# Patient Record
Sex: Female | Born: 1975 | Race: White | Hispanic: No | Marital: Married | Smoking: Former smoker
Health system: Southern US, Community
[De-identification: ages and names within clinical notes are randomized; demographics above are authoritative.]

## PROBLEM LIST (undated history)

## (undated) DIAGNOSIS — R000 Tachycardia, unspecified: Secondary | ICD-10-CM

## (undated) DIAGNOSIS — E785 Hyperlipidemia, unspecified: Secondary | ICD-10-CM

## (undated) DIAGNOSIS — F419 Anxiety disorder, unspecified: Secondary | ICD-10-CM

## (undated) DIAGNOSIS — N201 Calculus of ureter: Secondary | ICD-10-CM

## (undated) DIAGNOSIS — K439 Ventral hernia without obstruction or gangrene: Secondary | ICD-10-CM

## (undated) DIAGNOSIS — Z87442 Personal history of urinary calculi: Secondary | ICD-10-CM

## (undated) HISTORY — DX: Ventral hernia without obstruction or gangrene: K43.9

## (undated) HISTORY — PX: DILATION AND CURETTAGE OF UTERUS: SHX78

## (undated) HISTORY — PX: MANDIBLE SURGERY: SHX707

## (undated) HISTORY — DX: Anxiety disorder, unspecified: F41.9

## (undated) HISTORY — PX: CERVICAL BIOPSY  W/ LOOP ELECTRODE EXCISION: SUR135

## (undated) HISTORY — DX: Tachycardia, unspecified: R00.0

---
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
## 2003-05-28 ENCOUNTER — Ambulatory Visit (HOSPITAL_COMMUNITY): Admission: RE | Admit: 2003-05-28 | Discharge: 2003-05-28 | Payer: Self-pay | Admitting: Obstetrics and Gynecology

## 2003-05-28 ENCOUNTER — Encounter (INDEPENDENT_AMBULATORY_CARE_PROVIDER_SITE_OTHER): Payer: Self-pay | Admitting: *Deleted

## 2003-10-29 ENCOUNTER — Other Ambulatory Visit: Admission: RE | Admit: 2003-10-29 | Discharge: 2003-10-29 | Payer: Self-pay | Admitting: Obstetrics and Gynecology

## 2004-03-03 ENCOUNTER — Other Ambulatory Visit: Admission: RE | Admit: 2004-03-03 | Discharge: 2004-03-03 | Payer: Self-pay | Admitting: Obstetrics and Gynecology

## 2004-06-06 ENCOUNTER — Encounter (INDEPENDENT_AMBULATORY_CARE_PROVIDER_SITE_OTHER): Payer: Self-pay | Admitting: Specialist

## 2004-06-06 ENCOUNTER — Ambulatory Visit (HOSPITAL_COMMUNITY): Admission: RE | Admit: 2004-06-06 | Discharge: 2004-06-06 | Payer: Self-pay | Admitting: Obstetrics and Gynecology

## 2004-07-07 ENCOUNTER — Other Ambulatory Visit: Admission: RE | Admit: 2004-07-07 | Discharge: 2004-07-07 | Payer: Self-pay | Admitting: Obstetrics and Gynecology

## 2004-12-29 ENCOUNTER — Other Ambulatory Visit: Admission: RE | Admit: 2004-12-29 | Discharge: 2004-12-29 | Payer: Self-pay | Admitting: Obstetrics and Gynecology

## 2005-07-20 ENCOUNTER — Ambulatory Visit (HOSPITAL_COMMUNITY): Admission: RE | Admit: 2005-07-20 | Discharge: 2005-07-20 | Payer: Self-pay | Admitting: Obstetrics and Gynecology

## 2005-07-23 ENCOUNTER — Inpatient Hospital Stay (HOSPITAL_COMMUNITY): Admission: AD | Admit: 2005-07-23 | Discharge: 2005-07-24 | Payer: Self-pay | Admitting: Obstetrics and Gynecology

## 2005-10-12 ENCOUNTER — Inpatient Hospital Stay (HOSPITAL_COMMUNITY): Admission: AD | Admit: 2005-10-12 | Discharge: 2005-10-14 | Payer: Self-pay | Admitting: Obstetrics and Gynecology

## 2009-09-18 LAB — CONVERTED CEMR LAB

## 2010-04-21 ENCOUNTER — Ambulatory Visit: Payer: Self-pay | Admitting: Family Medicine

## 2010-04-21 DIAGNOSIS — R000 Tachycardia, unspecified: Secondary | ICD-10-CM

## 2010-04-21 DIAGNOSIS — F411 Generalized anxiety disorder: Secondary | ICD-10-CM | POA: Insufficient documentation

## 2010-04-21 DIAGNOSIS — E785 Hyperlipidemia, unspecified: Secondary | ICD-10-CM | POA: Insufficient documentation

## 2010-04-21 DIAGNOSIS — G4700 Insomnia, unspecified: Secondary | ICD-10-CM | POA: Insufficient documentation

## 2010-04-21 HISTORY — DX: Tachycardia, unspecified: R00.0

## 2010-04-28 ENCOUNTER — Ambulatory Visit: Payer: Self-pay | Admitting: Family Medicine

## 2010-06-07 ENCOUNTER — Encounter: Payer: Self-pay | Admitting: Family Medicine

## 2010-06-20 ENCOUNTER — Ambulatory Visit
Admission: RE | Admit: 2010-06-20 | Discharge: 2010-06-20 | Payer: Self-pay | Source: Home / Self Care | Attending: Family Medicine | Admitting: Family Medicine

## 2010-06-20 NOTE — Assessment & Plan Note (Signed)
Summary: New pt [facility]/dt   Vital Signs:  Patient profile:   35 year old female Menstrual status:  regular LMP:     04/04/2010 Height:      62.75 inches (159.38 cm) Weight:      117.25 pounds (53.30 kg) BMI:     21.01 O2 Sat:      100 % on Room air Temp:     97.7 degrees F (36.50 degrees C) oral Pulse rate:   153 / minute BP sitting:   128 / 84  (right arm) Cuff size:   regular  Vitals Entered By: Josph Macho RMA (April 21, 2010 3:00 PM)  O2 Flow:  Room air  Serial Vital Signs/Assessments:  Time      Position  BP       Pulse  Resp  Temp     By                     134/90                         Danise Edge MD  CC: Establish new patient/ questions about anxiety medication/ CF Is Patient Diabetic? No LMP (date): 04/04/2010     Menstrual Status regular Enter LMP: 04/04/2010 Last PAP Result historical   History of Present Illness: Patient is 35 yo caucasian female in today for new patient appt for evaluation of worsening anxiety disorder. She is accompanied by her husband who confirms her symptoms are worsening. She acknowledges having some level of anxiety she was always been able to manage as a young adult she had hi anxiety but no actual "attacks". She reports the first attack was really in 2003 when her Emelia Loron was hospital ized. She became sweaty, presyncopal, had  tunnel vision, headache and palpitations. No other attack until occur September of 2006. In January 2006 she suffered a miscarriage then in September 2006 she had a pregnancy and had an attack at that time. She delivered a child in May of 2007 and since then she's had intermittent attacks and in the last 2 months they been much more frequent. she reports she'll be she'll tinnitus and anxious. She worries constantly. She has trouble sleeping both falling and staying asleep. With these episodes she deniesrtness of breath, chest pain, nausea, paresthesias. Does have a feeling of her health heart pounding  racing, has never had a syncopal episode but feels as if her vision is graying and she could pass out. Often feels diaphoretic. She reports otherwise her health is good she's not had any recent febrile illness, congestion, cough, GI or GU concerns.  Preventive Screening-Counseling & Management  Alcohol-Tobacco     Smoking Status: quit  Caffeine-Diet-Exercise     Does Patient Exercise: no  Problems Prior to Update: 1)  Tachycardia  (ICD-785.0) 2)  Hyperlipidemia  (ICD-272.4) 3)  Anxiety Disorder  (ICD-300.00) 4)  Insomnia  (ICD-780.52)  Current Problems (verified): 1)  Tachycardia  (ICD-785.0) 2)  Hyperlipidemia  (ICD-272.4) 3)  Anxiety Disorder  (ICD-300.00) 4)  Insomnia  (ICD-780.52)  Medications Prior to Update: 1)  None  Current Medications (verified): 1)  Ortho Tri-Cyclen (28) 0.18/0.215/0.25 Mg-35 Mcg Tabs (Norgestim-Eth Estrad Triphasic) .... Once Daily 2)  Multi Vitamin .... Once Daily 3)  Flax Seed Oil .... Once Daily 4)  Fish Oil .... Once Daily  Allergies (verified): 1)  ! 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 Quit heavy smoking in 2006 Alcohol use-yes, rare, special occasions Regular exercise-no Dietary, no restrictions Use seat belt regularlyOccupation:  employed Smoking Status:  quit Does Patient Exercise:  no  Review of Systems  The patient denies anorexia, fever, weight loss, weight gain, vision loss, decreased hearing, hoarseness, chest pain, syncope, dyspnea on exertion, peripheral edema, prolonged cough, headaches, hemoptysis, abdominal pain, melena, hematochezia, severe indigestion/heartburn, hematuria, incontinence, muscle weakness, suspicious skin lesions, transient blindness, difficulty walking, depression, unusual weight change, abnormal bleeding, and enlarged lymph nodes.    Physical Exam  General:  Well-developed,well-nourished,in no acute distress; alert,appropriate and cooperative throughout examination Head:  Normocephalic and atraumatic without obvious abnormalities. No apparent alopecia or balding. Eyes:  No corneal or conjunctival inflammation noted. EOMI. Perrla. Funduscopic exam benign, without hemorrhages, exudates or papilledema. Vision grossly normal. Ears:  External ear exam shows no significant lesions or deformities.  Otoscopic examination reveals clear canals, tympanic membranes are intact bilaterally without bulging, retraction, inflammation or discharge. Hearing is grossly normal bilaterally. Nose:  External nasal examination shows no deformity or inflammation. Nasal mucosa are pink and moist without lesions or exudates. Mouth:  Oral mucosa and  oropharynx without lesions or exudates.  Teeth in good repair. Neck:  No deformities, masses, or tenderness noted. Lungs:  Normal respiratory effort, chest expands symmetrically. Lungs are clear to auscultation, no crackles or wheezes. Heart:  Normal rate and regular rhythm. S1 and S2 normal without gallop, murmur, click, rub or other extra sounds. Abdomen:  Bowel sounds positive,abdomen soft and non-tender without masses, organomegaly or hernias noted. Msk:  No deformity or scoliosis noted of thoracic or lumbar spine.   Pulses:  R and L carotid,radial,femoral,dorsalis pedis and posterior tibial pulses are full and equal bilaterally Extremities:  No clubbing, cyanosis, edema, or deformity noted  Neurologic:  No cranial nerve deficits noted. Station and gait are normal. Plantar reflexes are down-going bilaterally. DTRs are symmetrical throughout. Sensory, motor and coordinative functions appear intact. Skin:  Intact without suspicious lesions or rashes Cervical Nodes:  No lymphadenopathy noted Psych:  Cognition and judgment appear intact. Alert and cooperative with normal attention span and concentration. No apparent delusions, illusions, hallucinations, moderately anxious.     Impression & Recommendations:  Problem # 1:  TACHYCARDIA (ICD-785.0)  Orders: T-Basic Metabolic Panel ([id number]) T-CBC No Diff ([id number]) T-TSH ([id number]) Start Metoprolol Succinate 25mg  by mouth daily  Problem # 2:  ANXIETY DISORDER (ICD-300.00)  Her updated medication list for this problem includes:  Alprazolam 0.25 Mg Tabs (Alprazolam) .[name] Kitchen... 1/2 to 1 tab by mouth two times a day as needed anxiety Will start Alprazolam 0.25 mg by mouth as needed for now and reeval next week. Patient encouraged to consider SSRI and behavioral modification.  Problem # 3:  INSOMNIA (ICD-780.52) Encouraged to try Benadryl and may use Alprazolam as needed for intense episodes  Problem # 4:  HYPERLIPIDEMIA  (ICD-272.4)  Orders: T-Lipid Profile ([id number]) T-Hepatic Function ([id number]) Will reeval after labs available.  Complete Medication List: 1)  Ortho Tri-cyclen (28) 0.18/0.215/0.25 Mg-35 Mcg Tabs (Norgestim-eth estrad triphasic) .... Once daily 2)  Multi Vitamin  .... Once daily 3)  Flax Seed Oil  .... Once daily 4)  Fish Oil  .... Once daily 5)  Alprazolam 0.25 Mg Tabs (Alprazolam) .... 1/2 to 1 tab by mouth two times a day as needed anxiety 6)  Metoprolol Succinate 25 Mg Xr24h-tab (Metoprolol succinate) .[name] Kitchen.. 1 tab by mouth daily  Patient Instructions: 1)  Please schedule a follow-up appointment in 1 weeks 2)  Avoid caffeine 3)  Try Benadryl as needed for sleep. Prescriptions: METOPROLOL SUCCINATE 25 MG XR24H-TAB (METOPROLOL SUCCINATE) 1 tab by mouth daily  #30 x 1   Entered and Authorized by:   Danise Edge MD   Signed by:   Danise Edge MD on 04/21/2010   Method used:   Electronically to        [facility]  [address] [phone][facility] (retail)       [address]       [place], [place]  [address]       Ph: [phone] or [phone]       Fax: [phone]   RxID:   [id number] ALPRAZOLAM 0.25 MG TABS (ALPRAZOLAM) 1/2 to 1 tab by mouth two times a day as needed anxiety  #30 x 1   Entered and Authorized by:   Danise Edge MD   Signed by:   Danise Edge MD on 04/21/2010   Method used:   Print then Give to Patient   RxID:   [id number]    Orders Added: 1)  T-Basic Metabolic Panel [[id number]] 2)  T-Lipid Profile [54627-03500] 3)  T-Hepatic Function [[id number]] 4)  T-CBC No Diff [[id number]] 5)  T-TSH [[id number]] 6)  New Patient Level IV [27782]    Preventive Care Screening  Pap Smear:    Date:  09/18/2009    Results:  historical

## 2010-06-20 NOTE — Assessment & Plan Note (Signed)
Summary: 1 week fu/dt   Vital Signs:  Patient profile:   35 year old female Menstrual status:  regular Height:      62.75 inches (159.38 cm) Weight:      118.50 pounds (53.86 kg) O2 Sat:      98 % on Room air Temp:     97.7 degrees F (36.50 degrees C) oral Pulse rate:   136 / minute BP sitting:   136 / 81  (right arm) Cuff size:   regular  Vitals Entered By: Josph Macho RMA (April 28, 2010 3:26 PM)  O2 Flow:  Room air  CC: 1 week follow up/ CF Is Patient Diabetic? No   History of Present Illness: Patient is in today for reevaluation of tachycardia and anxiety. She reports she is doing much better on the Metoprolol. She has not had any panic or anxiety attacks this week. She took 1/2 of a Xanax during th week for a stressful situation but she was not having the palpitations at that time. She has not had any palp/sob/cp/tremulousness and she does feel a little calmer on the Metoprolol. Denies any SE such as constipation or fatigue. On the way here she acknowledges she could feel her anxiety level and heart rate pick up secondary to her worry about what her Vital signs would be when she got here. No recent illness/f/c/GI or GU c/o.  Current Medications (verified): 1)  Ortho Tri-Cyclen (28) 0.18/0.215/0.25 Mg-35 Mcg Tabs (Norgestim-Eth Estrad Triphasic) .... Once Daily 2)  Multi Vitamin .... Once Daily 3)  Flax Seed Oil .... Once Daily 4)  Fish Oil .... Once Daily 5)  Alprazolam 0.25 Mg Tabs (Alprazolam) .... 1/2 To 1 Tab By Mouth Two Times A Day As Needed Anxiety 6)  Metoprolol Succinate 25 Mg Xr24h-Tab (Metoprolol Succinate) .[name] Kitchen.. 1 Tab By Mouth Daily  Allergies (verified): 1)  ! Sulfa  Past History:  Past medical history reviewed for relevance to current acute and chronic problems. Social history (including risk factors) reviewed for relevance to current acute and chronic problems.  Social History: Reviewed history from 04/21/2010 and no changes required. 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 Quit heavy smoking in 2006 Alcohol use-yes, rare, special occasions Regular exercise-no Dietary, no restrictions Use seat belt regularly  Review of Systems      See HPI  Physical Exam  General:  Well-developed,well-nourished,in no acute distress; alert,appropriate and cooperative throughout examination Head:  Normocephalic and atraumatic without obvious abnormalities. No apparent alopecia or balding. Ears:  External ear exam shows no significant lesions or deformities.   Nose:  External nasal examination shows no deformity or inflammation. Nasal mucosa are pink and moist without lesions or exudates. Mouth:  Oral mucosa and oropharynx without lesions or exudates.  Teeth in good repair. Neck:  No deformities, masses, or tenderness noted. Lungs:  Normal respiratory effort, chest expands symmetrically. Lungs are clear to auscultation, no crackles or wheezes. Heart:  Normal rate and regular rhythm. S1 and S2 normal without gallop, murmur, click, rub or other extra sounds. Abdomen:  Bowel sounds positive,abdomen soft and non-tender without masses, organomegaly or hernias noted. Msk:  No deformity or scoliosis noted of thoracic or lumbar spine.   Pulses:  R and L carotid,radial,femoral,dorsalis pedis and posterior tibial pulses are full and equal bilaterally Extremities:  No clubbing, cyanosis, edema, or deformity noted  Cervical Nodes:  No lymphadenopathy noted Psych:  Cognition and judgment appear intact.  Alert and cooperative with normal attention span and concentration. No apparent delusions, illusions, hallucinations   Impression & Recommendations:  Problem # 1:  ANXIETY DISORDER (ICD-300.00)  Her updated medication list for this problem includes:    Alprazolam 0.25 Mg Tabs (Alprazolam) .[name] Kitchen... 1/2 to 1 tab by mouth two times a day as needed anxiety    Sertraline Hcl 50 Mg Tabs (Sertraline  hcl) .[name] Kitchen... 1/2 tab by mouth daily x 7 days then increase to 1 tab daily Patient will call with any concerns or return in 6 weeks for further evaluation  Problem # 2:  TACHYCARDIA (ICD-785.0) Improved on Metoprolol will not increase dosing today and will continue to monitor  Complete Medication List: 1)  Ortho Tri-cyclen (28) 0.18/0.215/0.25 Mg-35 Mcg Tabs (Norgestim-eth estrad triphasic) .... Once daily 2)  Multi Vitamin  .... Once daily 3)  Flax Seed Oil  .... Once daily 4)  Fish Oil  .... Once daily 5)  Alprazolam 0.25 Mg Tabs (Alprazolam) .... 1/2 to 1 tab by mouth two times a day as needed anxiety 6)  Metoprolol Succinate 25 Mg Xr24h-tab (Metoprolol succinate) .[name] Kitchen.. 1 tab by mouth daily 7)  Sertraline Hcl 50 Mg Tabs (Sertraline hcl) .... 1/2 tab by mouth daily x 7 days then increase to 1 tab daily  Patient Instructions: 1)  Please schedule a follow-up appointment in 6-8 weeks.  2)  Call with any concerns. Prescriptions: METOPROLOL SUCCINATE 25 MG XR24H-TAB (METOPROLOL SUCCINATE) 1 tab by mouth daily  #30 x 2   Entered and Authorized by:   Danise Edge MD   Signed by:   Danise Edge MD on 04/28/2010   Method used:   [facility] to        [facility]  [address] [phone][facility]       [address] [place], [place]  [address]       Ph: [phone] or [phone]       Fax: [phone]   RxID:   [id number] SERTRALINE HCL 50 MG TABS (SERTRALINE HCL) 1/2 tab by mouth daily x 7 days then increase to 1 tab daily  #30 x 2   Entered and Authorized by:   Danise Edge MD   Signed by:   Danise Edge MD on 04/28/2010   Method used:   [facility]  [address] [phone][facility]       [address] [place], [place]  [address]       Ph: [phone] or [phone]       Fax: [phone]   RxID:   [id number]

## 2010-06-28 NOTE — Assessment & Plan Note (Signed)
Summary: 6 week fu/dt   Vital Signs:  Patient profile:   35 year old female Menstrual status:  regular Height:      62.75 inches (159.38 cm) Weight:      121.50 pounds (55.23 kg) O2 Sat:      100 % on Room air Temp:     98.1 degrees F (36.72 degrees C) oral Pulse rate:   100 / minute BP sitting:   123 / 81  (right arm) Cuff size:   regular  Vitals Entered By: Josph Macho RMA (June 20, 2010 3:30 PM)  O2 Flow:  Room air  Serial Vital Signs/Assessments:  Time      Position  BP       Pulse  Resp  Temp     By                     101/62   90                    Danise Edge MD  CC: 6 week follow up/ CF Is Patient Diabetic? No   History of Present Illness: Patient is a 35 yo Caucasian female in today for follow up on tachycardia and anxiety. She reports feeling much better on this combination of medications. She feels much less anxious and is able to tolerate situations that would have made her very anxious and tachycardic in the past much better now. She had no anxiety coming in here today. She is happier and able to concentrate better now. She does still struggle with difficulty sleeping at times. Both falling asleep and staying asleep. No recent illness/congestion/fevers/chills/fatigue/CP/palp/SOB/GI or GU concerns.  Current Medications (verified): 1)  Ortho Tri-Cyclen (28) 0.18/0.215/0.25 Mg-35 Mcg Tabs (Norgestim-Eth Estrad Triphasic) .... Once Daily 2)  Multi Vitamin .... Once Daily 3)  Flax Seed Oil .... Once Daily 4)  Fish Oil .... Once Daily 5)  Alprazolam 0.25 Mg Tabs (Alprazolam) .... 1/2 To 1 Tab By Mouth Two Times A Day As Needed Anxiety 6)  Metoprolol Succinate 25 Mg Xr24h-Tab (Metoprolol Succinate) .[name] Kitchen.. 1 Tab By Mouth Daily 7)  Sertraline Hcl 50 Mg Tabs (Sertraline Hcl) .... 1/2 Tab By Mouth Daily X 7 Days Then Increase To 1 Tab Daily  Allergies (verified): 1)  ! Sulfa  Past History:  Past medical history reviewed for relevance to current acute and chronic  problems. Social history (including risk factors) reviewed for relevance to current acute and chronic problems.  Social History: Reviewed history from 04/21/2010 and no changes required. 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 Quit heavy smoking in 2006 Alcohol use-yes, rare, special occasions Regular exercise-no Dietary, no restrictions Use seat belt regularly  Review of Systems      See HPI  Physical Exam  General:  Well-developed,well-nourished,in no acute distress; alert,appropriate and cooperative throughout examination Head:  Normocephalic and atraumatic without obvious abnormalities. No apparent alopecia or balding. Nose:  External nasal examination shows no deformity or inflammation. Nasal mucosa are pink and moist without lesions or exudates. Mouth:  Oral mucosa and oropharynx without lesions or exudates.  Teeth in good repair. Neck:  No deformities, masses, or tenderness noted. Lungs:  Normal respiratory effort, chest expands symmetrically. Lungs are clear to auscultation, no crackles or wheezes.no crackles.   Heart:  Normal rate and regular rhythm. S1 and S2 normal without gallop, murmur, click, rub or other extra sounds. Abdomen:  Bowel sounds  positive,abdomen soft and non-tender without masses, organomegaly or hernias noted. Extremities:  No clubbing, cyanosis, edema, or deformity noted with normal full range of motion of all joints.   Cervical Nodes:  No lymphadenopathy noted Psych:  Cognition and judgment appear intact. Alert and cooperative with normal attention span and concentration. No apparent delusions, illusions, hallucinations   Impression & Recommendations:  Problem # 1:  TACHYCARDIA (ICD-785.0) Greatly improved on the combination of Sertraline and Metoprolol,  no change in therapy today.   Problem # 2:  ANXIETY DISORDER (ICD-300.00)  Her updated medication list for this problem  includes:    Alprazolam 0.25 Mg Tabs (Alprazolam) .[name] Kitchen... 1/2 to 1 tab by mouth two times a day as needed anxiety    Sertraline Hcl 50 Mg Tabs (Sertraline hcl) .[name] Kitchen... 1/2 tab by mouth daily x 7 days then increase to 1 tab daily Doing much better, reports no panic attacks and she notes being much less anxious while coming in here today. She may use he rAlprazolam sparingly for nights she has excessive trouble sleeping.   Complete Medication List: 1)  Ortho Tri-cyclen (28) 0.18/0.215/0.25 Mg-35 Mcg Tabs (Norgestim-eth estrad triphasic) .... Once daily 2)  Multi Vitamin  .... Once daily 3)  Flax Seed Oil  .... Once daily 4)  Fish Oil  .... Once daily 5)  Alprazolam 0.25 Mg Tabs (Alprazolam) .... 1/2 to 1 tab by mouth two times a day as needed anxiety 6)  Metoprolol Succinate 25 Mg Xr24h-tab (Metoprolol succinate) .[name] Kitchen.. 1 tab by mouth daily 7)  Sertraline Hcl 50 Mg Tabs (Sertraline hcl) .... 1/2 tab by mouth daily x 7 days then increase to 1 tab daily  Patient Instructions: 1)  Please schedule a follow-up appointment in 2 to 3 months.  2)  Call if any concerns. 3)  If sweating persists try to check BP upon awakening, if lower than 110/60, call Prescriptions: METOPROLOL SUCCINATE 25 MG XR24H-TAB (METOPROLOL SUCCINATE) 1 tab by mouth daily  #30 x 2   Entered and Authorized by:   Danise Edge MD   Signed by:   Danise Edge MD on 06/20/2010   Method used:   [facility] to        [facility]  [address] [phone]* (retail)       [address] [place], [place]  [address]       Ph: [phone] or [phone]       Fax: [phone]   RxID:   [id number]    Orders Added: 1)  Est. Patient Level III [[address]]

## 2010-06-29 ENCOUNTER — Encounter: Payer: Self-pay | Admitting: Family Medicine

## 2010-07-05 ENCOUNTER — Encounter: Payer: Self-pay | Admitting: *Deleted

## 2010-07-31 ENCOUNTER — Encounter: Payer: Self-pay | Admitting: Family Medicine

## 2010-08-29 ENCOUNTER — Ambulatory Visit: Payer: Self-pay | Admitting: Family Medicine

## 2010-09-05 ENCOUNTER — Encounter: Payer: Self-pay | Admitting: Family Medicine

## 2010-09-05 ENCOUNTER — Ambulatory Visit (INDEPENDENT_AMBULATORY_CARE_PROVIDER_SITE_OTHER): Payer: BLUE CROSS/BLUE SHIELD | Admitting: Family Medicine

## 2010-09-05 DIAGNOSIS — F411 Generalized anxiety disorder: Secondary | ICD-10-CM

## 2010-09-05 DIAGNOSIS — G4700 Insomnia, unspecified: Secondary | ICD-10-CM

## 2010-09-05 DIAGNOSIS — E785 Hyperlipidemia, unspecified: Secondary | ICD-10-CM

## 2010-09-05 DIAGNOSIS — F419 Anxiety disorder, unspecified: Secondary | ICD-10-CM

## 2010-09-05 DIAGNOSIS — R000 Tachycardia, unspecified: Secondary | ICD-10-CM

## 2010-09-05 MED ORDER — METOPROLOL SUCCINATE ER 25 MG PO TB24: 25.0000 mg | ORAL_TABLET | Freq: Every day | ORAL | Status: DC

## 2010-09-05 MED ORDER — SERTRALINE HCL 50 MG PO TABS: 50.0000 mg | ORAL_TABLET | Freq: Every day | ORAL | Status: DC

## 2010-09-05 MED ORDER — ALPRAZOLAM 0.25 MG PO TABS: 0.2500 mg | ORAL_TABLET | ORAL | Status: DC | PRN

## 2010-09-05 NOTE — Assessment & Plan Note (Signed)
This continues to plague her. She does use Alprazolam ocassionally for this purpose and it does help. They do have a 70 week old puppy and that has been disrupting her sleep lately too. She will try some melatonin OTC, if no response may consider Benadryl or Hyland's Calms Forte and she already trying to maintain good sleep hygiene. May use Alprazolam infrequently

## 2010-09-05 NOTE — Assessment & Plan Note (Signed)
Improved with blood draw in past year, encouraged her to continue to avoid trans fats and maintain a healthy diet and exercise regimen, recheck every 1-2 years unless something changes

## 2010-09-05 NOTE — Patient Instructions (Signed)
Anxiety and Panic Attacks Your caregiver has informed you that you are having an anxiety or panic attack. There may be many forms of this. Most of the time these attacks come suddenly and without warning. They come at any time of day, including periods of sleep, and at any time of life. They may be strong and unexplained. Although panic attacks are very scary, they are physically harmless. Sometimes the cause of your anxiety is not known. Anxiety is a protective mechanism of the body in its fight or flight mechanism. Most of these perceived danger situations are actually nonphysical situations (such as anxiety over losing a job). CAUSES The causes of an anxiety or panic attack are many. Panic attacks may occur in otherwise healthy people given a certain set of circumstances. There may be a genetic cause for panic attacks. Some medications may also have anxiety as a side effect. SYMPTOMS Some of the most common feelings are:  Intense terror.  Dizziness, feeling faint.   Hot and cold flashes.   Fear of going crazy.   Feelings that nothing is real.   Sweating.   Shaking.   Chest pain or a fast heartbeat (palpitations).  Smothering, choking sensations.   Feelings of impending doom and that death is near.   Tingling of extremities, this may be from over breathing.   Altered reality (derealization).   Being detached from yourself (depersonalization).   Several symptoms can be present to make up anxiety or panic attacks. DIAGNOSIS The evaluation by your caregiver will depend on the type of symptoms you are experiencing. The diagnosis of anxiety or pain attack is made when no physical illness can be determined to be a cause of the symptoms. TREATMENT Treatment to prevent anxiety and panic attacks may include:  Avoidance of circumstances that cause anxiety.   Reassurance and relaxation.   Regular exercise.   Relaxation therapies, such as yoga.   Psychotherapy with a psychiatrist  or therapist.   Avoidance of caffeine, alcohol and illegal drugs.   Prescribed medication.  SEEK IMMEDIATE MEDICAL CARE IF:  You experience panic attack symptoms that are different than your usual symptoms.   You have any worsening or concerning symptoms.  Document Released: 05/07/2005 Document Re-Released: 10/25/2009 [employer] Patient Information 2011 [employer]. 

## 2010-09-05 NOTE — Progress Notes (Signed)
Vickie Bennett [id number] 1975/10/23 09/05/2010      Progress Note-Follow Up  Subjective  Chief Complaint  Chief Complaint  Patient presents with  . Anxiety    2 month follow up    HPI  Patient is a 35 year old Caucasian female in today for evaluation of tachycardia and anxiety. She reports feeling very well and having no difficulties with anxiety or panic attacks since her last visit. She has been taking Propulsid medication at her local pharmacy and find the numbers in the 60-90 range on average. She denies any palpitations, chest pain, shortness of breath or panic attacks. She denies any recent illness, fevers, URI or GI concerns at this time. She feels the combination of her medications is working well and she is not acuity changes. Her only concern is of persistent poor sleep. Of note she has recently gotten a young puppy and that is affecting her sleep as well she uses her alprazolam at times to help her sleep and that is somewhat useful. She finds she has trouble falling asleep and staying asleep on most nights. She has not tried any over-the-counter medications to date. She does try and maintain a quiet dark room and avoids stimulants in the evenings.  Past Medical History  Diagnosis Date  . Anxiety   . Hypertension   . TACHYCARDIA 04/21/2010  . INSOMNIA 04/21/2010  . HYPERLIPIDEMIA 04/21/2010  . ANXIETY DISORDER 04/21/2010    Past Surgical History  Procedure Date  . Cesarean section 2007  . Leep 2005  . Dilation and curettage of uterus     incomplete miscarriage  . 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 Hypertension Father   . Arthritis Father   . Migraines Mother   . Interstitial cystitis Mother   . Ulcers Maternal Grandmother     Peptic  . Anemia Maternal Grandmother   . Heart disease Maternal Grandfather   . Heart disease Paternal Grandfather   . Alzheimer's disease Paternal Grandmother   .  Other Paternal Grandmother     Possible PE    History   Social History  . Marital Status: Single    Spouse Name: N/A    Number of Children: N/A  . Years of Education: N/A   Occupational History  . Not on file.   Social History Main Topics  . Smoking status: Former Smoker    Quit date: 02/05/2009  . Smokeless tobacco: Never Used  . Alcohol Use: 0.0 oz/week    0 drink(s) per week     occasionaly/ very rare  . Drug Use: No  . Sexually Active: Yes -- Female partner(s)   Other Topics Concern  . Not on file   Social History Narrative  . No narrative on file    Current Outpatient Prescriptions on File Prior to Visit  Medication Sig Dispense Refill  . Multiple Vitamin (MULTIVITAMIN) tablet Take 1 tablet by mouth daily.        . norethindrone-ethinyl estradiol (TRIPHASIL) 0.5/0.75/1-35 MG-MCG per tablet Take 1 tablet by mouth daily.        [name] Kitchen DISCONTD: ALPRAZolam (XANAX) 0.25 MG tablet Take 0.25 mg by mouth as needed. 1/2 to 1 tab  For anxiety       . DISCONTD: metoprolol (TOPROL-XL) 25 MG 24 hr tablet Take 25 mg by mouth daily.        [name] Kitchen DISCONTD: sertraline (ZOLOFT) 50  MG tablet Take 50 mg by mouth daily.        . fish oil-omega-3 fatty acids 1000 MG capsule Take 1,000 mg by mouth daily.        . Flaxseed, Linseed, (FLAX SEED OIL PO) Take by mouth daily.          Allergies  Allergen Reactions  . Sulfonamide Derivatives     Review of Systems  Review of Systems  Constitutional: Negative for fever and malaise/fatigue.  HENT: Negative for congestion.   Eyes: Negative for discharge.  Respiratory: Negative for shortness of breath.   Cardiovascular: Negative for chest pain, palpitations and leg swelling.  Gastrointestinal: Negative for nausea, abdominal pain and diarrhea.  Genitourinary: Negative for dysuria.  Musculoskeletal: Negative for falls.  Skin: Negative for rash.  Neurological: Negative for loss of consciousness and headaches.  Endo/Heme/Allergies: Negative for  polydipsia.  Psychiatric/Behavioral: Negative for depression and suicidal ideas. The patient is not nervous/anxious and does not have insomnia.     Objective  BP 117/77  Pulse 96  Temp(Src) 98 F (36.7 C) (Oral)  Ht 5' 2.75" (1.594 m)  Wt 120 lb 12.8 oz (54.795 kg)  BMI 21.57 kg/m2  SpO2 99%  LMP 08/26/2010  Physical Exam  Physical Exam  Constitutional: She is oriented to person, place, and time and well-developed, well-nourished, and in no distress. No distress.  HENT:  Head: Normocephalic and atraumatic.  Eyes: Conjunctivae are normal.  Neck: Neck supple. No thyromegaly present.  Cardiovascular: Normal rate, regular rhythm and normal heart sounds.   No murmur heard. Pulmonary/Chest: Effort normal and breath sounds normal. She has no wheezes.  Abdominal: She exhibits no distension and no mass.  Musculoskeletal: She exhibits no edema.  Lymphadenopathy:    She has no cervical adenopathy.  Neurological: She is alert and oriented to person, place, and time.  Skin: Skin is warm and dry. No rash noted. She is not diaphoretic.  Psychiatric: Memory, affect and judgment normal.     Assessment & Plan  TACHYCARDIA Greatly improved on the combination of Sertraline and Metoprolol. She has even been checking her pulse occasionally at the pharmacy and seeing pulses ranging form 60s to 90s.   INSOMNIA This continues to plague her. She does use Alprazolam ocassionally for this purpose and it does help. They do have a 19 week old puppy and that has been disrupting her sleep lately too. She will try some melatonin OTC, if no response may consider Benadryl or Hyland's Calms Forte and she already trying to maintain good sleep hygiene. May use Alprazolam infrequently  HYPERLIPIDEMIA Improved with blood draw in past year, encouraged her to continue to avoid trans fats and maintain a healthy diet and exercise regimen, recheck every 1-2 years unless something changes  ANXIETY  DISORDER Patient feels much better on current meds. She reports she is no longer having any panic or anxiety attacks. She feels much happier and in control of her reactions to her surroundings. No change to medications today, refills given

## 2010-09-05 NOTE — Assessment & Plan Note (Signed)
Greatly improved on the combination of Sertraline and Metoprolol. She has even been checking her pulse occasionally at the pharmacy and seeing pulses ranging form 60s to 90s.

## 2010-09-05 NOTE — Assessment & Plan Note (Signed)
Patient feels much better on current meds. She reports she is no longer having any panic or anxiety attacks. She feels much happier and in control of her reactions to her surroundings. No change to medications today, refills given

## 2010-10-06 NOTE — Op Note (Signed)
NAME:  Vickie Bennett, Vickie Bennett                        ACCOUNT NO.:  [id number]   MEDICAL RECORD NO.:  [id number]                   PATIENT TYPE:  AMB   LOCATION:  SDC                                  FACILITY:  [facility]   PHYSICIAN:  Randye Lobo, M.D.                DATE OF BIRTH:  [birth year]   DATE OF PROCEDURE:  05/28/2003  DATE OF DISCHARGE:                                 OPERATIVE REPORT   PREOPERATIVE DIAGNOSES:  Cervical intraepithelial neoplasia II.   POSTOPERATIVE DIAGNOSIS:  Cervical intraepithelial neoplasia II.   PROCEDURE:  Loop electrosurgical excision procedure.   SURGEON:  Randye Lobo, M.D.   ANESTHESIA:  MAC, local with 1% lidocaine with 1:200,000 of epinephrine.   ESTIMATED BLOOD LOSS:  Minimal.   INDICATION FOR PROCEDURE:  The patient is a 35 year old gravida 39 Caucasian  female who presented with an abnormal Pap smear on March 19, 2003,  demonstrating high-grade squamous intraepithelial lesion.  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  A  discussion was held with the patient regarding her diagnosis and options for  care were discussed, and a decision was made to proceed with a LEEP  procedure after risks, benefits, and alternatives were reviewed.   FINDINGS:  Examination under anesthesia revealed no gross lesions of the  cervix or vagina or perineum.  The uterus was noted to be small, and there  were no adnexal masses appreciated.   DESCRIPTION OF PROCEDURE:  The patient was re-identified in the preoperative  hold area and she was taken to the operating room, where MAC anesthesia was  induced.  The patient was placed in the dorsal lithotomy position, and an  exam under anesthesia was performed.   A speculum was placed inside the vagina, and the cervix was first saturated  with acetic acid solution, followed by Monsel's solution.  The  transformation  zone was noted to be small.  The cervix was then  circumferentially injected with a total of 10 mL of 1% lidocaine with  1:200,000 of epinephrine.  With a cutting setting of 60, the LEEP procedure  was performed in one pass.  A smaller square-shaped loop was used to perform  the endocervical pass similarly using the cutting setting with the monopolar  cautery.  Cautery was then used to cauterize the perimeter and edges of the  surgical site.  The endocervix was not cauterized.  Monsel's was placed over  the surgical site.  Hemostasis was excellent.  The speculum was removed.   The patient was awakened and escorted to the recovery room in stable and  awake condition.  There were no complications to the procedure.  All needle,  instrument, and sponge counts were correct.  Randye Lobo, M.D.    BES/MEDQ  D:  05/28/2003  T:  05/28/2003  Job:  [id number]

## 2010-10-06 NOTE — Op Note (Signed)
Vickie Bennett, DINNING NO.:  [id number]   MEDICAL RECORD NO.:  [id number]          PATIENT TYPE:  AMB   LOCATION:  SDC                           FACILITY:  [facility]   PHYSICIAN:  Miguel Aschoff, M.D.       DATE OF BIRTH:  Jan 15, 1976   DATE OF PROCEDURE:  06/06/2004  DATE OF DISCHARGE:                                 OPERATIVE REPORT   PREOPERATIVE DIAGNOSIS:  Missed abortion.   POSTOPERATIVE DIAGNOSIS:  Missed abortion.   PROCEDURE:  Suction, dilatation, and curettage.   SURGEON:  Miguel Aschoff, M.D.   ANESTHESIA:  IV sedation with paracervical block.   COMPLICATIONS:  None.   IDENTIFICATION:  The patient is a 35 year old white female, gravida 1, para  0, with last menstrual period of 03/21/04.  The patient presented to the  office on this date presenting with vaginal bleeding and an ultrasound was  noted to have a fetal pole with no evidence of fetal heart activity.  The  diagnosis of missed abortion was made and the patient is now being taken to  the operating room to undergo evacuation of the uterus.  Risks and benefits  of the procedure were discussed with the patient.   PROCEDURE:  The patient was taken to the operating room and placed in the  supine position.  IV sedation was administered without difficulty.  She was  then placed in the dorsal lithotomy position, prepped and draped in the  usual sterile fashion.  Once this was done, examination was carried out and  showed the uterus to be retroflexed, approximately 9 weeks equivalent in  size.  A speculum was then placed in the vaginal vault.  The anterior  cervical lip was grasped with the tenaculum and then the cervix was injected  with 18 cc of 1% Xylocaine by placing 6 cc in the 12, 4, and 8 o'clock  positions.  Using serial Pratt dilators, the endocervical canal was dilated  until a #25 Pratt dilator could be passed.  Then using a #8 vacuum curette,  the contents of the uterine cavity were evacuated without  difficulty until  no further products of conception were returned.  At this point, the  procedure was concluded.  All instruments were removed.  There was excellent  hemostasis.  At this point, the patient was taken out of the lithotomy  position and brought to the recovery room in satisfactory condition.  Estimated blood loss was approximately 60 cc.   Plan is for the patient to be discharged home.   MEDICATIONS FOR HOME:  1.  Doxycycline 100 mg twice a day x3 days.  2. Darvocet-N 100 1 every 4      hours as needed for pain.   FOLLOW UP:  The patient will be seen back in 4 weeks for followup  examination.  She is Rh negative and is to receive a microgram of RhoGAM  prior to discharge.      AR/MEDQ  D:  06/06/2004  T:  06/06/2004  Job:  [id number]

## 2010-10-06 NOTE — Op Note (Signed)
NAMEJEMILA, [name] NO.:  [id number]   MEDICAL RECORD NO.:  [id number]          PATIENT TYPE:  INP   LOCATION:  9139                          FACILITY:  [facility]   PHYSICIAN:  Malva Limes, M.D.    DATE OF BIRTH:  [birth year]   DATE OF PROCEDURE:  10/12/2005  DATE OF DISCHARGE:                                 OPERATIVE REPORT   PREOPERATIVE DIAGNOSES:  1.  Intrauterine pregnancy at term.  2.  Homero Fellers breech presentation.  3.  Active labor.   POSTOPERATIVE DIAGNOSES:  1.  Intrauterine pregnancy at term.  2.  Homero Fellers breech presentation.  3.  Active labor.   PROCEDURE:  Primary low transverse cesarean section.   SURGEON:  Malva Limes, M.D.   ANESTHESIA:  Spinal.   ANTIBIOTICS:  Ancef 1 g.   DRAINS:  Foley bedside drainage   ESTIMATED BLOOD LOSS:  900 mL.   SPECIMENS:  None.   COMPLICATIONS:  A superficial laceration was made on the infant's left hip  area which required no treatment.  The patient and husband were made aware  this procedure.   PROCEDURE:  The patient was taken to the operating room where she was placed  in the dorsal supine position after a spinal anesthetic was administered.  Once an adequate level was reached, the patient was prepped with Betadine  and draped in the usual fashion for this procedure.  A Pfannenstiel incision  was made.  This was carried down to fascia.  The fascia was entered and  extended laterally with Mayo scissors.  Rectus muscles were then dissected  from the fascia with the Bovie.  Rectus muscles were divided in the midline  and taken superiorly and inferiorly.  The parietal peritoneum was entered  and taken superiorly and inferiorly.  The bladder flap was taken down  sharply.  A low transverse uterine incision was made in the midline and  extended laterally.  Amniotic fluid was noted to be clear.  The infant was  delivered in the breech presentation.  Upon delivery of the head, the  oropharynx and nostrils were  bulb suctioned and the cord was doubly clamped  and cut and the infant handed to waiting NICU team.  Cord blood was then  obtained.  The placenta was manually removed.  The uterus was exteriorized  and examined which appeared to be normal.  The uterine cavity was then wiped  with a wet lap.  The uterine incision was closed in a single layer of 0  Monocryl in a running locking fashion.  Bladder flap was closed using 2-0  Monocryl in a running fashion.  The uterus was placed back in the abdominal  cavity.  Hemostasis was again checked and found to be adequate.  The  parietal peritoneum and rectus muscles were approximated in the midline  using 0 Monocryl suture in running fashion.  Fascia was closed using 0  Monocryl suture in running fashion.  Subcuticular tissue was made hemostatic  with Bovie.  Stainless steel clips were used to close the skin.  The patient  tolerated the procedure well.  She  was taken to recovery room in stable  condition.  Instrument and lap counts were correct x2.           ______________________________  Malva Limes, M.D.     MA/MEDQ  D:  10/12/2005  T:  10/12/2005  Job:  [id number]

## 2010-10-06 NOTE — Discharge Summary (Signed)
Vickie Bennett, EDGE NO.:  [id number]   MEDICAL RECORD NO.:  [id number]          PATIENT TYPE:  INP   LOCATION:  9139                          FACILITY:  [facility]   PHYSICIAN:  Malva Limes, M.D.    DATE OF BIRTH:  1976/03/09   DATE OF ADMISSION:  10/12/2005  DATE OF DISCHARGE:  10/14/2005                                 DISCHARGE SUMMARY   FINAL DIAGNOSIS:  Intrauterine pregnancy at term, frank breech presentation  in active labor.   PROCEDURE:  Primary low transverse cesarean section. Surgeon Dr. Malva Limes.  Complications none.   This 35 year old, G2, P0-0-1-0 presents at term for a primary low transverse  cesarean section secondary to breech presentation.  The patient's antepartum  course up to this point had been complicated by a positive group B strep  culture obtained in the office at 35 weeks.  The patient was also Rh  negative and did receive her RhoGAM at 28 weeks.  Otherwise the patient's  antepartum course had been uncomplicated. She is taken to the operating room  on Oct 12, 2005 by Dr. Malva Limes where a primary low transverse cesarean  section was performed with the delivery of a delivery of bed a fine the  delivery of a 7 pound 4 ounce female infant with Apgar's of 9 and 10. The  delivery went without complications.  The patient's postoperative course was  benign without any significant fevers.  She was felt ready for discharge on  postoperative day #2. She was sent home on a regular diet, told to decrease  here activities, told to continue her vitamins and was given Percocet 1-2  every 4 hours as needed for pain and was to follow up in our office in 4  weeks.   LABS ON DISCHARGE:  The patient had a hemoglobin of 12.2, white blood cell  count 11.8,  platelets of 126,000.  The patient also did receive RhoGAM  prior to discharge.      Leilani Able, P.A.-C.    ______________________________  Malva Limes, M.D.    MB/MEDQ  D:   11/05/2005  T:  11/06/2005  Job:  [id number]

## 2010-11-30 ENCOUNTER — Other Ambulatory Visit: Payer: Self-pay | Admitting: Obstetrics and Gynecology

## 2011-03-07 ENCOUNTER — Encounter: Payer: Self-pay | Admitting: Family Medicine

## 2011-03-07 ENCOUNTER — Ambulatory Visit (INDEPENDENT_AMBULATORY_CARE_PROVIDER_SITE_OTHER): Payer: BLUE CROSS/BLUE SHIELD | Admitting: Family Medicine

## 2011-03-07 DIAGNOSIS — E785 Hyperlipidemia, unspecified: Secondary | ICD-10-CM

## 2011-03-07 DIAGNOSIS — Z23 Encounter for immunization: Secondary | ICD-10-CM

## 2011-03-07 DIAGNOSIS — F411 Generalized anxiety disorder: Secondary | ICD-10-CM

## 2011-03-07 DIAGNOSIS — F419 Anxiety disorder, unspecified: Secondary | ICD-10-CM

## 2011-03-07 DIAGNOSIS — G4700 Insomnia, unspecified: Secondary | ICD-10-CM

## 2011-03-07 DIAGNOSIS — R000 Tachycardia, unspecified: Secondary | ICD-10-CM

## 2011-03-07 MED ORDER — SERTRALINE HCL 50 MG PO TABS: 50.0000 mg | ORAL_TABLET | Freq: Every day | ORAL | Status: DC

## 2011-03-07 MED ORDER — ALPRAZOLAM 0.25 MG PO TABS: 0.2500 mg | ORAL_TABLET | ORAL | Status: DC | PRN

## 2011-03-07 MED ORDER — METOPROLOL SUCCINATE ER 25 MG PO TB24: 25.0000 mg | ORAL_TABLET | Freq: Every day | ORAL | Status: DC

## 2011-03-07 MED ORDER — TETANUS-DIPHTH-ACELL PERTUSSIS 5-2.5-18.5 LF-MCG/0.5 IM SUSP: 0.5000 mL | Freq: Once | INTRAMUSCULAR | Status: DC

## 2011-03-07 NOTE — Assessment & Plan Note (Signed)
Avoid trans fats, recheck lipids with next years annual exam, maintain a heart healthy diet

## 2011-03-07 NOTE — Patient Instructions (Signed)

## 2011-03-07 NOTE — Assessment & Plan Note (Signed)
Good response to Sertraline, given refills on the 50 mg dose today, call with any concerns

## 2011-03-07 NOTE — Assessment & Plan Note (Signed)
Using Alprazolam prn at times for sleep may use 1/2 to 1 tab po qhs several times a week as needed for sleep

## 2011-03-07 NOTE — Progress Notes (Signed)
MESA JANUS [id number] 01-Jan-1976 03/07/2011      Progress Note-Follow Up  Subjective  Chief Complaint  Chief Complaint  Patient presents with  . Follow-up    6 month follow up    HPI  Patient is a 35 year old Caucasian female who is in today for followup of multiple medical problems. She reports her tachycardia is improved. No episodes of palpitations, chest pain, shortness of breath at this time. Her anxiety is greatly improved on the Sertraline. No concerns, she would like to stay on current dose. No recent illness, fevers, chills, congestion, GI or GU complaints. She does use out basal and infrequently for insomnia and finds that helpful otherwise she's not generally needed alprazolam for any anxiety or panic attacks  Past Medical History  Diagnosis Date  . Anxiety   . Hypertension   . TACHYCARDIA 04/21/2010  . INSOMNIA 04/21/2010  . HYPERLIPIDEMIA 04/21/2010  . ANXIETY DISORDER 04/21/2010    Past Surgical History  Procedure Date  . Cesarean section 2007  . Leep 2005  . Dilation and curettage of uterus     incomplete miscarriage  . 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 Hypertension Father   . Arthritis Father   . Migraines Mother   . Interstitial cystitis Mother   . Ulcers Maternal Grandmother     Peptic  . Anemia Maternal Grandmother   . Heart disease Maternal Grandfather   . Heart disease Paternal Grandfather   . Alzheimer's disease Paternal Grandmother   . Other Paternal Grandmother     Possible PE    History   Social History  . Marital Status: Single    Spouse Name: N/A    Number of Children: N/A  . Years of Education: N/A   Occupational History  . Not on file.   Social History Main Topics  . Smoking status: Former Smoker    Quit date: 02/05/2009  . Smokeless tobacco: Never Used  . Alcohol Use: 0.0 oz/week    0 drink(s) per week     occasionaly/ very rare  . Drug Use:  No  . Sexually Active: Yes -- Female partner(s)   Other Topics Concern  . Not on file   Social History Narrative  . No narrative on file    Current Outpatient Prescriptions on File Prior to Visit  Medication Sig Dispense Refill  . norethindrone-ethinyl estradiol (TRIPHASIL) 0.5/0.75/1-35 MG-MCG per tablet Take 1 tablet by mouth daily.        . Multiple Vitamin (MULTIVITAMIN) tablet Take 1 tablet by mouth daily.         No current facility-administered medications on file prior to visit.    Allergies  Allergen Reactions  . Sulfonamide Derivatives     Review of Systems  Review of Systems  Constitutional: Negative for fever and malaise/fatigue.  HENT: Negative for congestion.   Eyes: Negative for discharge.  Respiratory: Negative for shortness of breath.   Cardiovascular: Negative for chest pain, palpitations and leg swelling.  Gastrointestinal: Negative for nausea, abdominal pain and diarrhea.  Genitourinary: Negative for dysuria.  Musculoskeletal: Negative for falls.  Skin: Negative for rash.  Neurological: Negative for loss of consciousness and headaches.  Endo/Heme/Allergies: Negative for polydipsia.  Psychiatric/Behavioral: Negative for depression and suicidal ideas. The patient is not nervous/anxious and does not have insomnia.     Objective  BP 110/76  Pulse 96  Temp(Src)  98.1 F (36.7 C) (Oral)  Ht 5' 2.75" (1.594 m)  Wt 123 lb 12.8 oz (56.155 kg)  BMI 22.11 kg/m2  SpO2 100%  LMP 03/05/2011  Physical Exam  Physical Exam  Constitutional: She is oriented to person, place, and time and well-developed, well-nourished, and in no distress. No distress.  HENT:  Head: Normocephalic and atraumatic.  Eyes: Conjunctivae are normal.  Neck: Neck supple. No thyromegaly present.  Cardiovascular: Normal rate, regular rhythm and normal heart sounds.   No murmur heard. Pulmonary/Chest: Effort normal and breath sounds normal. She has no wheezes.  Abdominal: She exhibits  no distension and no mass.  Musculoskeletal: She exhibits no edema.  Lymphadenopathy:    She has no cervical adenopathy.  Neurological: She is alert and oriented to person, place, and time.  Skin: Skin is warm and dry. No rash noted. She is not diaphoretic.  Psychiatric: Memory, affect and judgment normal.      Assessment & Plan  TACHYCARDIA Improved on Metoprolol, patient reports she sees numbers usually in the 70s when she is elsewhere. No sense of palpitations.  INSOMNIA Using Alprazolam prn at times for sleep may use 1/2 to 1 tab po qhs several times a week as needed for sleep  ANXIETY DISORDER Good response to Sertraline, given refills on the 50 mg dose today, call with any concerns  HYPERLIPIDEMIA Avoid trans fats, recheck lipids with next years annual exam, maintain a heart healthy diet

## 2011-03-07 NOTE — Assessment & Plan Note (Signed)
Improved on Metoprolol, patient reports she sees numbers usually in the 70s when she is elsewhere. No sense of palpitations.

## 2011-03-08 ENCOUNTER — Other Ambulatory Visit: Payer: Self-pay | Admitting: Family Medicine

## 2011-04-23 ENCOUNTER — Other Ambulatory Visit: Payer: Self-pay | Admitting: Family Medicine

## 2011-05-02 ENCOUNTER — Ambulatory Visit: Payer: BLUE CROSS/BLUE SHIELD | Admitting: Family Medicine

## 2011-09-04 ENCOUNTER — Encounter: Payer: Self-pay | Admitting: Family Medicine

## 2011-09-04 ENCOUNTER — Ambulatory Visit (INDEPENDENT_AMBULATORY_CARE_PROVIDER_SITE_OTHER): Payer: BLUE CROSS/BLUE SHIELD | Admitting: Family Medicine

## 2011-09-04 VITALS — BP 125/82 | HR 90 | Temp 98.4°F | Ht 62.75 in | Wt 122.8 lb

## 2011-09-04 DIAGNOSIS — G4700 Insomnia, unspecified: Secondary | ICD-10-CM

## 2011-09-04 DIAGNOSIS — E785 Hyperlipidemia, unspecified: Secondary | ICD-10-CM

## 2011-09-04 DIAGNOSIS — R000 Tachycardia, unspecified: Secondary | ICD-10-CM

## 2011-09-04 MED ORDER — METOPROLOL SUCCINATE ER 25 MG PO TB24: 25.0000 mg | ORAL_TABLET | Freq: Every day | ORAL | Status: DC

## 2011-09-04 NOTE — Progress Notes (Signed)
Patient ID: DARLENA KOVAL, female   DOB: February 09, 1976, 36 y.o.   MRN: [id number] CASSIA FEIN [id number] 12/10/1975 09/04/2011      Progress Note-Follow Up  Subjective  Chief Complaint  Chief Complaint  Patient presents with  . Follow-up    6 month- hasn't taken the Metoprolol    HPI  Patient is a 36 yo Caucasian female in today for follow up. Over all she is doing well but she has been out of her Metoprolol for a couple weeks. No cp/palp/SOB/GI or GU c/o. Patient is c/o some night sweats for a couple of months. Is afraid it is the Zoloft that is causing the sweating. The Zoloft is working for the anxiety and she acknowledges feeling much calmer and and able to concentrate on the Zoloft. She denies any chest pain, shortness of breath, GI or GU concerns. Has noted a few palpitations since stopping the Toprol. No other concerns. Reports her health has been good she has no fevers, chills congestion or other concerns. Still has alprazolam but has not needed any time recently he  Past Medical History  Diagnosis Date  . Anxiety   . Hypertension   . TACHYCARDIA 04/21/2010  . INSOMNIA 04/21/2010  . HYPERLIPIDEMIA 04/21/2010  . ANXIETY DISORDER 04/21/2010    Past Surgical History  Procedure Date  . Cesarean section 2007  . Leep 2005  . Dilation and curettage of uterus     incomplete miscarriage  . 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 Hypertension Father   . Arthritis Father   . Migraines Mother   . Interstitial cystitis Mother   . Ulcers Maternal Grandmother     Peptic  . Anemia Maternal Grandmother   . Heart disease Maternal Grandfather   . Heart disease Paternal Grandfather   . Alzheimer's disease Paternal Grandmother   . Other Paternal Grandmother     Possible PE    History   Social History  . Marital Status: Single    Spouse Name: N/A    Number of Children: N/A  . Years of Education: N/A    Occupational History  . Not on file.   Social History Main Topics  . Smoking status: Former Smoker    Quit date: 02/05/2009  . Smokeless tobacco: Never Used  . Alcohol Use: 0.0 oz/week    0 drink(s) per week     occasionaly/ very rare  . Drug Use: No  . Sexually Active: Yes -- Female partner(s)   Other Topics Concern  . Not on file   Social History Narrative  . No narrative on file    Current Outpatient Prescriptions on File Prior to Visit  Medication Sig Dispense Refill  . ALPRAZolam (XANAX) 0.25 MG tablet Take 1 tablet (0.25 mg total) by mouth as needed for sleep or anxiety. 1/2 to 1 tab  For anxiety  40 tablet  3  . norethindrone-ethinyl estradiol (TRIPHASIL) 0.5/0.75/1-35 MG-MCG per tablet Take 1 tablet by mouth daily.        . sertraline (ZOLOFT) 50 MG tablet Take 1 tablet (50 mg total) by mouth daily.  30 tablet  6  . DISCONTD: metoprolol succinate (TOPROL-XL) 25 MG 24 hr tablet Take 1 tablet (25 mg total) by mouth daily.  30 tablet  6  . DISCONTD: metoprolol succinate (TOPROL-XL) 25 MG 24 hr tablet TAKE 1 TABLET BY MOUTH DAILY.  30 tablet  5  . DISCONTD: sertraline (ZOLOFT) 50 MG tablet TAKE 1 TABLET BY MOUTH DAILY.  30 tablet  5    Allergies  Allergen Reactions  . Sulfonamide Derivatives     Review of Systems  Review of Systems  Constitutional: Negative for fever and malaise/fatigue.  HENT: Negative for congestion.   Eyes: Negative for discharge.  Respiratory: Negative for shortness of breath.   Cardiovascular: Positive for palpitations. Negative for chest pain and leg swelling.  Gastrointestinal: Negative for nausea, abdominal pain and diarrhea.  Genitourinary: Negative for dysuria.  Musculoskeletal: Negative for falls.  Skin: Negative for rash.  Neurological: Negative for loss of consciousness and headaches.  Endo/Heme/Allergies: Negative for polydipsia.  Psychiatric/Behavioral: Negative for depression and suicidal ideas. The patient is not nervous/anxious  and does not have insomnia.     Objective  BP 125/82  Pulse 90  Temp(Src) 98.4 F (36.9 C) (Temporal)  Ht 5' 2.75" (1.594 m)  Wt 122 lb 12.8 oz (55.702 kg)  BMI 21.93 kg/m2  SpO2 100%  LMP 08/21/2011  Physical Exam  Physical Exam  Constitutional: She is oriented to person, place, and time and well-developed, well-nourished, and in no distress. No distress.  HENT:  Head: Normocephalic and atraumatic.  Eyes: Conjunctivae are normal.  Neck: Neck supple. No thyromegaly present.  Cardiovascular: Normal rate, regular rhythm and normal heart sounds.   No murmur heard. Pulmonary/Chest: Effort normal and breath sounds normal. She has no wheezes.  Abdominal: She exhibits no distension and no mass.  Musculoskeletal: She exhibits no edema.  Lymphadenopathy:    She has no cervical adenopathy.  Neurological: She is alert and oriented to person, place, and time.  Skin: Skin is warm and dry. No rash noted. She is not diaphoretic.  Psychiatric: Memory, affect and judgment normal.        Assessment & Plan  TACHYCARDIA Patient is out of her Metoprolol for a couple weeks she has noted an increase in palpitations. She agrees to restart the Metoprolol today and have her fasting labs in the am.  INSOMNIA Doing well, using alprazolam prn  HYPERLIPIDEMIA Check lipids in the am

## 2011-09-04 NOTE — Patient Instructions (Signed)
Tachycardia, Nonspecific  In adults, the heart normally beats between 60 and 100 times a minute. A heart rate over 100 is called tachycardia. When your heart beats too fast, it may not be able to pump enough blood to the rest of the body.  CAUSES    Exercise or exertion.   Fever.   Pain or injury.   Infection.   Loss of fluid (dehydration).   Overactive thyroid.   Lack of red blood cells (anemia).   Anxiety.   Alcohol.   Heart arrhythmia.   Caffeine.   Tobacco products.   Diet pills.   Street drugs.   Heart disease.  SYMPTOMS   Palpitations (rapid or irregular heartbeat).   Dizziness.   Tiredness (fatigue).   Shortness of breath.  DIAGNOSIS   After an exam and taking a history, your caregiver may order:   Blood tests.   Electrocardiogram (EKG).   Heart monitor.  TREATMENT   Treatment will depend on the cause and potential for harm. It may include:   Intravenous (IV) replacement of fluids or blood.   Antidote or reversal medicines.   Changes in your present medicines.   Lifestyle changes.  HOME CARE INSTRUCTIONS    Get rest.   Drink enough water and fluids to keep your urine clear or pale yellow.   Avoid:   Caffeine.   Nicotine.   Alcohol.   Stress.   Chocolate.   Stimulants.   Only take medicine as directed by your caregiver.  SEEK IMMEDIATE MEDICAL CARE IF:    You have pain in your chest, upper arms, jaw, or neck.   You become weak, dizzy, or feel faint.   You have palpitations that will not go away.   You throw up (vomit), have diarrhea, or pass blood.   You look pale and your skin is cool and wet.  MAKE SURE YOU:    Understand these instructions.   Will watch your condition.   Will get help right away if you are not doing well or get worse.  Document Released: 06/14/2004 Document Revised: 04/26/2011 Document Reviewed: 04/17/2011  [employer] Patient Information 2012 [employer]

## 2011-09-04 NOTE — Assessment & Plan Note (Signed)
Patient is out of her Metoprolol for a couple weeks she has noted an increase in palpitations. She agrees to restart the Metoprolol today and have her fasting labs in the am.

## 2011-09-04 NOTE — Assessment & Plan Note (Signed)
Check lipids in the am

## 2011-09-04 NOTE — Assessment & Plan Note (Signed)
Doing well, using alprazolam prn

## 2011-09-05 ENCOUNTER — Other Ambulatory Visit: Payer: Self-pay | Admitting: Family Medicine

## 2011-09-05 ENCOUNTER — Ambulatory Visit: Payer: BLUE CROSS/BLUE SHIELD | Admitting: Family Medicine

## 2011-09-05 NOTE — Progress Notes (Signed)
Addended by: Baldemar Lenis R on: 09/05/2011 11:46 AM   Modules accepted: Orders

## 2011-09-06 LAB — HEPATIC FUNCTION PANEL
Albumin: 4.1 g/dL (ref 3.5–5.2)
Indirect Bilirubin: 0.3 mg/dL (ref 0.0–0.9)
Total Protein: 6.6 g/dL (ref 6.0–8.3)

## 2011-09-06 LAB — LIPID PANEL
LDL Cholesterol: 73 mg/dL (ref 0–99)
Total CHOL/HDL Ratio: 2.4 Ratio
Triglycerides: 106 mg/dL (ref <150)
VLDL: 21 mg/dL (ref 0–40)

## 2011-09-06 LAB — CBC
HCT: 44.2 % (ref 36.0–46.0)
Hemoglobin: 14.7 g/dL (ref 12.0–15.0)
RDW: 13 % (ref 11.5–15.5)
WBC: 7.5 10*3/uL (ref 4.0–10.5)

## 2011-09-06 LAB — PHOSPHORUS: Phosphorus: 3.3 mg/dL (ref 2.3–4.6)

## 2011-09-06 LAB — BASIC METABOLIC PANEL
BUN: 8 mg/dL (ref 6–23)
Chloride: 106 mEq/L (ref 96–112)
Potassium: 4.3 mEq/L (ref 3.5–5.3)
Sodium: 142 mEq/L (ref 135–145)

## 2011-09-06 LAB — TSH: TSH: 3.768 u[IU]/mL (ref 0.350–4.500)

## 2011-10-30 ENCOUNTER — Other Ambulatory Visit: Payer: Self-pay

## 2011-10-30 DIAGNOSIS — F419 Anxiety disorder, unspecified: Secondary | ICD-10-CM

## 2011-10-30 MED ORDER — SERTRALINE HCL 50 MG PO TABS: 50.0000 mg | ORAL_TABLET | Freq: Every day | ORAL | Status: DC

## 2012-03-12 ENCOUNTER — Encounter: Payer: Self-pay | Admitting: Family Medicine

## 2012-03-12 ENCOUNTER — Ambulatory Visit (INDEPENDENT_AMBULATORY_CARE_PROVIDER_SITE_OTHER): Payer: BLUE CROSS/BLUE SHIELD | Admitting: Family Medicine

## 2012-03-12 VITALS — BP 138/80 | HR 87 | Temp 97.6°F | Ht 62.75 in | Wt 126.8 lb

## 2012-03-12 DIAGNOSIS — Z23 Encounter for immunization: Secondary | ICD-10-CM

## 2012-03-12 DIAGNOSIS — G4700 Insomnia, unspecified: Secondary | ICD-10-CM

## 2012-03-12 DIAGNOSIS — R000 Tachycardia, unspecified: Secondary | ICD-10-CM

## 2012-03-12 DIAGNOSIS — IMO0001 Reserved for inherently not codable concepts without codable children: Secondary | ICD-10-CM

## 2012-03-12 DIAGNOSIS — F419 Anxiety disorder, unspecified: Secondary | ICD-10-CM

## 2012-03-12 DIAGNOSIS — R030 Elevated blood-pressure reading, without diagnosis of hypertension: Secondary | ICD-10-CM

## 2012-03-12 DIAGNOSIS — F411 Generalized anxiety disorder: Secondary | ICD-10-CM

## 2012-03-12 MED ORDER — ALPRAZOLAM 0.25 MG PO TABS: 0.2500 mg | ORAL_TABLET | ORAL | Status: DC | PRN

## 2012-03-12 MED ORDER — METOPROLOL SUCCINATE ER 25 MG PO TB24: 25.0000 mg | ORAL_TABLET | Freq: Every day | ORAL | Status: DC

## 2012-03-12 MED ORDER — SERTRALINE HCL 50 MG PO TABS: 50.0000 mg | ORAL_TABLET | Freq: Every day | ORAL | Status: DC

## 2012-03-14 ENCOUNTER — Ambulatory Visit: Payer: BLUE CROSS/BLUE SHIELD | Admitting: Family Medicine

## 2012-03-16 ENCOUNTER — Encounter: Payer: Self-pay | Admitting: Family Medicine

## 2012-03-16 DIAGNOSIS — IMO0001 Reserved for inherently not codable concepts without codable children: Secondary | ICD-10-CM | POA: Insufficient documentation

## 2012-03-16 NOTE — Assessment & Plan Note (Signed)
Sleeping fairly well

## 2012-03-16 NOTE — Assessment & Plan Note (Signed)
Good response to Sertraline, uses Alprazolam infrequently

## 2012-03-16 NOTE — Assessment & Plan Note (Signed)
Well controlled 

## 2012-03-16 NOTE — Progress Notes (Signed)
Patient ID: Vickie Bennett, female   DOB: August 11, 1975, 36 y.o.   MRN: [id number] Vickie Bennett [id number] 1975-08-26 03/16/2012      Progress Note-Follow Up  Subjective  Chief Complaint  Chief Complaint  Patient presents with  . Follow-up    6 month  . Injections    flu    HPI  Patient is a 36 year old Caucasian female who is in today for followup on tachycardia and anxiety. She reports she's doing very well. She reports she's had very few palpitations and near largely asymptomatic. Her anxiety and stress are well controlled with sertraline. She denies any recent illness, fevers, headaches, chest pain, palpitations, shortness of breath, GI or GU complaints. She has had to use her alprazolam very infrequently since last seen.  Past Medical History  Diagnosis Date  . Anxiety   . Hypertension   . TACHYCARDIA 04/21/2010  . INSOMNIA 04/21/2010  . HYPERLIPIDEMIA 04/21/2010  . ANXIETY DISORDER 04/21/2010  . Elevated BP 03/16/2012    Past Surgical History  Procedure Date  . Cesarean section 2007  . Leep 2005  . Dilation and curettage of uterus     incomplete miscarriage  . 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 Hypertension Father   . Arthritis Father   . Migraines Mother   . Interstitial cystitis Mother   . Ulcers Maternal Grandmother     Peptic  . Anemia Maternal Grandmother   . Heart disease Maternal Grandfather   . Heart disease Paternal Grandfather   . Alzheimer's disease Paternal Grandmother   . Other Paternal Grandmother     Possible PE    History   Social History  . Marital Status: Single    Spouse Name: N/A    Number of Children: N/A  . Years of Education: N/A   Occupational History  . Not on file.   Social History Main Topics  . Smoking status: Former Smoker    Quit date: 02/05/2009  . Smokeless tobacco: Never Used  . Alcohol Use: 0.0 oz/week    0 drink(s) per week   occasionaly/ very rare  . Drug Use: No  . Sexually Active: Yes -- Female partner(s)   Other Topics Concern  . Not on file   Social History Narrative  . No narrative on file    Current Outpatient Prescriptions on File Prior to Visit  Medication Sig Dispense Refill  . metoprolol succinate (TOPROL-XL) 25 MG 24 hr tablet Take 1 tablet (25 mg total) by mouth daily.  90 tablet  3  . norethindrone-ethinyl estradiol (TRIPHASIL) 0.5/0.75/1-35 MG-MCG per tablet Take 1 tablet by mouth daily.        . sertraline (ZOLOFT) 50 MG tablet Take 1 tablet (50 mg total) by mouth daily.  90 tablet  3  . TRI-PREVIFEM 0.18/0.215/0.25 MG-35 MCG tablet         Allergies  Allergen Reactions  . Sulfonamide Derivatives     Review of Systems  Review of Systems  Constitutional: Negative for fever and malaise/fatigue.  HENT: Negative for congestion.   Eyes: Negative for discharge.  Respiratory: Negative for shortness of breath.   Cardiovascular: Negative for chest pain, palpitations and leg swelling.  Gastrointestinal: Negative for nausea, abdominal pain and diarrhea.  Genitourinary: Negative for dysuria.  Musculoskeletal: Negative for falls.  Skin: Negative for rash.  Neurological: Negative for loss of consciousness and headaches.  Endo/Heme/Allergies: Negative for polydipsia.  Psychiatric/Behavioral: Negative for depression and suicidal ideas. The patient is not nervous/anxious and does not have insomnia.     Objective  BP 138/80  Pulse 87  Temp 97.6 F (36.4 C) (Temporal)  Ht 5' 2.75" (1.594 m)  Wt 126 lb 12.8 oz (57.516 kg)  BMI 22.64 kg/m2  SpO2 100%  LMP 03/03/2012  Physical Exam  Physical Exam  Constitutional: She is oriented to person, place, and time and well-developed, well-nourished, and in no distress. No distress.  HENT:  Head: Normocephalic and atraumatic.  Eyes: Conjunctivae normal are normal.  Neck: Neck supple. No thyromegaly present.  Cardiovascular: Normal rate, regular  rhythm and normal heart sounds.   No murmur heard. Pulmonary/Chest: Effort normal and breath sounds normal. She has no wheezes.  Abdominal: She exhibits no distension and no mass.  Musculoskeletal: She exhibits no edema.  Lymphadenopathy:    She has no cervical adenopathy.  Neurological: She is alert and oriented to person, place, and time.  Skin: Skin is warm and dry. No rash noted. She is not diaphoretic.  Psychiatric: Memory, affect and judgment normal.    Lab Results  Component Value Date   TSH 3.768 09/05/2011   Lab Results  Component Value Date   WBC 7.5 09/05/2011   HGB 14.7 09/05/2011   HCT 44.2 09/05/2011   MCV 95.7 09/05/2011   PLT 159 09/05/2011   Lab Results  Component Value Date   CREATININE 0.65 09/05/2011   BUN 8 09/05/2011   NA 142 09/05/2011   K 4.3 09/05/2011   CL 106 09/05/2011   CO2 26 09/05/2011   Lab Results  Component Value Date   ALT 11 09/05/2011   AST 15 09/05/2011   ALKPHOS 58 09/05/2011   BILITOT 0.4 09/05/2011   Lab Results  Component Value Date   CHOL 161 09/05/2011   Lab Results  Component Value Date   HDL 67 09/05/2011   Lab Results  Component Value Date   LDLCALC 73 09/05/2011   Lab Results  Component Value Date   TRIG 106 09/05/2011   Lab Results  Component Value Date   CHOLHDL 2.4 09/05/2011     Assessment & Plan  Elevated BP Improved on recheck, minimize sodium and caffeine  TACHYCARDIA Well controlled   INSOMNIA Sleeping fairly well   ANXIETY DISORDER Good response to Sertraline, uses Alprazolam infrequently

## 2012-03-16 NOTE — Assessment & Plan Note (Signed)
Improved on recheck, minimize sodium and caffeine 

## 2012-04-07 ENCOUNTER — Other Ambulatory Visit: Payer: Self-pay

## 2012-04-07 DIAGNOSIS — R000 Tachycardia, unspecified: Secondary | ICD-10-CM

## 2012-04-07 MED ORDER — METOPROLOL SUCCINATE ER 25 MG PO TB24: 25.0000 mg | ORAL_TABLET | Freq: Every day | ORAL | Status: DC

## 2012-05-28 ENCOUNTER — Other Ambulatory Visit: Payer: Self-pay | Admitting: Family Medicine

## 2012-07-05 ENCOUNTER — Other Ambulatory Visit: Payer: Self-pay | Admitting: Family Medicine

## 2012-10-17 ENCOUNTER — Other Ambulatory Visit: Payer: Self-pay | Admitting: Family Medicine

## 2012-11-05 ENCOUNTER — Ambulatory Visit (INDEPENDENT_AMBULATORY_CARE_PROVIDER_SITE_OTHER): Payer: BLUE CROSS/BLUE SHIELD | Admitting: Family Medicine

## 2012-11-05 ENCOUNTER — Encounter: Payer: Self-pay | Admitting: Family Medicine

## 2012-11-05 ENCOUNTER — Encounter: Payer: BLUE CROSS/BLUE SHIELD | Admitting: Family Medicine

## 2012-11-05 VITALS — BP 102/68 | HR 82 | Temp 98.1°F | Ht 62.75 in | Wt 120.0 lb

## 2012-11-05 DIAGNOSIS — Z124 Encounter for screening for malignant neoplasm of cervix: Secondary | ICD-10-CM | POA: Insufficient documentation

## 2012-11-05 DIAGNOSIS — R000 Tachycardia, unspecified: Secondary | ICD-10-CM

## 2012-11-05 DIAGNOSIS — F411 Generalized anxiety disorder: Secondary | ICD-10-CM

## 2012-11-05 DIAGNOSIS — IMO0001 Reserved for inherently not codable concepts without codable children: Secondary | ICD-10-CM

## 2012-11-05 DIAGNOSIS — F419 Anxiety disorder, unspecified: Secondary | ICD-10-CM

## 2012-11-05 DIAGNOSIS — Z0000 Encounter for general adult medical examination without abnormal findings: Secondary | ICD-10-CM

## 2012-11-05 DIAGNOSIS — R030 Elevated blood-pressure reading, without diagnosis of hypertension: Secondary | ICD-10-CM

## 2012-11-05 DIAGNOSIS — G4700 Insomnia, unspecified: Secondary | ICD-10-CM

## 2012-11-05 DIAGNOSIS — Z309 Encounter for contraceptive management, unspecified: Secondary | ICD-10-CM

## 2012-11-05 MED ORDER — SERTRALINE HCL 50 MG PO TABS: 50.0000 mg | ORAL_TABLET | Freq: Every day | ORAL | Status: DC

## 2012-11-05 MED ORDER — ALPRAZOLAM 0.25 MG PO TABS: 0.2500 mg | ORAL_TABLET | ORAL | Status: DC | PRN

## 2012-11-05 MED ORDER — NORETHIN-ETH ESTRAD TRIPHASIC 0.5/0.75/1-35 MG-MCG PO TABS: 1.0000 | ORAL_TABLET | Freq: Every day | ORAL | Status: DC

## 2012-11-05 MED ORDER — METOPROLOL SUCCINATE ER 25 MG PO TB24: 25.0000 mg | ORAL_TABLET | Freq: Every day | ORAL | Status: DC

## 2012-11-05 NOTE — Assessment & Plan Note (Signed)
Continues to do well on Sertraline, given refill on Sertraline and Alprazolam, using Alprazolam very infrequently.

## 2012-11-05 NOTE — Patient Instructions (Addendum)
[employer] probiotic daily    Preventive Care for Adults, Female A healthy lifestyle and preventive care can promote health and wellness. Preventive health guidelines for women include the following key practices.  A routine yearly physical is a good way to check with your caregiver about your health and preventive screening. It is a chance to share any concerns and updates on your health, and to receive a thorough exam.  Visit your dentist for a routine exam and preventive care every 6 months. Brush your teeth twice a day and floss once a day. Good oral hygiene prevents tooth decay and gum disease.  The frequency of eye exams is based on your age, health, family medical history, use of contact lenses, and other factors. Follow your caregiver's recommendations for frequency of eye exams.  Eat a healthy diet. Foods like vegetables, fruits, whole grains, low-fat dairy products, and lean protein foods contain the nutrients you need without too many calories. Decrease your intake of foods high in solid fats, added sugars, and salt. Eat the right amount of calories for you.Get information about a proper diet from your caregiver, if necessary.  Regular physical exercise is one of the most important things you can do for your health. Most adults should get at least 150 minutes of moderate-intensity exercise (any activity that increases your heart rate and causes you to sweat) each week. In addition, most adults need muscle-strengthening exercises on 2 or more days a week.  Maintain a healthy weight. The body mass index (BMI) is a screening tool to identify possible weight problems. It provides an estimate of body fat based on height and weight. Your caregiver can help determine your BMI, and can help you achieve or maintain a healthy weight.For adults 20 years and older:  A BMI below 18.5 is considered underweight.  A BMI of 18.5 to 24.9 is normal.  A BMI of 25 to 29.9 is considered  overweight.  A BMI of 30 and above is considered obese.  Maintain normal blood lipids and cholesterol levels by exercising and minimizing your intake of saturated fat. Eat a balanced diet with plenty of fruit and vegetables. Blood tests for lipids and cholesterol should begin at age 108 and be repeated every 5 years. If your lipid or cholesterol levels are high, you are over 50, or you are at high risk for heart disease, you may need your cholesterol levels checked more frequently.Ongoing high lipid and cholesterol levels should be treated with medicines if diet and exercise are not effective.  If you smoke, find out from your caregiver how to quit. If you do not use tobacco, do not start.  If you are pregnant, do not drink alcohol. If you are breastfeeding, be very cautious about drinking alcohol. If you are not pregnant and choose to drink alcohol, do not exceed 1 drink per day. One drink is considered to be 12 ounces (355 mL) of beer, 5 ounces (148 mL) of wine, or 1.5 ounces (44 mL) of liquor.  Avoid use of street drugs. Do not share needles with anyone. Ask for help if you need support or instructions about stopping the use of drugs.  High blood pressure causes heart disease and increases the risk of stroke. Your blood pressure should be checked at least every 1 to 2 years. Ongoing high blood pressure should be treated with medicines if weight loss and exercise are not effective.  If you are 78 to 35 years old, ask your caregiver if you should  take aspirin to prevent strokes.  Diabetes screening involves taking a blood sample to check your fasting blood sugar level. This should be done once every 3 years, after age 40, if you are within normal weight and without risk factors for diabetes. Testing should be considered at a younger age or be carried out more frequently if you are overweight and have at least 1 risk factor for diabetes.  Breast cancer screening is essential preventive care for  women. You should practice "breast self-awareness." This means understanding the normal appearance and feel of your breasts and may include breast self-examination. Any changes detected, no matter how small, should be reported to a caregiver. Women in their 6s and 30s should have a clinical breast exam (CBE) by a caregiver as part of a regular health exam every 1 to 3 years. After age 23, women should have a CBE every year. Starting at age 15, women should consider having a mammography (breast X-ray test) every year. Women who have a family history of breast cancer should talk to their caregiver about genetic screening. Women at a high risk of breast cancer should talk to their caregivers about having magnetic resonance imaging (MRI) and a mammography every year.  The Pap test is a screening test for cervical cancer. A Pap test can show cell changes on the cervix that might become cervical cancer if left untreated. A Pap test is a procedure in which cells are obtained and examined from the lower end of the uterus (cervix).  Women should have a Pap test starting at age 61.  Between ages 11 and 58, Pap tests should be repeated every 2 years.  Beginning at age 30, you should have a Pap test every 3 years as long as the past 3 Pap tests have been normal.  Some women have medical problems that increase the chance of getting cervical cancer. Talk to your caregiver about these problems. It is especially important to talk to your caregiver if a new problem develops soon after your last Pap test. In these cases, your caregiver may recommend more frequent screening and Pap tests.  The above recommendations are the same for women who have or have not gotten the vaccine for human papillomavirus (HPV).  If you had a hysterectomy for a problem that was not cancer or a condition that could lead to cancer, then you no longer need Pap tests. Even if you no longer need a Pap test, a regular exam is a good idea to make  sure no other problems are starting.  If you are between ages 62 and 79, and you have had normal Pap tests going back 10 years, you no longer need Pap tests. Even if you no longer need a Pap test, a regular exam is a good idea to make sure no other problems are starting.  If you have had past treatment for cervical cancer or a condition that could lead to cancer, you need Pap tests and screening for cancer for at least 20 years after your treatment.  If Pap tests have been discontinued, risk factors (such as a new sexual partner) need to be reassessed to determine if screening should be resumed.  The HPV test is an additional test that may be used for cervical cancer screening. The HPV test looks for the virus that can cause the cell changes on the cervix. The cells collected during the Pap test can be tested for HPV. The HPV test could be used to screen women  aged 30 years and older, and should be used in women of any age who have unclear Pap test results. After the age of 54, women should have HPV testing at the same frequency as a Pap test.  Colorectal cancer can be detected and often prevented. Most routine colorectal cancer screening begins at the age of 20 and continues through age 99. However, your caregiver may recommend screening at an earlier age if you have risk factors for colon cancer. On a yearly basis, your caregiver may provide home test kits to check for hidden blood in the stool. Use of a small camera at the end of a tube, to directly examine the colon (sigmoidoscopy or colonoscopy), can detect the earliest forms of colorectal cancer. Talk to your caregiver about this at age 53, when routine screening begins. Direct examination of the colon should be repeated every 5 to 10 years through age 65, unless early forms of pre-cancerous polyps or small growths are found.  Hepatitis C blood testing is recommended for all people born from [birth year] through 1965 and any individual with known risks  for hepatitis C.  Practice safe sex. Use condoms and avoid high-risk sexual practices to reduce the spread of sexually transmitted infections (STIs). STIs include gonorrhea, chlamydia, syphilis, trichomonas, herpes, HPV, and human immunodeficiency virus (HIV). Herpes, HIV, and HPV are viral illnesses that have no cure. They can result in disability, cancer, and death. Sexually active women aged 3 and younger should be checked for chlamydia. Older women with new or multiple partners should also be tested for chlamydia. Testing for other STIs is recommended if you are sexually active and at increased risk.  Osteoporosis is a disease in which the bones lose minerals and strength with aging. This can result in serious bone fractures. The risk of osteoporosis can be identified using a bone density scan. Women ages 60 and over and women at risk for fractures or osteoporosis should discuss screening with their caregivers. Ask your caregiver whether you should take a calcium supplement or vitamin D to reduce the rate of osteoporosis.  Menopause can be associated with physical symptoms and risks. Hormone replacement therapy is available to decrease symptoms and risks. You should talk to your caregiver about whether hormone replacement therapy is right for you.  Use sunscreen with sun protection factor (SPF) of 30 or more. Apply sunscreen liberally and repeatedly throughout the day. You should seek shade when your shadow is shorter than you. Protect yourself by wearing long sleeves, pants, a wide-brimmed hat, and sunglasses year round, whenever you are outdoors.  Once a month, do a whole body skin exam, using a mirror to look at the skin on your back. Notify your caregiver of new moles, moles that have irregular borders, moles that are larger than a pencil eraser, or moles that have changed in shape or color.  Stay current with required immunizations.  Influenza. You need a dose every fall (or winter). The  composition of the flu vaccine changes each year, so being vaccinated once is not enough.  Pneumococcal polysaccharide. You need 1 to 2 doses if you smoke cigarettes or if you have certain chronic medical conditions. You need 1 dose at age 69 (or older) if you have never been vaccinated.  Tetanus, diphtheria, pertussis (Tdap, Td). Get 1 dose of Tdap vaccine if you are younger than age 15, are over 34 and have contact with an infant, are a Research scientist (physical sciences), are pregnant, or simply want to be protected from whooping  cough. After that, you need a Td booster dose every 10 years. Consult your caregiver if you have not had at least 3 tetanus and diphtheria-containing shots sometime in your life or have a deep or dirty wound.  HPV. You need this vaccine if you are a woman age 40 or younger. The vaccine is given in 3 doses over 6 months.  Measles, mumps, rubella (MMR). You need at least 1 dose of MMR if you were born in 1957 or later. You may also need a second dose.  Meningococcal. If you are age 62 to 18 and a first-year college student living in a residence hall, or have one of several medical conditions, you need to get vaccinated against meningococcal disease. You may also need additional booster doses.  Zoster (shingles). If you are age 62 or older, you should get this vaccine.  Varicella (chickenpox). If you have never had chickenpox or you were vaccinated but received only 1 dose, talk to your caregiver to find out if you need this vaccine.  Hepatitis A. You need this vaccine if you have a specific risk factor for hepatitis A virus infection or you simply wish to be protected from this disease. The vaccine is usually given as 2 doses, 6 to 18 months apart.  Hepatitis B. You need this vaccine if you have a specific risk factor for hepatitis B virus infection or you simply wish to be protected from this disease. The vaccine is given in 3 doses, usually over 6 months. Preventive Services /  Frequency Ages 62 to 74  Blood pressure check.** / Every 1 to 2 years.  Lipid and cholesterol check.** / Every 5 years beginning at age 31.  Clinical breast exam.** / Every 3 years for women in their 45s and 30s.  Pap test.** / Every 2 years from ages 35 through 77. Every 3 years starting at age 92 through age 98 or 51 with a history of 3 consecutive normal Pap tests.  HPV screening.** / Every 3 years from ages 23 through ages 52 to 5 with a history of 3 consecutive normal Pap tests.  Hepatitis C blood test.** / For any individual with known risks for hepatitis C.  Skin self-exam. / Monthly.  Influenza immunization.** / Every year.  Pneumococcal polysaccharide immunization.** / 1 to 2 doses if you smoke cigarettes or if you have certain chronic medical conditions.  Tetanus, diphtheria, pertussis (Tdap, Td) immunization. / A one-time dose of Tdap vaccine. After that, you need a Td booster dose every 10 years.  HPV immunization. / 3 doses over 6 months, if you are 65 and younger.  Measles, mumps, rubella (MMR) immunization. / You need at least 1 dose of MMR if you were born in 1957 or later. You may also need a second dose.  Meningococcal immunization. / 1 dose if you are age 95 to 53 and a first-year college student living in a residence hall, or have one of several medical conditions, you need to get vaccinated against meningococcal disease. You may also need additional booster doses.  Varicella immunization.** / Consult your caregiver.  Hepatitis A immunization.** / Consult your caregiver. 2 doses, 6 to 18 months apart.  Hepatitis B immunization.** / Consult your caregiver. 3 doses usually over 6 months. Ages 14 to 12  Blood pressure check.** / Every 1 to 2 years.  Lipid and cholesterol check.** / Every 5 years beginning at age 47.  Clinical breast exam.** / Every year after age 34.  Mammogram.** /  Every year beginning at age 74 and continuing for as long as you are in  good health. Consult with your caregiver.  Pap test.** / Every 3 years starting at age 42 through age 30 or 24 with a history of 3 consecutive normal Pap tests.  HPV screening.** / Every 3 years from ages 62 through ages 5 to 2 with a history of 3 consecutive normal Pap tests.  Fecal occult blood test (FOBT) of stool. / Every year beginning at age 50 and continuing until age 24. You may not need to do this test if you get a colonoscopy every 10 years.  Flexible sigmoidoscopy or colonoscopy.** / Every 5 years for a flexible sigmoidoscopy or every 10 years for a colonoscopy beginning at age 65 and continuing until age 44.  Hepatitis C blood test.** / For all people born from [birth year] through 1965 and any individual with known risks for hepatitis C.  Skin self-exam. / Monthly.  Influenza immunization.** / Every year.  Pneumococcal polysaccharide immunization.** / 1 to 2 doses if you smoke cigarettes or if you have certain chronic medical conditions.  Tetanus, diphtheria, pertussis (Tdap, Td) immunization.** / A one-time dose of Tdap vaccine. After that, you need a Td booster dose every 10 years.  Measles, mumps, rubella (MMR) immunization. / You need at least 1 dose of MMR if you were born in 1957 or later. You may also need a second dose.  Varicella immunization.** / Consult your caregiver.  Meningococcal immunization.** / Consult your caregiver.  Hepatitis A immunization.** / Consult your caregiver. 2 doses, 6 to 18 months apart.  Hepatitis B immunization.** / Consult your caregiver. 3 doses, usually over 6 months. Ages 94 and over  Blood pressure check.** / Every 1 to 2 years.  Lipid and cholesterol check.** / Every 5 years beginning at age 58.  Clinical breast exam.** / Every year after age 33.  Mammogram.** / Every year beginning at age 61 and continuing for as long as you are in good health. Consult with your caregiver.  Pap test.** / Every 3 years starting at age 8 through  age 71 or 39 with a 3 consecutive normal Pap tests. Testing can be stopped between 65 and 70 with 3 consecutive normal Pap tests and no abnormal Pap or HPV tests in the past 10 years.  HPV screening.** / Every 3 years from ages 89 through ages 27 or 66 with a history of 3 consecutive normal Pap tests. Testing can be stopped between 65 and 70 with 3 consecutive normal Pap tests and no abnormal Pap or HPV tests in the past 10 years.  Fecal occult blood test (FOBT) of stool. / Every year beginning at age 82 and continuing until age 53. You may not need to do this test if you get a colonoscopy every 10 years.  Flexible sigmoidoscopy or colonoscopy.** / Every 5 years for a flexible sigmoidoscopy or every 10 years for a colonoscopy beginning at age 37 and continuing until age 26.  Hepatitis C blood test.** / For all people born from [birth year] through 1963 and any individual with known risks for hepatitis C.  Osteoporosis screening.** / A one-time screening for women ages 69 and over and women at risk for fractures or osteoporosis.  Skin self-exam. / Monthly.  Influenza immunization.** / Every year.  Pneumococcal polysaccharide immunization.** / 1 dose at age 72 (or older) if you have never been vaccinated.  Tetanus, diphtheria, pertussis (Tdap, Td) immunization. / A one-time dose of  Tdap vaccine if you are over 65 and have contact with an infant, are a Research scientist (physical sciences), or simply want to be protected from whooping cough. After that, you need a Td booster dose every 10 years.  Varicella immunization.** / Consult your caregiver.  Meningococcal immunization.** / Consult your caregiver.  Hepatitis A immunization.** / Consult your caregiver. 2 doses, 6 to 18 months apart.  Hepatitis B immunization.** / Check with your caregiver. 3 doses, usually over 6 months. ** Family history and personal history of risk and conditions may change your caregiver's recommendations. Document Released: 07/03/2001 Document  Revised: 07/30/2011 Document Reviewed: 10/02/2010 [facility] Patient Information 2014 [place], [place].

## 2012-11-05 NOTE — Assessment & Plan Note (Addendum)
Pap today no concerns on exam. Refill given on OCP today

## 2012-11-05 NOTE — Assessment & Plan Note (Signed)
Well controlled today no changes 

## 2012-11-05 NOTE — Progress Notes (Signed)
Patient ID: Vickie Bennett, female   DOB: [birth year], 37 y.o.   MRN: [id number] Vickie Bennett [id number] 08-17-1975 11/05/2012      Progress Note-Follow Up  Subjective  Chief Complaint  Chief Complaint  Patient presents with  . Annual Exam    physical  . Gynecologic Exam    pap and breast exam    HPI  Caucasian female who is in today for annual exam. She's doing well. She reports to sertraline continues to work well. Her anxiety is very well-controlled. She uses alprazolam. Frequently. She mostly uses it for sleep occasionally. She does continue to have both falling asleep and staying asleep most notably falling asleep. Has not tried any over-the-counter medications. She's not had any recent illness. She denies any fevers, headaches, chest pain, palpitations, shortness of breath, GI or GU concerns.  Past Medical History  Diagnosis Date  . Anxiety   . Hypertension   . TACHYCARDIA 04/21/2010  . INSOMNIA 04/21/2010  . HYPERLIPIDEMIA 04/21/2010  . ANXIETY DISORDER 04/21/2010  . Elevated BP 03/16/2012    Past Surgical History  Procedure Laterality Date  . Cesarean section  2007  . Leep  2005  . Dilation and curettage of uterus      incomplete miscarriage  . 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 Hypertension Father   . Arthritis Father   . Migraines Mother   . Interstitial cystitis Mother   . Ulcers Maternal Grandmother     Peptic  . Anemia Maternal Grandmother   . Heart disease Maternal Grandfather   . Heart disease Paternal Grandfather   . Alzheimer's disease Paternal Grandmother   . Other Paternal Grandmother     Possible PE    History   Social History  . Marital Status: Single    Spouse Name: N/A    Number of Children: N/A  . Years of Education: N/A   Occupational History  . Not on file.   Social History Main Topics  . Smoking status: Former Smoker    Quit date: 02/05/2009  .  Smokeless tobacco: Never Used  . Alcohol Use: 0.0 oz/week    0 drink(s) per week     Comment: occasionaly/ very rare  . Drug Use: No  . Sexually Active: Yes -- Female partner(s)   Other Topics Concern  . Not on file   Social History Narrative  . No narrative on file    No current outpatient prescriptions on file prior to visit.   No current facility-administered medications on file prior to visit.    Allergies  Allergen Reactions  . Sulfonamide Derivatives     Review of Systems  Review of Systems  Constitutional: Negative for fever, chills and malaise/fatigue.  HENT: Negative for hearing loss, nosebleeds and congestion.   Eyes: Negative for discharge.  Respiratory: Negative for cough, sputum production, shortness of breath and wheezing.   Cardiovascular: Negative for chest pain, palpitations and leg swelling.  Gastrointestinal: Negative for heartburn, nausea, vomiting, abdominal pain, diarrhea, constipation and blood in stool.  Genitourinary: Negative for dysuria, urgency, frequency and hematuria.  Musculoskeletal: Negative for myalgias, back pain and falls.  Skin: Negative for rash.  Neurological: Negative for dizziness, tremors, sensory change, focal weakness, loss of consciousness, weakness and headaches.  Endo/Heme/Allergies: Negative for polydipsia. Does not bruise/bleed easily.  Psychiatric/Behavioral: Negative for depression and suicidal ideas. The patient is nervous/anxious  and has insomnia.     Objective  BP 102/68  Pulse 82  Temp(Src) 98.1 F (36.7 C) (Oral)  Ht 5' 2.75" (1.594 m)  Wt 120 lb (54.432 kg)  BMI 21.42 kg/m2  SpO2 98%  LMP 10/15/2012  Physical Exam  Physical Exam  Constitutional: She is oriented to person, place, and time and well-developed, well-nourished, and in no distress. No distress.  HENT:  Head: Normocephalic and atraumatic.  Right Ear: External ear normal.  Left Ear: External ear normal.  Nose: Nose normal.  Mouth/Throat:  Oropharynx is clear and moist. No oropharyngeal exudate.  Eyes: Conjunctivae are normal. Pupils are equal, round, and reactive to light. Right eye exhibits no discharge. Left eye exhibits no discharge. No scleral icterus.  Neck: Normal range of motion. Neck supple. No thyromegaly present.  Cardiovascular: Normal rate, regular rhythm, normal heart sounds and intact distal pulses.   No murmur heard. Pulmonary/Chest: Effort normal and breath sounds normal. No respiratory distress. She has no wheezes. She has no rales.  Abdominal: Soft. Bowel sounds are normal. She exhibits no distension and no mass. There is no tenderness.  Musculoskeletal: Normal range of motion. She exhibits no edema and no tenderness.  Lymphadenopathy:    She has no cervical adenopathy.  Neurological: She is alert and oriented to person, place, and time. She has normal reflexes. No cranial nerve deficit. Coordination normal.  Skin: Skin is warm and dry. No rash noted. She is not diaphoretic.  Psychiatric: Mood, memory and affect normal.    Lab Results  Component Value Date   TSH 3.768 09/05/2011   Lab Results  Component Value Date   WBC 7.5 09/05/2011   HGB 14.7 09/05/2011   HCT 44.2 09/05/2011   MCV 95.7 09/05/2011   PLT 159 09/05/2011   Lab Results  Component Value Date   CREATININE 0.65 09/05/2011   BUN 8 09/05/2011   NA 142 09/05/2011   K 4.3 09/05/2011   CL 106 09/05/2011   CO2 26 09/05/2011   Lab Results  Component Value Date   ALT 11 09/05/2011   AST 15 09/05/2011   ALKPHOS 58 09/05/2011   BILITOT 0.4 09/05/2011   Lab Results  Component Value Date   CHOL 161 09/05/2011   Lab Results  Component Value Date   HDL 67 09/05/2011   Lab Results  Component Value Date   LDLCALC 73 09/05/2011   Lab Results  Component Value Date   TRIG 106 09/05/2011   Lab Results  Component Value Date   CHOLHDL 2.4 09/05/2011     Assessment & Plan  Cervical cancer screening Pap today no concerns on exam. Refill given on  OCP today  ANXIETY DISORDER Continues to do well on Sertraline, given refill on Sertraline and Alprazolam, using Alprazolam very infrequently.   INSOMNIA Will try Benadryl prn and discussed sleep hygiene once more  Elevated BP Well controlled today no changes

## 2012-11-05 NOTE — Assessment & Plan Note (Signed)
Will try Benadryl prn and discussed sleep hygiene once more

## 2012-11-10 NOTE — Progress Notes (Signed)
Quick Note:  Patient Informed and voiced understanding ______ 

## 2012-11-18 ENCOUNTER — Encounter: Payer: Self-pay | Admitting: Family Medicine

## 2012-11-18 ENCOUNTER — Other Ambulatory Visit (HOSPITAL_COMMUNITY)
Admission: RE | Admit: 2012-11-18 | Discharge: 2012-11-18 | Disposition: A | Discharge status: Home / Self Care | Payer: BC Managed Care – PPO | Source: Ambulatory Visit | Attending: Family Medicine | Admitting: Family Medicine

## 2012-11-18 ENCOUNTER — Ambulatory Visit (INDEPENDENT_AMBULATORY_CARE_PROVIDER_SITE_OTHER): Payer: BLUE CROSS/BLUE SHIELD | Admitting: Family Medicine

## 2012-11-18 VITALS — BP 102/80 | HR 80 | Temp 99.2°F | Ht 62.75 in | Wt 119.0 lb

## 2012-11-18 DIAGNOSIS — Z01419 Encounter for gynecological examination (general) (routine) without abnormal findings: Secondary | ICD-10-CM | POA: Insufficient documentation

## 2012-11-18 DIAGNOSIS — Z124 Encounter for screening for malignant neoplasm of cervix: Secondary | ICD-10-CM

## 2012-11-18 NOTE — Progress Notes (Signed)
Patient ID: Vickie Bennett, female   DOB: 22-Aug-1975, 37 y.o.   MRN: [id number] Vickie Bennett [id number] 03/21/1976 11/18/2012      Progress Note-Follow Up  Subjective  Chief Complaint  Chief Complaint  Patient presents with  . repeat pap    HPI  Patient is a 37 year old Caucasian female who is in today for repeat Pap. Her previous specimen in June showed some contamination so she's here for repeat. She denies any concerns. She's just coming off her cycle. No recent illness. No abdominal or back pain. No fevers. No GYN complaints or discharge is noted. Past Medical History  Diagnosis Date  . Anxiety   . Hypertension   . TACHYCARDIA 04/21/2010  . INSOMNIA 04/21/2010  . HYPERLIPIDEMIA 04/21/2010  . ANXIETY DISORDER 04/21/2010  . Elevated BP 03/16/2012    Past Surgical History  Procedure Laterality Date  . Cesarean section  2007  . Leep  2005  . Dilation and curettage of uterus      incomplete miscarriage  . 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 Marital Status: Single    Spouse Name: N/A    Number of Children: N/A  . Years of Education: N/A   Occupational History  . Not on file.   Social History Main Topics  . Smoking status: Former Smoker    Quit date: 02/05/2009  . Smokeless tobacco: Never Used  . Alcohol Use: 0.0 oz/week    0 drink(s) per week     Comment: occasionaly/ very rare  . Drug Use: No  . Sexually Active: Yes -- Female partner(s)   Other Topics Concern  . Not on file   Social History Narrative  . No narrative on file    Current Outpatient Prescriptions on File Prior to Visit  Medication Sig Dispense Refill  . ALPRAZolam (XANAX) 0.25 MG tablet Take 1 tablet (0.25 mg total) by mouth as needed for sleep or anxiety. 1/2 to 1 tab  For anxiety  40 tablet  1  . metoprolol succinate (TOPROL-XL) 25 MG 24 hr tablet Take 1 tablet (25 mg total) by mouth daily.  90 tablet  3  . norethindrone-ethinyl  estradiol (TRIPHASIL,CYCLAFEM,ALYACEN) 0.5/0.75/1-35 MG-MCG tablet Take 1 tablet by mouth daily.  3 Package  3  . sertraline (ZOLOFT) 50 MG tablet Take 1 tablet (50 mg total) by mouth daily.  90 tablet  3   No current facility-administered medications on file prior to visit.    Allergies  Allergen Reactions  . Sulfonamide Derivatives     Review of Systems  Review of Systems  Constitutional: Negative for fever, chills and malaise/fatigue.  Cardiovascular: Negative for chest pain.  Gastrointestinal: Negative for abdominal pain and constipation.  Genitourinary: Negative for dysuria and urgency.  Musculoskeletal: Negative for myalgias.  Neurological: Negative for loss of consciousness and headaches.  Psychiatric/Behavioral: Negative for depression.   Objective  BP 102/80  Pulse 80  Temp(Src) 99.2 F (37.3 C) (Oral)  Ht 5' 2.75" (1.594 m)  Wt 119 lb 0.6 oz (53.996 kg)  BMI 21.25 kg/m2  SpO2 97%  LMP 11/15/2012  Physical Exam  Physical Exam  Constitutional: She is oriented to person, place, and time and well-developed, well-nourished, and in no distress. No distress.  HENT:  Head: Atraumatic.  Eyes: Conjunctivae are normal.  Neck: Neck supple. No thyromegaly present.  Cardiovascular: Normal rate, regular rhythm and normal heart sounds.  Pulmonary/Chest: Effort normal and breath sounds normal. She has no wheezes.  Abdominal: Soft. Bowel sounds are normal. There is no tenderness. There is no rebound and no guarding.  Genitourinary: Uterus normal and cervix normal. Vaginal discharge found.  Scant dry blood at cervical os  Musculoskeletal: Normal range of motion. She exhibits no edema.  Neurological: She is alert and oriented to person, place, and time.  Skin: Skin is warm and dry. She is not diaphoretic.  Psychiatric: Mood, affect and judgment normal.    Assessment & Plan  Cervical cancer screening Recent pap came back as unusable, patient without c/o. Does have some  scant dark blood, otherwise exam unremarkable. Pap taken today.

## 2012-11-18 NOTE — Assessment & Plan Note (Signed)
Recent pap came back as unusable, patient without c/o. Does have some scant dark blood, otherwise exam unremarkable. Pap taken today.

## 2012-12-10 ENCOUNTER — Other Ambulatory Visit: Payer: Self-pay | Admitting: Obstetrics and Gynecology

## 2012-12-10 NOTE — Telephone Encounter (Signed)
Pt was seen by PCP on 11/05/12 for AEX with Pap.  Pt was given one year refill at that time.  RX denied.

## 2013-01-03 ENCOUNTER — Other Ambulatory Visit: Payer: Self-pay | Admitting: Family Medicine

## 2013-03-26 ENCOUNTER — Other Ambulatory Visit: Payer: Self-pay

## 2013-05-04 ENCOUNTER — Encounter: Payer: Self-pay | Admitting: Family Medicine

## 2013-05-04 ENCOUNTER — Ambulatory Visit (INDEPENDENT_AMBULATORY_CARE_PROVIDER_SITE_OTHER): Payer: BC Managed Care – PPO | Admitting: Family Medicine

## 2013-05-04 VITALS — BP 110/68 | HR 85 | Temp 97.8°F | Ht 62.75 in | Wt 128.0 lb

## 2013-05-04 DIAGNOSIS — F419 Anxiety disorder, unspecified: Secondary | ICD-10-CM

## 2013-05-04 DIAGNOSIS — R000 Tachycardia, unspecified: Secondary | ICD-10-CM

## 2013-05-04 DIAGNOSIS — Z23 Encounter for immunization: Secondary | ICD-10-CM

## 2013-05-04 DIAGNOSIS — F411 Generalized anxiety disorder: Secondary | ICD-10-CM

## 2013-05-04 DIAGNOSIS — E785 Hyperlipidemia, unspecified: Secondary | ICD-10-CM

## 2013-05-04 MED ORDER — ALPRAZOLAM 0.25 MG PO TABS: 0.2500 mg | ORAL_TABLET | ORAL | Status: DC | PRN

## 2013-05-04 MED ORDER — SERTRALINE HCL 50 MG PO TABS: 50.0000 mg | ORAL_TABLET | Freq: Every day | ORAL | Status: DC

## 2013-05-04 NOTE — Progress Notes (Signed)
Pre visit review using our clinic review tool, if applicable. No additional management support is needed unless otherwise documented below in the visit note. 

## 2013-05-04 NOTE — Patient Instructions (Signed)

## 2013-05-10 NOTE — Assessment & Plan Note (Signed)
Mild, encouraged to avoid simple carbs and trans fats.

## 2013-05-10 NOTE — Assessment & Plan Note (Signed)
Doing very well on current dose of meds given refills today.

## 2013-05-10 NOTE — Progress Notes (Signed)
Patient ID: Vickie Bennett, female   DOB: 04-09-1976, 37 y.o.   MRN: [id number] Vickie Bennett [id number] 02-05-1976 05/10/2013      Progress Note-Follow Up  Subjective  Chief Complaint  Chief Complaint  Patient presents with  . Follow-up    6 month  . Injections    flu    HPI  Patient is a 37 year old Caucasian female in today for followup. She feels well today. She denies any recent illness. She is taking medications as prescribed in her anxiety and palpitations are well-controlled. No recent illness. No chest pain or palpitations. No shortness of breath GI or GU concerns. Takes alprazolam very frequently due to sedation  Past Medical History  Diagnosis Date  . Anxiety   . Hypertension   . TACHYCARDIA 04/21/2010  . INSOMNIA 04/21/2010  . HYPERLIPIDEMIA 04/21/2010  . ANXIETY DISORDER 04/21/2010  . Elevated BP 03/16/2012    Past Surgical History  Procedure Laterality Date  . Cesarean section  2007  . Leep  2005  . Dilation and curettage of uterus      incomplete miscarriage  . 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 Hypertension Father   . Arthritis Father   . Migraines Mother   . Interstitial cystitis Mother   . Ulcers Maternal Grandmother     Peptic  . Anemia Maternal Grandmother   . Heart disease Maternal Grandfather   . Heart disease Paternal Grandfather   . Alzheimer's disease Paternal Grandmother   . Other Paternal Grandmother     Possible PE    History   Social History  . Marital Status: Single    Spouse Name: N/A    Number of Children: N/A  . Years of Education: N/A   Occupational History  . Not on file.   Social History Main Topics  . Smoking status: Former Smoker    Quit date: 02/05/2009  . Smokeless tobacco: Never Used  . Alcohol Use: 0.0 oz/week    0 drink(s) per week     Comment: occasionaly/ very rare  . Drug Use: No  . Sexual Activity: Yes    Partners: Male    Other Topics Concern  . Not on file   Social History Narrative  . No narrative on file    Current Outpatient Prescriptions on File Prior to Visit  Medication Sig Dispense Refill  . metoprolol succinate (TOPROL-XL) 25 MG 24 hr tablet Take 1 tablet (25 mg total) by mouth daily.  90 tablet  3  . norethindrone-ethinyl estradiol (TRIPHASIL,CYCLAFEM,ALYACEN) 0.5/0.75/1-35 MG-MCG tablet Take 1 tablet by mouth daily.  3 Package  3   No current facility-administered medications on file prior to visit.    Allergies  Allergen Reactions  . Sulfonamide Derivatives     Review of Systems  Review of Systems  Constitutional: Negative for fever and malaise/fatigue.  HENT: Negative for congestion.   Eyes: Negative for discharge.  Respiratory: Negative for shortness of breath.   Cardiovascular: Negative for chest pain, palpitations and leg swelling.  Gastrointestinal: Negative for nausea, abdominal pain and diarrhea.  Genitourinary: Negative for dysuria.  Musculoskeletal: Negative for falls.  Skin: Negative for rash.  Neurological: Negative for loss of consciousness and headaches.  Endo/Heme/Allergies: Negative for polydipsia.  Psychiatric/Behavioral: Negative for depression and suicidal ideas. The patient is not nervous/anxious and does not have insomnia.     Objective  BP  110/68  Pulse 85  Temp(Src) 97.8 F (36.6 C) (Oral)  Ht 5' 2.75" (1.594 m)  Wt 128 lb (58.06 kg)  BMI 22.85 kg/m2  SpO2 95%  LMP 04/27/2013  Physical Exam  Physical Exam  Constitutional: She is oriented to person, place, and time and well-developed, well-nourished, and in no distress. No distress.  HENT:  Head: Normocephalic and atraumatic.  Eyes: Conjunctivae are normal.  Neck: Neck supple. No thyromegaly present.  Cardiovascular: Normal rate, regular rhythm and normal heart sounds.   No murmur heard. Pulmonary/Chest: Effort normal and breath sounds normal. She has no wheezes.  Abdominal: She exhibits  no distension and no mass.  Musculoskeletal: She exhibits no edema.  Lymphadenopathy:    She has no cervical adenopathy.  Neurological: She is alert and oriented to person, place, and time.  Skin: Skin is warm and dry. No rash noted. She is not diaphoretic.  Psychiatric: Memory, affect and judgment normal.    Lab Results  Component Value Date   TSH 3.768 09/05/2011   Lab Results  Component Value Date   WBC 7.5 09/05/2011   HGB 14.7 09/05/2011   HCT 44.2 09/05/2011   MCV 95.7 09/05/2011   PLT 159 09/05/2011   Lab Results  Component Value Date   CREATININE 0.65 09/05/2011   BUN 8 09/05/2011   NA 142 09/05/2011   K 4.3 09/05/2011   CL 106 09/05/2011   CO2 26 09/05/2011   Lab Results  Component Value Date   ALT 11 09/05/2011   AST 15 09/05/2011   ALKPHOS 58 09/05/2011   BILITOT 0.4 09/05/2011   Lab Results  Component Value Date   CHOL 161 09/05/2011   Lab Results  Component Value Date   HDL 67 09/05/2011   Lab Results  Component Value Date   LDLCALC 73 09/05/2011   Lab Results  Component Value Date   TRIG 106 09/05/2011   Lab Results  Component Value Date   CHOLHDL 2.4 09/05/2011     Assessment & Plan  ANXIETY DISORDER Doing very well on current dose of meds given refills today.  HYPERLIPIDEMIA Mild, encouraged to avoid simple carbs and trans fats.  TACHYCARDIA Well controlled on current meds, no changes

## 2013-05-10 NOTE — Assessment & Plan Note (Signed)
Well controlled on current meds, no changes 

## 2013-10-05 ENCOUNTER — Encounter: Payer: Self-pay | Admitting: Family Medicine

## 2013-10-05 ENCOUNTER — Ambulatory Visit (INDEPENDENT_AMBULATORY_CARE_PROVIDER_SITE_OTHER): Payer: BC Managed Care – PPO | Admitting: Family Medicine

## 2013-10-05 VITALS — BP 110/72 | HR 101 | Temp 98.1°F | Ht 62.75 in | Wt 120.1 lb

## 2013-10-05 DIAGNOSIS — R000 Tachycardia, unspecified: Secondary | ICD-10-CM

## 2013-10-05 DIAGNOSIS — IMO0001 Reserved for inherently not codable concepts without codable children: Secondary | ICD-10-CM

## 2013-10-05 DIAGNOSIS — B070 Plantar wart: Secondary | ICD-10-CM

## 2013-10-05 DIAGNOSIS — E785 Hyperlipidemia, unspecified: Secondary | ICD-10-CM

## 2013-10-05 DIAGNOSIS — Z0000 Encounter for general adult medical examination without abnormal findings: Secondary | ICD-10-CM

## 2013-10-05 DIAGNOSIS — R030 Elevated blood-pressure reading, without diagnosis of hypertension: Secondary | ICD-10-CM

## 2013-10-05 LAB — HEPATIC FUNCTION PANEL
ALK PHOS: 53 U/L (ref 39–117)
ALT: 13 U/L (ref 0–35)
AST: 16 U/L (ref 0–37)
Albumin: 4.1 g/dL (ref 3.5–5.2)
BILIRUBIN INDIRECT: 0.4 mg/dL (ref 0.2–1.2)
BILIRUBIN TOTAL: 0.5 mg/dL (ref 0.2–1.2)
Bilirubin, Direct: 0.1 mg/dL (ref 0.0–0.3)
Total Protein: 6.6 g/dL (ref 6.0–8.3)

## 2013-10-05 LAB — CBC
HEMATOCRIT: 40.5 % (ref 36.0–46.0)
Hemoglobin: 13.7 g/dL (ref 12.0–15.0)
MCH: 31.1 pg (ref 26.0–34.0)
MCHC: 33.8 g/dL (ref 30.0–36.0)
MCV: 92 fL (ref 78.0–100.0)
PLATELETS: 181 10*3/uL (ref 150–400)
RBC: 4.4 MIL/uL (ref 3.87–5.11)
RDW: 13.2 % (ref 11.5–15.5)
WBC: 4.7 10*3/uL (ref 4.0–10.5)

## 2013-10-05 LAB — LIPID PANEL
CHOLESTEROL: 145 mg/dL (ref 0–200)
HDL: 58 mg/dL (ref >39)
LDL Cholesterol: 69 mg/dL (ref 0–99)
TRIGLYCERIDES: 89 mg/dL (ref <150)
Total CHOL/HDL Ratio: 2.5 Ratio
VLDL: 18 mg/dL (ref 0–40)

## 2013-10-05 LAB — RENAL FUNCTION PANEL
Albumin: 4.1 g/dL (ref 3.5–5.2)
BUN: 11 mg/dL (ref 6–23)
CHLORIDE: 104 meq/L (ref 96–112)
CO2: 26 meq/L (ref 19–32)
Calcium: 9.6 mg/dL (ref 8.4–10.5)
Creat: 0.74 mg/dL (ref 0.50–1.10)
Glucose, Bld: 89 mg/dL (ref 70–99)
PHOSPHORUS: 3.1 mg/dL (ref 2.3–4.6)
POTASSIUM: 5 meq/L (ref 3.5–5.3)
Sodium: 137 mEq/L (ref 135–145)

## 2013-10-05 NOTE — Progress Notes (Signed)
Pre visit review using our clinic review tool, if applicable. No additional management support is needed unless otherwise documented below in the visit note. 

## 2013-10-05 NOTE — Patient Instructions (Signed)
Folic acid daily Compound W nightly   Plantar Wart Warts are benign (noncancerous) growths of the outer skin layer. They can occur at any time in life but are most common during childhood and the teen years. Warts can occur on many skin surfaces of the body. When they occur on the underside (sole) of your foot they are called plantar warts. They often emerge in groups with several small warts encircling a larger growth. CAUSES  Human papillomavirus (HPV) is the cause of plantar warts. HPV attacks a break in the skin of the foot. Walking barefoot can lead to exposure to the wart virus. Plantar warts tend to develop over areas of pressure such as the heel and ball of the foot. Plantar warts often grow into the deeper layers of skin. They may spread to other areas of the sole but cannot spread to other areas of the body. SYMPTOMS  You may also notice a growth on the undersurface of your foot. The wart may grow directly into the sole of the foot, or rise above the surface of the skin on the sole of the foot, or both. They are most often flat from pressure. Warts generally do not cause itching but may cause pain in the area of the wart when you put weight on your foot. DIAGNOSIS  Diagnosis is made by physical examination. This means your caregiver discovers it while examining your foot.  TREATMENT  There are many ways to treat plantar warts. However, warts are very tough. Sometimes it is difficult to treat them so that they go away completely and do not grow back. Any treatment must be done regularly to work. If left untreated, most plantar warts will eventually disappear over a period of one to two years. Treatments you can do at home include:  Putting duct tape over the top of the wart (occlusion), has been found to be effective over several months. The duct tape should be removed each night and reapplied until the wart has disappeared.  Placing over-the-counter medications on top of the wart to help  kill the wart virus and remove the wart tissue (salicylic acid, cantharidin, and dichloroacetic acid ) are useful. These are called keratolytic agents. These medications make the skin soft and gradually layers will shed away. Theses compounds are usually placed on the wart each night and then covered with a band-aid. They are also available in pre-medicated band-aid form. Avoid surrounding skin when applying these liquids as these medications can burn healthy skin. The treatment may take several months of nightly use to be effective.  Cryotherapy to freeze the wart has recently become available over-the-counter for children 4 years and older. This system makes use of a soft narrow applicator connected to a bottle of compressed cold liquid that is applied directly to the wart. This medication can burn health skin and should be used with caution.  As with all over-the-counter medications, read the directions carefully before use. Treatments generally done in your caregiver's office include:  Some aggressive treatments may cause discomfort, discoloration and scaring of the surrounding skin. The risks and benefits of treatment should be discussed with your caregiver.  Freezing the wart with liquid nitrogen (cryotherapy, see above).  Burning the wart with use of very high heat (cautery).  Injecting medication into the wart.  Surgically removing or laser treatment of the wart.  Your caregiver may refer you to a dermatologist for difficult to treat, large sized or large numbers of warts. HOME CARE INSTRUCTIONS  Soak the affected area in warm water. Dry the area completely when you are done. Remove the top layer of softened skin, then apply the chosen topical medication and reapply a bandage.  Remove the bandage daily and file excess wart tissue (pumice stone works well for this purpose). Repeat the entire process daily or every other day for weeks until the plantar wart disappears.  Several brands  of salicylic acid pads are available as over-the-counter remedies.  Pain can be relieved by wearing a doughnut bandage. This is a bandage with a hole in it. The bandage is put on with the hole over the wart. This helps take the pressure off the wart and gives pain relief. To help prevent plantar warts:  Wear shoes and socks and change them daily.  Keep feet clean and dry.  Check your feet and your children's feet regularly.  Avoid direct contact with warts on other people.  Have growths, or changes on your skin checked by your caregiver. Document Released: 07/28/2003 Document Revised: 07/30/2011 Document Reviewed: 01/05/2009 [facility] Patient Information 2014 [employer].

## 2013-10-06 LAB — TSH: TSH: 2.261 u[IU]/mL (ref 0.350–4.500)

## 2013-10-07 ENCOUNTER — Encounter: Payer: Self-pay | Admitting: Family Medicine

## 2013-10-07 DIAGNOSIS — B070 Plantar wart: Secondary | ICD-10-CM | POA: Insufficient documentation

## 2013-10-07 NOTE — Assessment & Plan Note (Signed)
Well controlled, no changes to meds. Encouraged heart healthy diet such as the DASH diet and exercise as tolerated.  °

## 2013-10-07 NOTE — Progress Notes (Signed)
Patient ID: Vickie Bennett, female   DOB: Jul 03, 1975, 38 y.o.   MRN: [id number] Vickie Bennett [id number] 1976/04/03 10/07/2013      Progress Note-Follow Up  Subjective  Chief Complaint  Chief Complaint  Patient presents with  . something in foot    right foot, for about 2 weeks    HPI  Patient is a 38 year old female in today for routine medical care. In today complaining of a irritated lesion on the base of her left foot. She thinks she got something in it but she doesn't recall actually doing so. She reports she has been taking it hoping it would clear up but it has not. No increasing pain, fevers, chills, malaise or myalgias. Denies CP/palp/SOB/HA/congestion/fevers/GI or GU c/o. Taking meds as prescribed  Past Medical History  Diagnosis Date  . Anxiety   . Hypertension   . TACHYCARDIA 04/21/2010  . INSOMNIA 04/21/2010  . HYPERLIPIDEMIA 04/21/2010  . ANXIETY DISORDER 04/21/2010  . Elevated BP 03/16/2012  . Plantar wart of right foot 10/07/2013    Past Surgical History  Procedure Laterality Date  . Cesarean section  2007  . Leep  2005  . Dilation and curettage of uterus      incomplete miscarriage  . 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 Hypertension Father   . Arthritis Father   . Migraines Mother   . Interstitial cystitis Mother   . Ulcers Maternal Grandmother     Peptic  . Anemia Maternal Grandmother   . Heart disease Maternal Grandfather   . Heart disease Paternal Grandfather   . Alzheimer's disease Paternal Grandmother   . Other Paternal Grandmother     Possible PE    History   Social History  . Marital Status: Single    Spouse Name: N/A    Number of Children: N/A  . Years of Education: N/A   Occupational History  . Not on file.   Social History Main Topics  . Smoking status: Former Smoker    Quit date: 02/05/2009  . Smokeless tobacco: Never Used  . Alcohol Use: 0.0  oz/week    0 drink(s) per week     Comment: occasionaly/ very rare  . Drug Use: No  . Sexual Activity: Yes    Partners: Male   Other Topics Concern  . Not on file   Social History Narrative  . No narrative on file    Current Outpatient Prescriptions on File Prior to Visit  Medication Sig Dispense Refill  . ALPRAZolam (XANAX) 0.25 MG tablet Take 1 tablet (0.25 mg total) by mouth as needed for sleep or anxiety. 1/2 to 1 tab  For anxiety  40 tablet  1  . metoprolol succinate (TOPROL-XL) 25 MG 24 hr tablet Take 1 tablet (25 mg total) by mouth daily.  90 tablet  3  . norethindrone-ethinyl estradiol (TRIPHASIL,CYCLAFEM,ALYACEN) 0.5/0.75/1-35 MG-MCG tablet Take 1 tablet by mouth daily.  3 Package  3  . sertraline (ZOLOFT) 50 MG tablet Take 1 tablet (50 mg total) by mouth daily.  90 tablet  2   No current facility-administered medications on file prior to visit.    Allergies  Allergen Reactions  . Sulfonamide Derivatives     Review of Systems  Review of Systems  Constitutional: Negative for fever and malaise/fatigue.  HENT: Negative for congestion.   Eyes: Negative for discharge.  Respiratory: Negative for shortness of breath.   Cardiovascular: Negative for chest pain, palpitations and leg swelling.  Gastrointestinal: Negative for nausea, abdominal pain and diarrhea.  Genitourinary: Negative for dysuria.  Musculoskeletal: Negative for falls.  Skin: Negative for rash.  Neurological: Negative for loss of consciousness and headaches.  Endo/Heme/Allergies: Negative for polydipsia.  Psychiatric/Behavioral: Negative for depression and suicidal ideas. The patient is not nervous/anxious and does not have insomnia.     Objective  BP 110/72  Pulse 101  Temp(Src) 98.1 F (36.7 C) (Oral)  Ht 5' 2.75" (1.594 m)  Wt 120 lb 1.3 oz (54.468 kg)  BMI 21.44 kg/m2  SpO2 97%  LMP 09/21/2013  Physical Exam  Physical Exam  Constitutional: She is oriented to person, place, and time and  well-developed, well-nourished, and in no distress. No distress.  HENT:  Head: Normocephalic and atraumatic.  Eyes: Conjunctivae are normal.  Neck: Neck supple. No thyromegaly present.  Cardiovascular: Normal rate, regular rhythm and normal heart sounds.   No murmur heard. Pulmonary/Chest: Effort normal and breath sounds normal. She has no wheezes.  Abdominal: She exhibits no distension and no mass.  Musculoskeletal: She exhibits no edema.  Lymphadenopathy:    She has no cervical adenopathy.  Neurological: She is alert and oriented to person, place, and time.  Skin: Skin is warm and dry. No rash noted. She is not diaphoretic.  4 mm verrucous lesion on base of left foot  Psychiatric: Memory, affect and judgment normal.    Lab Results  Component Value Date   TSH 2.261 10/05/2013   Lab Results  Component Value Date   WBC 4.7 10/05/2013   HGB 13.7 10/05/2013   HCT 40.5 10/05/2013   MCV 92.0 10/05/2013   PLT 181 10/05/2013   Lab Results  Component Value Date   CREATININE 0.74 10/05/2013   BUN 11 10/05/2013   NA 137 10/05/2013   K 5.0 10/05/2013   CL 104 10/05/2013   CO2 26 10/05/2013   Lab Results  Component Value Date   ALT 13 10/05/2013   AST 16 10/05/2013   ALKPHOS 53 10/05/2013   BILITOT 0.5 10/05/2013   Lab Results  Component Value Date   CHOL 145 10/05/2013   Lab Results  Component Value Date   HDL 58 10/05/2013   Lab Results  Component Value Date   LDLCALC 69 10/05/2013   Lab Results  Component Value Date   TRIG 89 10/05/2013   Lab Results  Component Value Date   CHOLHDL 2.5 10/05/2013     Assessment & Plan  HYPERLIPIDEMIA Encouraged heart healthy diet, increase exercise, avoid trans fats, consider a krill oil cap daily  TACHYCARDIA Continue metoprolol daily  Elevated BP Well controlled, no changes to meds. Encouraged heart healthy diet such as the DASH diet and exercise as tolerated.   Plantar wart of right foot Encouraged topical treatments and referred  to podiatry for further treatment

## 2013-10-07 NOTE — Assessment & Plan Note (Signed)
Encouraged heart healthy diet, increase exercise, avoid trans fats, consider a krill oil cap daily 

## 2013-10-07 NOTE — Assessment & Plan Note (Signed)
Continue metoprolol daily

## 2013-10-07 NOTE — Assessment & Plan Note (Signed)
Encouraged topical treatments and referred to podiatry for further treatment

## 2013-10-16 ENCOUNTER — Encounter: Payer: Self-pay | Admitting: Family Medicine

## 2013-11-03 ENCOUNTER — Ambulatory Visit (INDEPENDENT_AMBULATORY_CARE_PROVIDER_SITE_OTHER): Payer: BC Managed Care – PPO | Admitting: Family Medicine

## 2013-11-03 ENCOUNTER — Telehealth: Payer: Self-pay

## 2013-11-03 ENCOUNTER — Encounter: Payer: Self-pay | Admitting: Family Medicine

## 2013-11-03 ENCOUNTER — Other Ambulatory Visit: Payer: Self-pay | Admitting: Family Medicine

## 2013-11-03 VITALS — BP 122/82 | HR 76 | Temp 98.8°F | Ht 62.75 in | Wt 119.1 lb

## 2013-11-03 DIAGNOSIS — F411 Generalized anxiety disorder: Secondary | ICD-10-CM

## 2013-11-03 DIAGNOSIS — M25579 Pain in unspecified ankle and joints of unspecified foot: Secondary | ICD-10-CM

## 2013-11-03 NOTE — Progress Notes (Signed)
Pre visit review using our clinic review tool, if applicable. No additional management support is needed unless otherwise documented below in the visit note. 

## 2013-11-03 NOTE — Telephone Encounter (Signed)
Referral placed.

## 2013-11-03 NOTE — Telephone Encounter (Signed)
Patient was in today and states her foot is not any better and would like to be referred. Pt states that it is still sore and it feels like something is still in it. Pt also stated she has been putting the "things" on it that Dr Charlett Blake suggested and she thinks its getting worse.  Please advise?

## 2013-11-08 ENCOUNTER — Encounter: Payer: Self-pay | Admitting: Family Medicine

## 2013-11-11 ENCOUNTER — Telehealth: Payer: Self-pay | Admitting: Family Medicine

## 2013-11-11 ENCOUNTER — Other Ambulatory Visit: Payer: Self-pay | Admitting: Family Medicine

## 2013-11-11 DIAGNOSIS — R000 Tachycardia, unspecified: Secondary | ICD-10-CM

## 2013-11-11 DIAGNOSIS — Z309 Encounter for contraceptive management, unspecified: Secondary | ICD-10-CM

## 2013-11-11 MED ORDER — NORETHIN-ETH ESTRAD TRIPHASIC 0.5/0.75/1-35 MG-MCG PO TABS: 1.0000 | ORAL_TABLET | Freq: Every day | ORAL | Status: DC

## 2013-11-11 MED ORDER — METOPROLOL SUCCINATE ER 25 MG PO TB24: 25.0000 mg | ORAL_TABLET | Freq: Every day | ORAL | Status: DC

## 2013-11-11 NOTE — Telephone Encounter (Signed)
Rxs sent

## 2013-11-11 NOTE — Telephone Encounter (Signed)
Patient left message requesting refill on her birth control and metoprolol, she is going out of town on Friday and is hoping to pick these up before she leaves

## 2013-11-11 NOTE — Telephone Encounter (Signed)
metoprolol succinate (TOPROL-XL) 25 MG 24 hr tablet 90 tablet 3 11/11/2013 Sig - Route: Take 1 tablet (25 mg total) by mouth daily. - Oral E-Prescribing Status: Receipt confirmed by pharmacy (11/11/2013 1:21 PM EDT) norethindrone-ethinyl estradiol (CYCLAFEM,ALYACEN) 0.5/0.75/1-35 MG-MCG tablet 3 Package 3 11/11/2013 Sig - Route: Take 1 tablet by mouth daily. - Oral E-Prescribing Status: Receipt confirmed by pharmacy (11/11/2013 1:21 PM EDT)  Rx request Denied; Duplicate request, already sent to pharmacy 06.24.15 via [facility]/sls

## 2013-11-11 NOTE — Telephone Encounter (Signed)
Spoke with pt, advised Rx's sent in. 

## 2013-11-23 ENCOUNTER — Ambulatory Visit: Payer: Self-pay | Admitting: Podiatry

## 2013-11-24 ENCOUNTER — Encounter: Payer: Self-pay | Admitting: Podiatry

## 2013-11-24 ENCOUNTER — Encounter: Payer: Self-pay | Admitting: *Deleted

## 2013-11-24 ENCOUNTER — Ambulatory Visit (INDEPENDENT_AMBULATORY_CARE_PROVIDER_SITE_OTHER): Payer: BC Managed Care – PPO | Admitting: Podiatry

## 2013-11-24 VITALS — BP 121/84 | HR 82 | Resp 16 | Ht 63.0 in | Wt 122.0 lb

## 2013-11-24 DIAGNOSIS — D492 Neoplasm of unspecified behavior of bone, soft tissue, and skin: Secondary | ICD-10-CM

## 2013-11-24 DIAGNOSIS — B079 Viral wart, unspecified: Secondary | ICD-10-CM

## 2013-11-24 NOTE — Patient Instructions (Addendum)
WARTS (Verrucae)  Warts are caused by a virus that has invaded the skin.  They are more common in young adults and children and a small percentage will resolve on their own.  There are many types of warts including mosaic warts (large flat), vulgaris (domed warts-have pearl like appearance), and plantar warts (flat or cauliflower like appearance).  Warts are highly contagious and may be picked up from any surface.  Warts thrive in a warm moist environment and are common near pools, showers, and locker room floors.  Any microscopic cut in the skin is where the virus enters and becomes a wart.  Warts are very difficult to treat and get rid of.  Patience is necessary in the treatment of this virus.  It may take months to cure and different methods may have to be used to get rid of your wart.  Standard Initial Treatment is: 1. Periodic debridement of the wart and application of Canthacur to each lesion (a blistering agent that will slough off the warty skin) 2. Dispensing of topical treatments/prescriptions to apply to the wart at home  Other options include: 1. Excision of the lesion-numbing the skin around the wart and cutting it out-requires daily soaks post-operatively and takes about 2-3 weeks to fully heal 2. Excision with CO2 Laser-Performed at the surgical center your foot is numbed up and the lesions are all cut out and then lasered with a high power laser.  Very good for multiple warts that are resistant. 3. Cimetidine (Tagamet)-Oral agent used in high does--has shown better results in children  How do I apply the standard topical treatments?  1. Salicylic Acid (Compound W wart remover liquid or gel-available at drug or grocery stores)-Apply a dime size thickness over the wart and cover with duct tape-apply at night so the medication does not spread out to the good skin.  The skin will turn white and slowly blister off.  Use a pumice stone daily to remove the white skin as best you can.  If  the skin gets too raw and painful, discontinue for a few days then resume. 2. Aldara (Imiquimod)-this is an immune response modifier.  They come in little packets so try to get at least 2 days out of each packet if you can.  Apply a small amount to the lesion and cover with duct tape.  Do not rub it in-let it absorb on its own.  Good to apply each morning.  Other Helpful Hints:  Wash shoes that can be washed in the washing machine 2-3 x per month with some bleach  Use Lysol in shoes that cannot be washed and wipe out with a cloth 1 x per week-allow to dry for 8 hours before wearing again  Use a bleach solution (1 part bleach to 3 parts water) in your tub or shower to reduce the spread of the virus to yourself and others Use aqua socks or clean sandals when at the pool or locker room to reduce the chance of picking up the virus or spreading it to othersWart Surgery-Directions for Home Care  You will need: Dial antibacterial hand soap, sterile gauze,  Band-aids  1. Keep the original bandage on until the following morning.  Bathe or shower with the bandage on allowing it to soak, so that when removed it won't stick to the wound. 2. After showering or bathing, remove the old bandage and cleanse the area with Dial soap and water.  Place a few drops of Dial soap and water   on a piece of guaze and gently scrub the area.  Dry with a clean piece of gauze. 3. Apply antibiotic cream (polysporin, triple antibiotic or similar) to the area and place a clean square gauze bandage over and cover with a band-aid. 4. In the evening, add a few drops of Dial soap to a basin of lukewarm water and soak your foot for 15 minutes.  After soaking, follow the instructions above for cleaning the area. 5. Continue cleansing the area as described above two times a day, applying sterile gauze dressings until the doctor informs you that it is not needed. 6. The charge for the surgical procedure includes the follow-up visits after  surgery.  Additional treatments (if necessary) are not included. 7. The time required to heal the surgical site will depend upon the size and location of the wart.  Lesions under bony prominences heal slower.  The average healing time is 2 to 4 weeks. 8. Take over the counter Ibuprofen or Tylenol as needed should you experience any discomfort 9. If you do experience discomfort after surgery, keep the foot elevated and apply an ice pack over your ankle, 30 minutes on, 30 minutes off each hour for the rest of the day.  If you have any questions , please do not hesitate to contact the office.

## 2013-11-24 NOTE — Progress Notes (Signed)
Skin specimen of the plantar forefoot left sent to [facility].

## 2013-11-24 NOTE — Progress Notes (Signed)
   Subjective:    Patient ID: Vickie Bennett, female    DOB: [birth year], 38 y.o.   MRN: [id number]  HPI Comments: "I have a wart I think"  Patient c/o tender plantar forefoot left for several months. There is a callused area that is getting larger. She has tried OTC wart meds but no help.     Review of Systems  All other systems reviewed and are negative.      Objective:   Physical Exam: I have reviewed her past medical history medications allergies surgeries social history and review of systems. She presents with a chief complaint of a painful wart plantar aspect of her left foot. Pulses are strongly palpable bilateral. Neurologic sensorium is intact per since once the monofilament. Deep tendon reflexes are intact bilateral muscle strength is 5 over 5 dorsiflexors plantar flexors inverters everters all intrinsic musculature is intact. Orthopedic evaluation demonstrates all joints distal to the ankle have a full range of motion without crepitation. Cutaneous evaluation demonstrates solitary verrucoid lesion plantar aspect of the forefoot left. Does not appear to be clinically infected.        Assessment & Plan:  Assessment: Verruca plantaris plantar aspect of the left foot.  Plan: Surgical curettage today was performed after 3 cc of 50-50 mixture of Marcaine plain and lidocaine with epinephrine was injected sublesionally. It was prepped and draped in is normal sterile fashion. Utilizing a 15 blade the lesion was circumferentially incised and lesion curetted. 3 applications of phenol were applied to the base and I will followup with her in one week. She was given both oral and written home-going instructions for the care of her surgical site. We will send this lesion to pathology should come back abnormal we will notify her immediately.

## 2013-12-03 ENCOUNTER — Encounter: Payer: Self-pay | Admitting: Podiatry

## 2013-12-15 ENCOUNTER — Ambulatory Visit: Payer: BC Managed Care – PPO | Admitting: Podiatry

## 2013-12-16 ENCOUNTER — Ambulatory Visit (INDEPENDENT_AMBULATORY_CARE_PROVIDER_SITE_OTHER): Payer: BC Managed Care – PPO | Admitting: Family Medicine

## 2013-12-16 ENCOUNTER — Encounter: Payer: Self-pay | Admitting: Family Medicine

## 2013-12-16 ENCOUNTER — Other Ambulatory Visit (HOSPITAL_COMMUNITY)
Admission: RE | Admit: 2013-12-16 | Discharge: 2013-12-16 | Disposition: A | Discharge status: Home / Self Care | Payer: BC Managed Care – PPO | Source: Ambulatory Visit | Attending: Family Medicine | Admitting: Family Medicine

## 2013-12-16 VITALS — BP 104/64 | HR 83 | Temp 98.3°F | Ht 62.75 in | Wt 122.0 lb

## 2013-12-16 DIAGNOSIS — F411 Generalized anxiety disorder: Secondary | ICD-10-CM

## 2013-12-16 DIAGNOSIS — Z01419 Encounter for gynecological examination (general) (routine) without abnormal findings: Secondary | ICD-10-CM | POA: Insufficient documentation

## 2013-12-16 DIAGNOSIS — Z0000 Encounter for general adult medical examination without abnormal findings: Secondary | ICD-10-CM

## 2013-12-16 DIAGNOSIS — G4700 Insomnia, unspecified: Secondary | ICD-10-CM

## 2013-12-16 DIAGNOSIS — F419 Anxiety disorder, unspecified: Secondary | ICD-10-CM

## 2013-12-16 DIAGNOSIS — B070 Plantar wart: Secondary | ICD-10-CM

## 2013-12-16 DIAGNOSIS — E785 Hyperlipidemia, unspecified: Secondary | ICD-10-CM

## 2013-12-16 DIAGNOSIS — Z124 Encounter for screening for malignant neoplasm of cervix: Secondary | ICD-10-CM

## 2013-12-16 DIAGNOSIS — R000 Tachycardia, unspecified: Secondary | ICD-10-CM

## 2013-12-16 MED ORDER — SERTRALINE HCL 50 MG PO TABS: 50.0000 mg | ORAL_TABLET | Freq: Every day | ORAL | Status: DC

## 2013-12-16 MED ORDER — ZOLPIDEM TARTRATE 5 MG PO TABS: 5.0000 mg | ORAL_TABLET | Freq: Every evening | ORAL | Status: DC | PRN

## 2013-12-16 MED ORDER — ALPRAZOLAM 0.25 MG PO TABS: 0.2500 mg | ORAL_TABLET | ORAL | Status: DC | PRN

## 2013-12-16 NOTE — Progress Notes (Signed)
Pre visit review using our clinic review tool, if applicable. No additional management support is needed unless otherwise documented below in the visit note. 

## 2013-12-16 NOTE — Patient Instructions (Signed)
Insomnia Insomnia is frequent trouble falling and/or staying asleep. Insomnia can be a long term problem or a short term problem. Both are common. Insomnia can be a short term problem when the wakefulness is related to a certain stress or worry. Long term insomnia is often related to ongoing stress during waking hours and/or poor sleeping habits. Overtime, sleep deprivation itself can make the problem worse. Every little thing feels more severe because you are overtired and your ability to cope is decreased. CAUSES   Stress, anxiety, and depression.  Poor sleeping habits.  Distractions such as TV in the bedroom.  Naps close to bedtime.  Engaging in emotionally charged conversations before bed.  Technical reading before sleep.  Alcohol and other sedatives. They may make the problem worse. They can hurt normal sleep patterns and normal dream activity.  Stimulants such as caffeine for several hours prior to bedtime.  Pain syndromes and shortness of breath can cause insomnia.  Exercise late at night.  Changing time zones may cause sleeping problems (jet lag). It is sometimes helpful to have someone observe your sleeping patterns. They should look for periods of not breathing during the night (sleep apnea). They should also look to see how long those periods last. If you live alone or observers are uncertain, you can also be observed at a sleep clinic where your sleep patterns will be professionally monitored. Sleep apnea requires a checkup and treatment. Give your caregivers your medical history. Give your caregivers observations your family has made about your sleep.  SYMPTOMS   Not feeling rested in the morning.  Anxiety and restlessness at bedtime.  Difficulty falling and staying asleep. TREATMENT   Your caregiver may prescribe treatment for an underlying medical disorders. Your caregiver can give advice or help if you are using alcohol or other drugs for self-medication. Treatment  of underlying problems will usually eliminate insomnia problems.  Medications can be prescribed for short time use. They are generally not recommended for lengthy use.  Over-the-counter sleep medicines are not recommended for lengthy use. They can be habit forming.  You can promote easier sleeping by making lifestyle changes such as:  Using relaxation techniques that help with breathing and reduce muscle tension.  Exercising earlier in the day.  Changing your diet and the time of your last meal. No night time snacks.  Establish a regular time to go to bed.  Counseling can help with stressful problems and worry.  Soothing music and white noise may be helpful if there are background noises you cannot remove.  Stop tedious detailed work at least one hour before bedtime. HOME CARE INSTRUCTIONS   Keep a diary. Inform your caregiver about your progress. This includes any medication side effects. See your caregiver regularly. Take note of:  Times when you are asleep.  Times when you are awake during the night.  The quality of your sleep.  How you feel the next day. This information will help your caregiver care for you.  Get out of bed if you are still awake after 15 minutes. Read or do some quiet activity. Keep the lights down. Wait until you feel sleepy and go back to bed.  Keep regular sleeping and waking hours. Avoid naps.  Exercise regularly.  Avoid distractions at bedtime. Distractions include watching television or engaging in any intense or detailed activity like attempting to balance the household checkbook.  Develop a bedtime ritual. Keep a familiar routine of bathing, brushing your teeth, climbing into bed at the same   time each night, listening to soothing music. Routines increase the success of falling to sleep faster.  Use relaxation techniques. This can be using breathing and muscle tension release routines. It can also include visualizing peaceful scenes. You can  also help control troubling or intruding thoughts by keeping your mind occupied with boring or repetitive thoughts like the old concept of counting sheep. You can make it more creative like imagining planting one beautiful flower after another in your backyard garden.  During your day, work to eliminate stress. When this is not possible use some of the previous suggestions to help reduce the anxiety that accompanies stressful situations. MAKE SURE YOU:   Understand these instructions.  Will watch your condition.  Will get help right away if you are not doing well or get worse. Document Released: 05/04/2000 Document Revised: 07/30/2011 Document Reviewed: 06/04/2007 [employer] Patient Information 2015 [employer]. This information is not intended to replace advice given to you by your health care provider. Make sure you discuss any questions you have with your health care provider.  

## 2013-12-16 NOTE — Assessment & Plan Note (Signed)
Pap today, no concerns on exam.  

## 2013-12-17 ENCOUNTER — Encounter: Payer: BC Managed Care – PPO | Admitting: Family Medicine

## 2013-12-17 LAB — CYTOLOGY - PAP

## 2013-12-21 ENCOUNTER — Encounter: Payer: Self-pay | Admitting: Family Medicine

## 2013-12-21 DIAGNOSIS — Z0000 Encounter for general adult medical examination without abnormal findings: Secondary | ICD-10-CM | POA: Insufficient documentation

## 2013-12-21 NOTE — Assessment & Plan Note (Signed)
Had this surgically removed by podiatry

## 2013-12-21 NOTE — Progress Notes (Signed)
Patient ID: DEZIYA AMERO, female   DOB: 10/13/1975, 38 y.o.   MRN: [id number] KESHARA KIGER [id number] [birth year] 12/21/2013      Progress Note-Follow Up  Subjective  Chief Complaint  Chief Complaint  Patient presents with  . Annual Exam    physical    HPI  Patient is a 38 year old female in today for routine medical care. In for annual exam. Is doing well. Her tachycardia and anxiety are well controlled on current meds. She continues to have trouble staying asleep. No other acute concners. No recent illness. Denies CP/palp/SOB/HA/congestion/fevers/GI or GU c/o. Taking meds as prescribed  Past Medical History  Diagnosis Date  . Anxiety   . Hypertension   . TACHYCARDIA 04/21/2010  . INSOMNIA 04/21/2010  . HYPERLIPIDEMIA 04/21/2010  . ANXIETY DISORDER 04/21/2010  . Elevated BP 03/16/2012  . Plantar wart of right foot 10/07/2013  . Preventative health care 12/21/2013    Past Surgical History  Procedure Laterality Date  . Cesarean section  2007  . Leep  2005  . Dilation and curettage of uterus      incomplete miscarriage  . 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 Hypertension Father   . Arthritis Father   . Migraines Mother   . Interstitial cystitis Mother   . Ulcers Maternal Grandmother     Peptic  . Anemia Maternal Grandmother   . Heart disease Maternal Grandfather   . Heart disease Paternal Grandfather   . Alzheimer's disease Paternal Grandmother   . Other Paternal Grandmother     Possible PE    History   Social History  . Marital Status: Single    Spouse Name: N/A    Number of Children: N/A  . Years of Education: N/A   Occupational History  . Not on file.   Social History Main Topics  . Smoking status: Former Smoker    Quit date: 02/05/2009  . Smokeless tobacco: Never Used  . Alcohol Use: 0.0 oz/week    0 drink(s) per week     Comment: occasionaly/ very rare  . Drug Use: No  .  Sexual Activity: Yes    Partners: Male   Other Topics Concern  . Not on file   Social History Narrative  . No narrative on file    Current Outpatient Prescriptions on File Prior to Visit  Medication Sig Dispense Refill  . metoprolol succinate (TOPROL-XL) 25 MG 24 hr tablet Take 1 tablet (25 mg total) by mouth daily.  90 tablet  3  . norethindrone-ethinyl estradiol (CYCLAFEM,ALYACEN) 0.5/0.75/1-35 MG-MCG tablet Take 1 tablet by mouth daily.  3 Package  3   No current facility-administered medications on file prior to visit.    Allergies  Allergen Reactions  . Sulfonamide Derivatives     Review of Systems  Review of Systems  Constitutional: Negative for fever and malaise/fatigue.  HENT: Negative for congestion.   Eyes: Negative for discharge.  Respiratory: Negative for shortness of breath.   Cardiovascular: Negative for chest pain, palpitations and leg swelling.  Gastrointestinal: Negative for nausea, abdominal pain and diarrhea.  Genitourinary: Negative for dysuria.  Musculoskeletal: Negative for falls.  Skin: Negative for rash.  Neurological: Negative for loss of consciousness and headaches.  Endo/Heme/Allergies: Negative for polydipsia.  Psychiatric/Behavioral: Negative for depression and suicidal ideas. The patient is not nervous/anxious and does not have insomnia.  Objective  BP 104/64  Pulse 83  Temp(Src) 98.3 F (36.8 C) (Oral)  Ht 5' 2.75" (1.594 m)  Wt 122 lb (55.339 kg)  BMI 21.78 kg/m2  SpO2 97%  LMP 12/07/2013  Physical Exam  Physical Exam  Constitutional: She is oriented to person, place, and time and well-developed, well-nourished, and in no distress. No distress.  HENT:  Head: Normocephalic and atraumatic.  Right Ear: External ear normal.  Left Ear: External ear normal.  Nose: Nose normal.  Mouth/Throat: Oropharynx is clear and moist. No oropharyngeal exudate.  Eyes: Conjunctivae are normal. Pupils are equal, round, and reactive to light.  Right eye exhibits no discharge. Left eye exhibits no discharge. No scleral icterus.  Neck: Normal range of motion. Neck supple. No thyromegaly present.  Cardiovascular: Normal rate, regular rhythm, normal heart sounds and intact distal pulses.   No murmur heard. Pulmonary/Chest: Effort normal and breath sounds normal. No respiratory distress. She has no wheezes. She has no rales.  Abdominal: Soft. Bowel sounds are normal. She exhibits no distension and no mass. There is no tenderness.  Genitourinary: Vagina normal, uterus normal, cervix normal, right adnexa normal and left adnexa normal. No vaginal discharge found.  Musculoskeletal: Normal range of motion. She exhibits no edema and no tenderness.  Lymphadenopathy:    She has no cervical adenopathy.  Neurological: She is alert and oriented to person, place, and time. She has normal reflexes. No cranial nerve deficit. Coordination normal.  Skin: Skin is warm and dry. No rash noted. She is not diaphoretic.  Psychiatric: Mood, memory and affect normal.    Lab Results  Component Value Date   TSH 2.261 10/05/2013   Lab Results  Component Value Date   WBC 4.7 10/05/2013   HGB 13.7 10/05/2013   HCT 40.5 10/05/2013   MCV 92.0 10/05/2013   PLT 181 10/05/2013   Lab Results  Component Value Date   CREATININE 0.74 10/05/2013   BUN 11 10/05/2013   NA 137 10/05/2013   K 5.0 10/05/2013   CL 104 10/05/2013   CO2 26 10/05/2013   Lab Results  Component Value Date   ALT 13 10/05/2013   AST 16 10/05/2013   ALKPHOS 53 10/05/2013   BILITOT 0.5 10/05/2013   Lab Results  Component Value Date   CHOL 145 10/05/2013   Lab Results  Component Value Date   HDL 58 10/05/2013   Lab Results  Component Value Date   LDLCALC 69 10/05/2013   Lab Results  Component Value Date   TRIG 89 10/05/2013   Lab Results  Component Value Date   CHOLHDL 2.5 10/05/2013     Assessment & Plan  Cervical cancer screening Pap today, no concerns on exam.    HYPERLIPIDEMIA Lifestyle controlled. Encouraged heart healthy diet, increase exercise, avoid trans fats, consider a krill oil cap daily  TACHYCARDIA RRR today on Metoprolol continue the same  ANXIETY DISORDER Well controlled on Sertraline. Allowed refills due to good response  Plantar wart of right foot Had this surgically removed by podiatry  Preventative health care Patient encouraged to maintain heart healthy diet, regular exercise, adequate sleep. Consider daily probiotics. Take medications as prescribed

## 2013-12-21 NOTE — Assessment & Plan Note (Signed)
Well controlled on Sertraline. Allowed refills due to good response

## 2013-12-21 NOTE — Assessment & Plan Note (Signed)
Patient encouraged to maintain heart healthy diet, regular exercise, adequate sleep. Consider daily probiotics. Take medications as prescribed 

## 2013-12-21 NOTE — Assessment & Plan Note (Signed)
Lifestyle controlled. Encouraged heart healthy diet, increase exercise, avoid trans fats, consider a krill oil cap daily

## 2013-12-21 NOTE — Assessment & Plan Note (Signed)
RRR today on Metoprolol continue the same

## 2014-03-16 ENCOUNTER — Encounter: Payer: Self-pay | Admitting: Family Medicine

## 2014-03-16 MED ORDER — ZOLPIDEM TARTRATE 10 MG PO TABS: 10.0000 mg | ORAL_TABLET | Freq: Every evening | ORAL | Status: DC | PRN

## 2014-03-16 NOTE — Telephone Encounter (Signed)
Per md ok to send in Ambien 10 mg po qhs prn insomnia quantity 30 with 1 refill  rx sent and pt notified via mychart

## 2014-06-15 ENCOUNTER — Ambulatory Visit: Payer: BC Managed Care – PPO | Admitting: Family Medicine

## 2014-06-18 ENCOUNTER — Encounter: Payer: Self-pay | Admitting: Family Medicine

## 2014-06-18 ENCOUNTER — Ambulatory Visit (INDEPENDENT_AMBULATORY_CARE_PROVIDER_SITE_OTHER): Payer: BLUE CROSS/BLUE SHIELD | Admitting: Family Medicine

## 2014-06-18 VITALS — BP 114/75 | HR 71 | Temp 98.1°F | Ht 62.75 in | Wt 125.4 lb

## 2014-06-18 DIAGNOSIS — F419 Anxiety disorder, unspecified: Secondary | ICD-10-CM

## 2014-06-18 DIAGNOSIS — IMO0001 Reserved for inherently not codable concepts without codable children: Secondary | ICD-10-CM

## 2014-06-18 DIAGNOSIS — R000 Tachycardia, unspecified: Secondary | ICD-10-CM

## 2014-06-18 DIAGNOSIS — K439 Ventral hernia without obstruction or gangrene: Secondary | ICD-10-CM

## 2014-06-18 DIAGNOSIS — M722 Plantar fascial fibromatosis: Secondary | ICD-10-CM

## 2014-06-18 DIAGNOSIS — R030 Elevated blood-pressure reading, without diagnosis of hypertension: Secondary | ICD-10-CM

## 2014-06-18 MED ORDER — ZOLPIDEM TARTRATE 10 MG PO TABS: 10.0000 mg | ORAL_TABLET | Freq: Every evening | ORAL | Status: DC | PRN

## 2014-06-18 MED ORDER — ALPRAZOLAM 0.25 MG PO TABS: 0.2500 mg | ORAL_TABLET | ORAL | Status: DC | PRN

## 2014-06-18 NOTE — Patient Instructions (Signed)
Insomnia Insomnia is frequent trouble falling and/or staying asleep. Insomnia can be a long term problem or a short term problem. Both are common. Insomnia can be a short term problem when the wakefulness is related to a certain stress or worry. Long term insomnia is often related to ongoing stress during waking hours and/or poor sleeping habits. Overtime, sleep deprivation itself can make the problem worse. Every little thing feels more severe because you are overtired and your ability to cope is decreased. CAUSES   Stress, anxiety, and depression.  Poor sleeping habits.  Distractions such as TV in the bedroom.  Naps close to bedtime.  Engaging in emotionally charged conversations before bed.  Technical reading before sleep.  Alcohol and other sedatives. They may make the problem worse. They can hurt normal sleep patterns and normal dream activity.  Stimulants such as caffeine for several hours prior to bedtime.  Pain syndromes and shortness of breath can cause insomnia.  Exercise late at night.  Changing time zones may cause sleeping problems (jet lag). It is sometimes helpful to have someone observe your sleeping patterns. They should look for periods of not breathing during the night (sleep apnea). They should also look to see how long those periods last. If you live alone or observers are uncertain, you can also be observed at a sleep clinic where your sleep patterns will be professionally monitored. Sleep apnea requires a checkup and treatment. Give your caregivers your medical history. Give your caregivers observations your family has made about your sleep.  SYMPTOMS   Not feeling rested in the morning.  Anxiety and restlessness at bedtime.  Difficulty falling and staying asleep. TREATMENT   Your caregiver may prescribe treatment for an underlying medical disorders. Your caregiver can give advice or help if you are using alcohol or other drugs for self-medication. Treatment  of underlying problems will usually eliminate insomnia problems.  Medications can be prescribed for short time use. They are generally not recommended for lengthy use.  Over-the-counter sleep medicines are not recommended for lengthy use. They can be habit forming.  You can promote easier sleeping by making lifestyle changes such as:  Using relaxation techniques that help with breathing and reduce muscle tension.  Exercising earlier in the day.  Changing your diet and the time of your last meal. No night time snacks.  Establish a regular time to go to bed.  Counseling can help with stressful problems and worry.  Soothing music and white noise may be helpful if there are background noises you cannot remove.  Stop tedious detailed work at least one hour before bedtime. HOME CARE INSTRUCTIONS   Keep a diary. Inform your caregiver about your progress. This includes any medication side effects. See your caregiver regularly. Take note of:  Times when you are asleep.  Times when you are awake during the night.  The quality of your sleep.  How you feel the next day. This information will help your caregiver care for you.  Get out of bed if you are still awake after 15 minutes. Read or do some quiet activity. Keep the lights down. Wait until you feel sleepy and go back to bed.  Keep regular sleeping and waking hours. Avoid naps.  Exercise regularly.  Avoid distractions at bedtime. Distractions include watching television or engaging in any intense or detailed activity like attempting to balance the household checkbook.  Develop a bedtime ritual. Keep a familiar routine of bathing, brushing your teeth, climbing into bed at the same   time each night, listening to soothing music. Routines increase the success of falling to sleep faster.  Use relaxation techniques. This can be using breathing and muscle tension release routines. It can also include visualizing peaceful scenes. You can  also help control troubling or intruding thoughts by keeping your mind occupied with boring or repetitive thoughts like the old concept of counting sheep. You can make it more creative like imagining planting one beautiful flower after another in your backyard garden.  During your day, work to eliminate stress. When this is not possible use some of the previous suggestions to help reduce the anxiety that accompanies stressful situations. MAKE SURE YOU:   Understand these instructions.  Will watch your condition.  Will get help right away if you are not doing well or get worse. Document Released: 05/04/2000 Document Revised: 07/30/2011 Document Reviewed: 06/04/2007 [employer] Patient Information 2015 [employer]. This information is not intended to replace advice given to you by your health care provider. Make sure you discuss any questions you have with your health care provider.  

## 2014-06-18 NOTE — Progress Notes (Signed)
Pre visit review using our clinic review tool, if applicable. No additional management support is needed unless otherwise documented below in the visit note. 

## 2014-06-20 ENCOUNTER — Encounter: Payer: Self-pay | Admitting: Family Medicine

## 2014-06-20 DIAGNOSIS — M722 Plantar fascial fibromatosis: Secondary | ICD-10-CM | POA: Insufficient documentation

## 2014-06-20 DIAGNOSIS — K439 Ventral hernia without obstruction or gangrene: Secondary | ICD-10-CM | POA: Insufficient documentation

## 2014-06-20 HISTORY — DX: Ventral hernia without obstruction or gangrene: K43.9

## 2014-06-20 NOTE — Assessment & Plan Note (Signed)
Well controlled, no changes to meds. Encouraged heart healthy diet such as the DASH diet and exercise as tolerated.  °

## 2014-06-20 NOTE — Assessment & Plan Note (Signed)
Minimally uncomfortable and patient uninterested in surgical referral thus far. Encouraged weight loss, fiber and fluids, call if worsens for referral

## 2014-06-20 NOTE — Progress Notes (Signed)
Patient ID: Vickie Bennett, female   DOB: 1976/01/07, 39 y.o.   MRN: [id number]   Vickie Bennett  [id number] 05-29-1975 06/20/2014      Progress Note-Follow Up  Subjective  Chief Complaint  Chief Complaint  Patient presents with  . Follow-up     6 mos    HPI  Patient is a 39 y.o. female in today for routine medical care. She is in today for follow up. Mostly doing well. Continues to work. Feels she is handling her stressors well. Is having some trouble with left foot pain, plantar fasciitis. No other recent illness. Denies CP/palp/SOB/HA/congestion/fevers/GI or GU c/o. Taking meds as prescribed  Past Medical History  Diagnosis Date  . Anxiety   . Hypertension   . TACHYCARDIA 04/21/2010  . INSOMNIA 04/21/2010  . HYPERLIPIDEMIA 04/21/2010  . ANXIETY DISORDER 04/21/2010  . Elevated BP 03/16/2012  . Plantar wart of right foot 10/07/2013  . Preventative health care 12/21/2013  . HYPERLIPIDEMIA 04/21/2010    Qualifier: Diagnosis of  By: Charlett Blake MD, Erline Levine    . Hernia of anterior abdominal wall 06/20/2014  . Morbid obesity 06/20/2014    Past Surgical History  Procedure Laterality Date  . Cesarean section  2007  . Leep  2005  . Dilation and curettage of uterus      incomplete miscarriage  . 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 Hypertension Father   . Arthritis Father   . Migraines Mother   . Interstitial cystitis Mother   . Ulcers Maternal Grandmother     Peptic  . Anemia Maternal Grandmother   . Heart disease Maternal Grandfather   . Heart disease Paternal Grandfather   . Alzheimer's disease Paternal Grandmother   . Other Paternal Grandmother     Possible PE    History   Social History  . Marital Status: Single    Spouse Name: N/A    Number of Children: N/A  . Years of Education: N/A   Occupational History  . Not on file.   Social History Main Topics  . Smoking status: Former Smoker   Quit date: 02/05/2009  . Smokeless tobacco: Never Used  . Alcohol Use: 0.0 oz/week    0 drink(s) per week     Comment: occasionaly/ very rare  . Drug Use: No  . Sexual Activity:    Partners: Male   Other Topics Concern  . Not on file   Social History Narrative    Current Outpatient Prescriptions on File Prior to Visit  Medication Sig Dispense Refill  . metoprolol succinate (TOPROL-XL) 25 MG 24 hr tablet Take 1 tablet (25 mg total) by mouth daily. 90 tablet 3  . norethindrone-ethinyl estradiol (CYCLAFEM,ALYACEN) 0.5/0.75/1-35 MG-MCG tablet Take 1 tablet by mouth daily. 3 Package 3  . sertraline (ZOLOFT) 50 MG tablet Take 1 tablet (50 mg total) by mouth daily. 90 tablet 3   No current facility-administered medications on file prior to visit.    Allergies  Allergen Reactions  . Sulfonamide Derivatives     Review of Systems  Review of Systems  Constitutional: Negative for fever and malaise/fatigue.  HENT: Negative for congestion.   Eyes: Negative for discharge.  Respiratory: Negative for shortness of breath.   Cardiovascular: Negative for chest pain, palpitations and leg swelling.  Gastrointestinal: Negative for nausea, abdominal pain and diarrhea.  Genitourinary: Negative for dysuria.  Musculoskeletal:  Negative for falls.  Skin: Negative for rash.  Neurological: Negative for loss of consciousness and headaches.  Endo/Heme/Allergies: Negative for polydipsia.  Psychiatric/Behavioral: Negative for depression and suicidal ideas. The patient is not nervous/anxious and does not have insomnia.     Objective  BP 114/75 mmHg  Pulse 71  Temp(Src) 98.1 F (36.7 C) (Oral)  Ht 5' 2.75" (1.594 m)  Wt 125 lb 6.4 oz (56.881 kg)  BMI 22.39 kg/m2  SpO2 99%  LMP 05/24/2014  Physical Exam  Physical Exam  Constitutional: She is oriented to person, place, and time and well-developed, well-nourished, and in no distress. No distress.  HENT:  Head: Normocephalic and atraumatic.    Eyes: Conjunctivae are normal.  Neck: Neck supple. No thyromegaly present.  Cardiovascular: Normal rate, regular rhythm and normal heart sounds.   No murmur heard. Pulmonary/Chest: Effort normal and breath sounds normal. She has no wheezes.  Abdominal: She exhibits no distension and no mass.  Musculoskeletal: She exhibits no edema.  Lymphadenopathy:    She has no cervical adenopathy.  Neurological: She is alert and oriented to person, place, and time.  Skin: Skin is warm and dry. No rash noted. She is not diaphoretic.  Psychiatric: Memory, affect and judgment normal.    Lab Results  Component Value Date   TSH 2.261 10/05/2013   Lab Results  Component Value Date   WBC 4.7 10/05/2013   HGB 13.7 10/05/2013   HCT 40.5 10/05/2013   MCV 92.0 10/05/2013   PLT 181 10/05/2013   Lab Results  Component Value Date   CREATININE 0.74 10/05/2013   BUN 11 10/05/2013   NA 137 10/05/2013   K 5.0 10/05/2013   CL 104 10/05/2013   CO2 26 10/05/2013   Lab Results  Component Value Date   ALT 13 10/05/2013   AST 16 10/05/2013   ALKPHOS 53 10/05/2013   BILITOT 0.5 10/05/2013   Lab Results  Component Value Date   CHOL 145 10/05/2013   Lab Results  Component Value Date   HDL 58 10/05/2013   Lab Results  Component Value Date   LDLCALC 69 10/05/2013   Lab Results  Component Value Date   TRIG 89 10/05/2013   Lab Results  Component Value Date   CHOLHDL 2.5 10/05/2013     Assessment & Plan  Elevated BP Well controlled, no changes to meds. Encouraged heart healthy diet such as the DASH diet and exercise as tolerated.    Hernia of anterior abdominal wall Minimally uncomfortable and patient uninterested in surgical referral thus far. Encouraged weight loss, fiber and fluids, call if worsens for referral   Morbid obesity Encouraged DASH diet, decrease po intake and increase exercise as tolerated. Needs 7-8 hours of sleep nightly. Avoid trans fats, eat small, frequent meals  every 4-5 hours with lean proteins, complex carbs and healthy fats. Minimize simple carbs, GMO foods.   Plantar fasciitis of left foot Has been following with sports med. Encouraged good supporting foot wear, ice, stretching and salon pas gel

## 2014-06-20 NOTE — Assessment & Plan Note (Signed)
Has been following with sports med. Encouraged good supporting foot wear, ice, stretching and salon pas gel

## 2014-06-20 NOTE — Assessment & Plan Note (Signed)
Encouraged DASH diet, decrease po intake and increase exercise as tolerated. Needs 7-8 hours of sleep nightly. Avoid trans fats, eat small, frequent meals every 4-5 hours with lean proteins, complex carbs and healthy fats. Minimize simple carbs, GMO foods. 

## 2014-09-15 ENCOUNTER — Other Ambulatory Visit: Payer: Self-pay | Admitting: Family Medicine

## 2014-09-15 ENCOUNTER — Encounter: Payer: Self-pay | Admitting: Family Medicine

## 2014-09-15 MED ORDER — DESOGESTREL-ETHINYL ESTRADIOL 0.15-0.02/0.01 MG (21/5) PO TABS
1.0000 | ORAL_TABLET | Freq: Every day | ORAL | Status: DC
Start: 2014-09-15 — End: 2015-06-28

## 2014-10-04 ENCOUNTER — Other Ambulatory Visit: Payer: Self-pay | Admitting: Family Medicine

## 2014-11-30 ENCOUNTER — Other Ambulatory Visit: Payer: Self-pay | Admitting: Family Medicine

## 2014-12-07 ENCOUNTER — Telehealth: Payer: Self-pay | Admitting: Family Medicine

## 2014-12-07 NOTE — Telephone Encounter (Signed)
pre visit letter mailed 12/06/14

## 2014-12-20 ENCOUNTER — Encounter: Payer: BLUE CROSS/BLUE SHIELD | Admitting: Family Medicine

## 2014-12-24 ENCOUNTER — Ambulatory Visit (INDEPENDENT_AMBULATORY_CARE_PROVIDER_SITE_OTHER): Payer: BLUE CROSS/BLUE SHIELD | Admitting: Family Medicine

## 2014-12-24 ENCOUNTER — Encounter: Payer: Self-pay | Admitting: Family Medicine

## 2014-12-24 ENCOUNTER — Other Ambulatory Visit (HOSPITAL_COMMUNITY)
Admission: RE | Admit: 2014-12-24 | Discharge: 2014-12-24 | Disposition: A | Discharge status: Home / Self Care | Payer: BLUE CROSS/BLUE SHIELD | Source: Ambulatory Visit | Attending: Family Medicine | Admitting: Family Medicine

## 2014-12-24 VITALS — BP 104/70 | HR 91 | Temp 98.0°F | Resp 16 | Ht 63.0 in | Wt 113.0 lb

## 2014-12-24 DIAGNOSIS — F419 Anxiety disorder, unspecified: Secondary | ICD-10-CM

## 2014-12-24 DIAGNOSIS — N926 Irregular menstruation, unspecified: Secondary | ICD-10-CM | POA: Diagnosis not present

## 2014-12-24 DIAGNOSIS — Z1151 Encounter for screening for human papillomavirus (HPV): Secondary | ICD-10-CM | POA: Diagnosis present

## 2014-12-24 DIAGNOSIS — Z124 Encounter for screening for malignant neoplasm of cervix: Secondary | ICD-10-CM | POA: Diagnosis not present

## 2014-12-24 DIAGNOSIS — Z0000 Encounter for general adult medical examination without abnormal findings: Secondary | ICD-10-CM

## 2014-12-24 DIAGNOSIS — G4700 Insomnia, unspecified: Secondary | ICD-10-CM

## 2014-12-24 DIAGNOSIS — Z01419 Encounter for gynecological examination (general) (routine) without abnormal findings: Secondary | ICD-10-CM | POA: Insufficient documentation

## 2014-12-24 DIAGNOSIS — F411 Generalized anxiety disorder: Secondary | ICD-10-CM

## 2014-12-24 DIAGNOSIS — R000 Tachycardia, unspecified: Secondary | ICD-10-CM

## 2014-12-24 LAB — CBC WITH DIFFERENTIAL/PLATELET
BASOS ABS: 0 10*3/uL (ref 0.0–0.1)
Basophils Relative: 0.5 % (ref 0.0–3.0)
EOS ABS: 0.1 10*3/uL (ref 0.0–0.7)
Eosinophils Relative: 1.5 % (ref 0.0–5.0)
HCT: 44.2 % (ref 36.0–46.0)
Hemoglobin: 15 g/dL (ref 12.0–15.0)
LYMPHS ABS: 1.9 10*3/uL (ref 0.7–4.0)
LYMPHS PCT: 42 % (ref 12.0–46.0)
MCHC: 33.9 g/dL (ref 30.0–36.0)
MCV: 94 fl (ref 78.0–100.0)
Monocytes Absolute: 0.3 10*3/uL (ref 0.1–1.0)
Monocytes Relative: 5.7 % (ref 3.0–12.0)
Neutro Abs: 2.2 10*3/uL (ref 1.4–7.7)
Neutrophils Relative %: 50.3 % (ref 43.0–77.0)
PLATELETS: 172 10*3/uL (ref 150.0–400.0)
RBC: 4.7 Mil/uL (ref 3.87–5.11)
RDW: 12.9 % (ref 11.5–15.5)
WBC: 4.4 10*3/uL (ref 4.0–10.5)

## 2014-12-24 LAB — BASIC METABOLIC PANEL
BUN: 12 mg/dL (ref 6–23)
CALCIUM: 9.5 mg/dL (ref 8.4–10.5)
CO2: 27 mEq/L (ref 19–32)
Chloride: 105 mEq/L (ref 96–112)
Creatinine, Ser: 0.82 mg/dL (ref 0.40–1.20)
GFR: 83.34 mL/min (ref >60.00)
Glucose, Bld: 85 mg/dL (ref 70–99)
POTASSIUM: 4.5 meq/L (ref 3.5–5.1)
Sodium: 140 mEq/L (ref 135–145)

## 2014-12-24 LAB — TSH: TSH: 4.54 u[IU]/mL — AB (ref 0.35–4.50)

## 2014-12-24 MED ORDER — METOPROLOL SUCCINATE ER 25 MG PO TB24: ORAL_TABLET | ORAL | Status: DC

## 2014-12-24 MED ORDER — SERTRALINE HCL 50 MG PO TABS: 50.0000 mg | ORAL_TABLET | Freq: Every day | ORAL | Status: DC

## 2014-12-24 NOTE — Patient Instructions (Signed)
It was a pleasure meeting you today. We will call you with results to your labs once they become available. If you need refills for your medications, just have your pharmacy for them to [place]. If you did want to have the [facility] office as your PCP home-based, please have the front office change her PCP to me today.

## 2014-12-24 NOTE — Assessment & Plan Note (Signed)
Anxiety: Stable today. Refills on Zoloft. Patient does not need refills on Xanax at this time. Follow-up in 6 months.

## 2014-12-24 NOTE — Progress Notes (Addendum)
Subjective:    Patient ID: Vickie Bennett, female    DOB: 11-11-1975, 39 y.o.   MRN: [id number]  HPI  Well women: G2 P1011. With C-section delivery secondary to breech in 2007. Patient presents to office visit today for her yearly well woman exam. Patient has no concerns or complaints today. She will need refills on her metoprolol and Zoloft. Patient has been experiencing irregular periods, that are new for her over the last few months. She had been taking Triphasil OCP at that time. She reports having absence of periods for 3 months, and then was switched to Norterel 7/7/7 since May/June, and had a normal period last month. Patient does not desire future pregnancies. She does perform self breast exams. She denies any breast cancer history for colon cancer history in her family or self. She states she's had one abnormal Pap in 2005, at that time she had a LEEP procedure performed. She reports all Pap since then had been normal. Per EMR she has had 2 normal Paps, and to paps that had transition zone absence. Patient denies any past history of HPV, HSV or STI's. Patient is sexually active, not married.   Anxiety: Patient admits to intermittent anxiety. She has been prescribed Xanax  0.25 mg in the past. She reports she does not take this frequently. She also is prescribe Zoloft, and is needing refills today. She feels her anxiety is well controlled on these medications, and has no complaints today.  Tachycardia: Patient was diagnosed with tachycardia, has been on metoprolol 25 mg 24-hour tablet daily since 2011. She denies any chest pain, palpitations, shortness of breath, lightheadedness, fatigue or syncope.  Insomnia: Patient states she's had difficulties with sleep since she was a child. She reports "I just don't sleep ". Her and her prior PCP tried multiple changes, behavior changes, sleep hygiene and patient still was unable to sleep well without medication. Patient uses Xanax 0.25 mg at times to  help her with sleep, she is also been prescribed Ambien 10 mg which she also uses irregularly. She does not need refills on these today.  Health maintenance: Patient is up-to-date on tetanus and flu. She will need her flu shot in September or October of this year. She has not had her HIV screening. Patient performs self breast exams, no family history of breast or colon cancer.  Former Smoker  Past Medical History  Diagnosis Date  . Anxiety   . TACHYCARDIA 04/21/2010  . INSOMNIA 04/21/2010  . HYPERLIPIDEMIA 04/21/2010  . ANXIETY DISORDER 04/21/2010  . Elevated BP 03/16/2012  . Plantar wart of right foot 10/07/2013  . Preventative health care 12/21/2013  . HYPERLIPIDEMIA 04/21/2010    Qualifier: Diagnosis of  By: Charlett Blake MD, Erline Levine    . Hernia of anterior abdominal wall 06/20/2014   Past Surgical History  Procedure Laterality Date  . Cesarean section  2007  . Leep  2005  . Dilation and curettage of uterus      incomplete miscarriage  . 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 Sulfonamide Derivatives Nausea Only   Family History  Problem Relation Age of Onset  . Hypertension Father   . Arthritis Father   . Migraines Mother   . Interstitial cystitis Mother   . Ulcers Maternal Grandmother     Peptic  . Anemia Maternal Grandmother   . Heart disease Maternal Grandfather   . Heart disease  Paternal Grandfather   . Alzheimer's disease Paternal Grandmother   . Other Paternal Grandmother     Possible PE   History   Social History  . Marital Status: Single    Spouse Name: N/A  . Number of Children: N/A  . Years of Education: N/A   Occupational History  . Not on file.   Social History Main Topics  . Smoking status: Former Smoker    Quit date: 02/05/2009  . Smokeless tobacco: Never Used  . Alcohol Use: 0.0 oz/week    0 Standard drinks or equivalent per week     Comment: occasionaly/ very rare  . Drug Use: No  . Sexual  Activity:    Partners: Male   Other Topics Concern  . Not on file   Social History Narrative   Review of Systems  Constitutional: Negative.   HENT: Negative.   Eyes: Negative.   Respiratory: Negative.   Cardiovascular: Negative for chest pain, palpitations and leg swelling.  Gastrointestinal: Negative.   Endocrine: Negative.   Genitourinary: Positive for menstrual problem. Negative for vaginal discharge, vaginal pain and pelvic pain.  Musculoskeletal: Positive for arthralgias.  Skin: Negative.   Allergic/Immunologic: Negative.   Neurological: Negative for dizziness, syncope, weakness, light-headedness, numbness and headaches.  Psychiatric/Behavioral: Positive for sleep disturbance. Negative for suicidal ideas and self-injury. The patient is nervous/anxious.       Objective:   Physical Exam BP 104/70 mmHg  Pulse 91  Temp(Src) 98 F (36.7 C) (Oral)  Resp 16  Ht 5\' 3"  (1.6 m)  Wt 113 lb (51.256 kg)  BMI 20.02 kg/m2  SpO2 97%  LMP 12/10/2014 Gen: NAD. Nontoxic in appearance, well-developed, well-nourished, physically fit, Caucasian female. Very pleasant. HEENT: AT. [place]. Bilateral TM visualized and normal in appearance. Bilateral eyes without injections, drainage or icterus. MMM. Bilateral nares without erythema or swelling. Throat without erythema or exudates.  Neck: Supple, no lymphadenopathy, no thyromegaly, trachea midline. CV: RRR, no murmurs present Chest: CTAB, no wheeze or crackles Abd: Soft. Flat . NTND. BS present. No Masses palpated. No hepatosplenomegaly Ext: No erythema. No edema. +2/4 PT bilaterally. Skin: No rashes, purpura or petechiae.  Neuro: Normal gait. PERLA. EOMi. Alert. Oriented. Cranial nerves II through XII intact, no focal deficits. Muscle strength 5/5 upper and lower extremity. DTRs equal bilaterally upper and lower extremity. Psych: Normal affect, dress and demeanor. Normal speech. Normal thought content. Normal judgment. GYN:  External genitalia  within normal limits.  Vaginal mucosa pink, moist, normal rugae.  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 no discharge or bleeding noted on speculum exam.  Bimanual exam revealed normal, nongravid uterus.  No cervical motion tenderness. No adnexal masses bilaterally.        Assessment & Plan:  Vickie Bennett is 39 y.o. female presents for preventive yearly physical. - Well woman/irregular menses: Patient recently had irregular menses starting at the beginning of 2016 where she missed 3 menstrual cycles. Her birth control pills were changed, and she did have her first regular menses last month. Pap with HPV was collected today, although specimen collection was difficult secondary to stenotic cervix likely from her prior LEEP cone. Discussed this with patient that she has had at least 2 Paps with the transformation zone absent, and if this would occur again had repeat her Pap in 6 months. TSH collected today for irregular menses.  - Health maintenance: Patient is up-to-date with her tetanus, no indication for early breast or colon cancer screenings. Start breast  screening at 19 or 39 years old, and colon cancer screening at 25. Will be offered HIV screening at her 6 month follow-up.  - Insomnia: Patient has suffered from insomnia for multiple years. She is stable on her medications today. And does not require any refills on her Xanax or Ambien. Discussed the possibility of trying Klonopin to replace both Xanax and Ambien in the future, as this may help her with both her insomnia and anxiety. Follow-up in 6 months.  - Anxiety: Stable today. Refills on Zoloft. Patient does not need refills on Xanax at this time. Follow-up in 6 months.  - Tachycardia: Patient's heart rate today is normal, she denies any symptoms. Refills on metoprolol completed today. Follow-up in 6 months, or sooner if becomes symptomatic. BMP collected today. Lipids not indicated, I do not see evidence of  her having high cholesterol in the past, she has never been obese or suffered from hypertension. She was a former smoker, and has a mild family history of heart disease, would recheck lipids every 3 years. Past lipids have always been normal, last collected 2015.

## 2014-12-24 NOTE — Assessment & Plan Note (Signed)
-   Health maintenance: Patient is up-to-date with her tetanus, no indication for early breast or colon cancer screenings. Start breast screening at 68 or 39 years old, and colon cancer screening at 46. Will be offered HIV screening at her 6 month follow-up.

## 2014-12-24 NOTE — Assessment & Plan Note (Signed)
Insomnia: Patient has suffered from insomnia for multiple years. She is stable on her medications today. And does not require any refills on her Xanax or Ambien. Discussed the possibility of trying Klonopin to replace both Xanax and Ambien in the future, as this may help her with both her insomnia and anxiety. Follow-up in 6 months.

## 2014-12-24 NOTE — Assessment & Plan Note (Signed)
Tachycardia: Patient's heart rate today is normal, she denies any symptoms. Refills on metoprolol completed today. Follow-up in 6 months, or sooner if becomes symptomatic. BMP collected today. Lipids not indicated, I do not see evidence of her having high cholesterol in the past, she has never been obese or suffered from hypertension. She was a former smoker, and has a mild family history of heart disease, would recheck lipids every 3 years. Past lipids have always been normal, last collected 2015.

## 2014-12-24 NOTE — Progress Notes (Signed)
Pre visit review using our clinic review tool, if applicable. No additional management support is needed unless otherwise documented below in the visit note. 

## 2014-12-24 NOTE — Assessment & Plan Note (Signed)
-   Well woman/irregular menses: Patient recently had irregular menses starting at the beginning of 2016 where she missed 3 menstrual cycles. Her birth control pills were changed, and she did have her first regular menses last month. Pap with HPV was collected today, although specimen collection was difficult secondary to stenotic cervix likely from her prior LEEP cone. Discussed this with patient that she has had at least 2 Paps with the transformation zone absent, and if this would occur again had repeat her Pap in 6 months. TSH collected today for irregular menses.

## 2014-12-27 ENCOUNTER — Encounter: Payer: BLUE CROSS/BLUE SHIELD | Admitting: Family Medicine

## 2014-12-27 LAB — CYTOLOGY - PAP

## 2014-12-29 ENCOUNTER — Telehealth: Payer: Self-pay | Admitting: Family Medicine

## 2014-12-29 DIAGNOSIS — R7989 Other specified abnormal findings of blood chemistry: Secondary | ICD-10-CM | POA: Insufficient documentation

## 2014-12-29 NOTE — Telephone Encounter (Signed)
Discussed lab work with patient today. Discuss very mildly abnormal TSH, future lab order will be placed so that patient can have repeat labs and of October/November. Discussed Pap smear with absent transition zone, would recommend follow-up Pap in 6 months to a year. Patient had a stenotic cervix, likely secondary to her LEEP procedure. Patient understands discussion today, was allowed the opportunity to ask questions. All questions were answered.

## 2015-01-14 ENCOUNTER — Other Ambulatory Visit: Payer: Self-pay | Admitting: Family Medicine

## 2015-01-24 ENCOUNTER — Other Ambulatory Visit: Payer: Self-pay | Admitting: Family Medicine

## 2015-01-24 NOTE — Telephone Encounter (Signed)
I cannot refill these meds since I havve not seen her for 6 months and she is transferring her care to [facility]

## 2015-01-31 ENCOUNTER — Encounter: Payer: Self-pay | Admitting: Family Medicine

## 2015-01-31 ENCOUNTER — Other Ambulatory Visit: Payer: Self-pay | Admitting: *Deleted

## 2015-01-31 ENCOUNTER — Other Ambulatory Visit: Payer: Self-pay | Admitting: Family Medicine

## 2015-01-31 DIAGNOSIS — F419 Anxiety disorder, unspecified: Secondary | ICD-10-CM

## 2015-01-31 DIAGNOSIS — R000 Tachycardia, unspecified: Secondary | ICD-10-CM

## 2015-01-31 MED ORDER — ZOLPIDEM TARTRATE 10 MG PO TABS: 10.0000 mg | ORAL_TABLET | Freq: Every evening | ORAL | Status: DC | PRN

## 2015-01-31 MED ORDER — NORETHIN-ETH ESTRAD TRIPHASIC 0.5/0.75/1-35 MG-MCG PO TABS: 1.0000 | ORAL_TABLET | Freq: Every day | ORAL | Status: DC

## 2015-01-31 MED ORDER — ALPRAZOLAM 0.25 MG PO TABS: 0.2500 mg | ORAL_TABLET | ORAL | Status: DC | PRN

## 2015-01-31 NOTE — Telephone Encounter (Signed)
Pt sent message via mychart requesting refills.  LOV: 12/24/14 NOV: 06/27/15  RF request for birth control Last written: 10/04/14 #84 w/ 3RF  RF request for xanax Last written: 06/18/14 #40 w/ 2RF  RF request for ambien Last written: 06/18/14 #30 w/ 4RF  Please advise. Thanks.

## 2015-01-31 NOTE — Telephone Encounter (Signed)
Opened in error

## 2015-03-02 ENCOUNTER — Other Ambulatory Visit (INDEPENDENT_AMBULATORY_CARE_PROVIDER_SITE_OTHER): Payer: BLUE CROSS/BLUE SHIELD

## 2015-03-02 ENCOUNTER — Telehealth: Payer: Self-pay | Admitting: Family Medicine

## 2015-03-02 DIAGNOSIS — R946 Abnormal results of thyroid function studies: Secondary | ICD-10-CM | POA: Diagnosis not present

## 2015-03-02 DIAGNOSIS — Z23 Encounter for immunization: Secondary | ICD-10-CM | POA: Diagnosis not present

## 2015-03-02 DIAGNOSIS — R7989 Other specified abnormal findings of blood chemistry: Secondary | ICD-10-CM

## 2015-03-02 LAB — TSH: TSH: 3.39 u[IU]/mL (ref 0.35–4.50)

## 2015-03-02 LAB — T4, FREE: FREE T4: 0.77 ng/dL (ref 0.60–1.60)

## 2015-03-02 NOTE — Telephone Encounter (Signed)
Please call patient, her thyroid studies are normal today.

## 2015-03-03 NOTE — Telephone Encounter (Signed)
Left detailed message on pt's cell.  Okay per DPR. 

## 2015-03-23 ENCOUNTER — Encounter: Payer: Self-pay | Admitting: Family Medicine

## 2015-03-28 ENCOUNTER — Other Ambulatory Visit: Payer: Self-pay | Admitting: Family Medicine

## 2015-04-11 ENCOUNTER — Telehealth: Payer: Self-pay | Admitting: Family Medicine

## 2015-04-11 ENCOUNTER — Encounter: Payer: Self-pay | Admitting: Family Medicine

## 2015-04-11 MED ORDER — ZOLPIDEM TARTRATE 10 MG PO TABS: 10.0000 mg | ORAL_TABLET | Freq: Every evening | ORAL | Status: DC | PRN

## 2015-04-11 NOTE — Telephone Encounter (Signed)
Pt state she has been requesting her [place] script from pharmacy. I have not received a request. Prescription printed, please faxed to her pharmacy. Please make patient aware this was faxed to her pharmacy.

## 2015-04-11 NOTE — Telephone Encounter (Signed)
Spoke with patient let her know we faxed Rx to her pharmacy.

## 2015-06-27 ENCOUNTER — Ambulatory Visit: Payer: BLUE CROSS/BLUE SHIELD | Admitting: Family Medicine

## 2015-06-27 ENCOUNTER — Encounter: Payer: BLUE CROSS/BLUE SHIELD | Admitting: Family Medicine

## 2015-06-28 ENCOUNTER — Ambulatory Visit (INDEPENDENT_AMBULATORY_CARE_PROVIDER_SITE_OTHER): Payer: BLUE CROSS/BLUE SHIELD | Admitting: Family Medicine

## 2015-06-28 ENCOUNTER — Encounter: Payer: Self-pay | Admitting: Family Medicine

## 2015-06-28 VITALS — BP 116/82 | HR 81 | Temp 98.9°F | Resp 20 | Wt 125.5 lb

## 2015-06-28 DIAGNOSIS — F419 Anxiety disorder, unspecified: Secondary | ICD-10-CM | POA: Diagnosis not present

## 2015-06-28 DIAGNOSIS — F411 Generalized anxiety disorder: Secondary | ICD-10-CM

## 2015-06-28 DIAGNOSIS — R000 Tachycardia, unspecified: Secondary | ICD-10-CM

## 2015-06-28 DIAGNOSIS — G4700 Insomnia, unspecified: Secondary | ICD-10-CM | POA: Diagnosis not present

## 2015-06-28 DIAGNOSIS — Z124 Encounter for screening for malignant neoplasm of cervix: Secondary | ICD-10-CM

## 2015-06-28 MED ORDER — METOPROLOL SUCCINATE ER 25 MG PO TB24: ORAL_TABLET | ORAL | Status: DC

## 2015-06-28 MED ORDER — SERTRALINE HCL 50 MG PO TABS: 50.0000 mg | ORAL_TABLET | Freq: Every day | ORAL | Status: DC

## 2015-06-28 MED ORDER — ALPRAZOLAM 0.25 MG PO TABS: 0.2500 mg | ORAL_TABLET | Freq: Every evening | ORAL | Status: DC | PRN

## 2015-06-28 MED ORDER — ZOLPIDEM TARTRATE 10 MG PO TABS: 10.0000 mg | ORAL_TABLET | Freq: Every evening | ORAL | Status: DC | PRN

## 2015-06-28 MED ORDER — DESOGESTREL-ETHINYL ESTRADIOL 0.15-0.02/0.01 MG (21/5) PO TABS: 1.0000 | ORAL_TABLET | Freq: Every day | ORAL | Status: DC

## 2015-06-28 NOTE — Patient Instructions (Signed)
It was a pleasure seeing you today.  F.U 6 months on chronic issues/CPE (after August 6 th)

## 2015-06-28 NOTE — Progress Notes (Signed)
Patient ID: Vickie Bennett, female   DOB: [birth year], 40 y.o.   MRN: [id number]   Subjective:    Patient ID: Vickie Bennett, female    DOB: [birth year], 40 y.o.   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 Discussed with patient that she has had a insufficient sample for Pap smear the last 3 collections, by multiple different providers. During her history of LEEP cone in the past, we discussed today the referral to gynecology for further evaluation. Patient is amendable and would like a female provider in [facility] area. Discussion on irregular menses, she states they are more regular, but she is skipping and cycles. She realized that she requested refill on the wrong birth control pill. Per EMR review, she did ask for refills on the wrong birth control. The refill request was for the birth control method she was on prior to [place]. TSH was obtain on last office visit secondary to regular menses, and it was normal.  Anxiety: Patient admits to intermittent anxiety. She has been prescribed Xanax  0.25 mg in the past. She reports she does not take this frequently. She also is prescribe Zoloft. She feels her anxiety is well controlled on these medications, and has no complaints today. She is needing refills on both medications today.  Tachycardia: Patient was diagnosed with tachycardia, has been on metoprolol 25 mg 24-hour tablet daily since 2011. She denies any chest pain, palpitations, shortness of breath, lightheadedness, fatigue or syncope. She is needing refills on this medication today.  Insomnia: Patient states she's had difficulties with sleep since she was a child. She reports "I just don't sleep ". Her and her prior PCP tried multiple changes, behavior changes, sleep hygiene and patient still was unable to sleep well without medication. Patient uses Xanax 0.25 mg at times to help her with  sleep, she is also been prescribed Ambien 10 mg which she also uses irregularly. She needs refills on both of these medications today.  Health maintenance: Patient is up-to-date on tetanus and flu. He has had her HIV screening. Patient performs self breast exams, no family history of breast or colon cancer.   Past Medical History  Diagnosis Date  . Anxiety   . TACHYCARDIA 04/21/2010  . INSOMNIA 04/21/2010  . HYPERLIPIDEMIA 04/21/2010  . ANXIETY DISORDER 04/21/2010  . Elevated BP 03/16/2012  . Plantar wart of right foot 10/07/2013  . Preventative health care 12/21/2013  . HYPERLIPIDEMIA 04/21/2010    Qualifier: Diagnosis of  By: Charlett Blake MD, Erline Levine    . Hernia of anterior abdominal wall 06/20/2014   Past Surgical History  Procedure Laterality Date  . Cesarean section  2007  . Leep  2005  . Dilation and curettage of uterus      incomplete miscarriage  . 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 Sulfonamide Derivatives Nausea Only   Family History  Problem Relation Age of Onset  . Hypertension Father   . Arthritis Father   . Migraines Mother   . Interstitial cystitis Mother   . Ulcers Maternal Grandmother     Peptic  . Anemia Maternal Grandmother   . Heart disease Maternal Grandfather   . Heart disease Paternal Grandfather   . Alzheimer's disease Paternal Grandmother   . Other Paternal Grandmother     Possible  PE   Social History   Social History  . Marital Status: Single    Spouse Name: N/A  . Number of Children: N/A  . Years of Education: N/A   Occupational History  . Not on file.   Social History Main Topics  . Smoking status: Former Smoker    Quit date: 02/05/2009  . Smokeless tobacco: Never Used  . Alcohol Use: 0.0 oz/week    0 Standard drinks or equivalent per week     Comment: occasionaly/ very rare  . Drug Use: No  . Sexual Activity:    Partners: Male   Other Topics Concern  . Not on file   Social  History Narrative   Review of Systems  Constitutional: Negative.   HENT: Negative.   Eyes: Negative.   Respiratory: Negative.   Cardiovascular: Negative for leg swelling.  Gastrointestinal: Negative.   Endocrine: Negative.   Genitourinary: Positive for menstrual problem. Negative for vaginal discharge, vaginal pain and pelvic pain.  Musculoskeletal: Positive for arthralgias.  Skin: Negative.   Allergic/Immunologic: Negative.   Neurological: Negative for syncope, weakness, light-headedness, numbness and headaches.  Psychiatric/Behavioral: Positive for sleep disturbance. Negative for self-injury.      Objective:   Physical Exam BP 116/82 mmHg  Pulse 81  Temp(Src) 98.9 F (37.2 C)  Resp 20  Wt 125 lb 8 oz (56.926 kg)  SpO2 99%  LMP 06/20/2015 (Approximate) Gen: NAD. Nontoxic in appearance, well-developed, well-nourished, physically fit, Caucasian female. Very pleasant. HEENT: AT. [place]. MMM.  Neck: Supple, no lymphadenopathy, no thyromegaly. CV: RRR, no murmurs present Chest: CTAB, no wheeze or crackles Abd: Soft. Flat . NTND. BS present. No Masses palpated. No hepatosplenomegaly Ext: No erythema. No edema. +2/4 PT bilaterally. Neuro: Normal gait. PERLA. EOMi. Alert. Oriented.  Psych: Normal affect, dress and demeanor. Normal speech. Normal thought content. Normal judgment.     Assessment & Plan:  Vickie Bennett is 40 y.o. female presents for follow-up on insomnia, anxiety, tachycardia and discussion on Pap results, and irregular menses. - irregular menses/PAP:  - Patient has history of LEEP cone. Patient has been unable to have a satisfactory Pap smear of the last 3 collections, by multiple providers secondary to stenotic cervix. Discussion with patient today, referred to gynecology to continue monitoring.  -She prefers female provider, in [facility] area if possible. - Refill on birth control pills. Patient believes her irregular menses or secondary to requesting the wrong  birth control pill for refill. Per EMR review, she was prescribed a different pill last year. This was corrected in the electronic medical record system and the birth control pill that was desired has been refilled today.  - Insomnia: Patient has suffered from insomnia for multiple years. She is stable on her medications today.  - Refills on Xanax and Ambien prescribed today. - Follow-up in 6 months  - Anxiety:  - Stable today.  - Refills on Zoloft. - Follow-up in 6 months  - Tachycardia:  - Stable -  Refills on metoprolol completed today.  - Follow-up in 6 months, or sooner if becomes symptomatic.

## 2015-11-07 ENCOUNTER — Encounter: Payer: Self-pay | Admitting: Family Medicine

## 2015-11-08 ENCOUNTER — Other Ambulatory Visit: Payer: Self-pay | Admitting: Family Medicine

## 2015-11-08 MED ORDER — ZOLPIDEM TARTRATE 10 MG PO TABS: 10.0000 mg | ORAL_TABLET | Freq: Every evening | ORAL | Status: DC | PRN

## 2015-12-05 ENCOUNTER — Other Ambulatory Visit: Payer: Self-pay | Admitting: *Deleted

## 2015-12-05 NOTE — Telephone Encounter (Signed)
Pharmacy request received for patient birth control pills. Left message for patient to return call to verify correct medication.

## 2015-12-26 ENCOUNTER — Ambulatory Visit (INDEPENDENT_AMBULATORY_CARE_PROVIDER_SITE_OTHER): Payer: BLUE CROSS/BLUE SHIELD | Admitting: Family Medicine

## 2015-12-26 ENCOUNTER — Encounter: Payer: Self-pay | Admitting: Family Medicine

## 2015-12-26 ENCOUNTER — Telehealth: Payer: Self-pay | Admitting: Family Medicine

## 2015-12-26 VITALS — BP 109/75 | HR 80 | Temp 98.6°F | Resp 20 | Ht 63.0 in | Wt 124.5 lb

## 2015-12-26 DIAGNOSIS — Z13220 Encounter for screening for lipoid disorders: Secondary | ICD-10-CM

## 2015-12-26 DIAGNOSIS — Z111 Encounter for screening for respiratory tuberculosis: Secondary | ICD-10-CM

## 2015-12-26 DIAGNOSIS — Z1329 Encounter for screening for other suspected endocrine disorder: Secondary | ICD-10-CM | POA: Diagnosis not present

## 2015-12-26 DIAGNOSIS — Z130 Encounter for screening for diseases of the blood and blood-forming organs and certain disorders involving the immune mechanism: Secondary | ICD-10-CM

## 2015-12-26 DIAGNOSIS — F411 Generalized anxiety disorder: Secondary | ICD-10-CM

## 2015-12-26 DIAGNOSIS — Z0000 Encounter for general adult medical examination without abnormal findings: Secondary | ICD-10-CM

## 2015-12-26 LAB — COMPREHENSIVE METABOLIC PANEL
ALT: 11 U/L (ref 0–35)
AST: 13 U/L (ref 0–37)
Albumin: 4.1 g/dL (ref 3.5–5.2)
Alkaline Phosphatase: 54 U/L (ref 39–117)
BUN: 10 mg/dL (ref 6–23)
CHLORIDE: 106 meq/L (ref 96–112)
CO2: 26 mEq/L (ref 19–32)
Calcium: 9.3 mg/dL (ref 8.4–10.5)
Creatinine, Ser: 0.75 mg/dL (ref 0.40–1.20)
GFR: 91.88 mL/min (ref >60.00)
GLUCOSE: 90 mg/dL (ref 70–99)
POTASSIUM: 4.2 meq/L (ref 3.5–5.1)
SODIUM: 140 meq/L (ref 135–145)
Total Bilirubin: 0.6 mg/dL (ref 0.2–1.2)
Total Protein: 6.6 g/dL (ref 6.0–8.3)

## 2015-12-26 LAB — CBC WITH DIFFERENTIAL/PLATELET
BASOS PCT: 0.6 % (ref 0.0–3.0)
Basophils Absolute: 0 10*3/uL (ref 0.0–0.1)
EOS PCT: 3.3 % (ref 0.0–5.0)
Eosinophils Absolute: 0.2 10*3/uL (ref 0.0–0.7)
HCT: 42.7 % (ref 36.0–46.0)
Hemoglobin: 14.3 g/dL (ref 12.0–15.0)
LYMPHS ABS: 2.1 10*3/uL (ref 0.7–4.0)
Lymphocytes Relative: 46.1 % — ABNORMAL HIGH (ref 12.0–46.0)
MCHC: 33.5 g/dL (ref 30.0–36.0)
MCV: 93.6 fl (ref 78.0–100.0)
MONO ABS: 0.3 10*3/uL (ref 0.1–1.0)
Monocytes Relative: 7.3 % (ref 3.0–12.0)
NEUTROS ABS: 2 10*3/uL (ref 1.4–7.7)
NEUTROS PCT: 42.7 % — AB (ref 43.0–77.0)
Platelets: 169 10*3/uL (ref 150.0–400.0)
RBC: 4.56 Mil/uL (ref 3.87–5.11)
RDW: 13.6 % (ref 11.5–15.5)
WBC: 4.6 10*3/uL (ref 4.0–10.5)

## 2015-12-26 LAB — LIPID PANEL
CHOL/HDL RATIO: 2
Cholesterol: 165 mg/dL (ref 0–200)
HDL: 69.7 mg/dL (ref >39.00)
LDL Cholesterol: 73 mg/dL (ref 0–99)
NONHDL: 94.96
TRIGLYCERIDES: 112 mg/dL (ref 0.0–149.0)
VLDL: 22.4 mg/dL (ref 0.0–40.0)

## 2015-12-26 LAB — TSH: TSH: 6.8 u[IU]/mL — AB (ref 0.35–4.50)

## 2015-12-26 MED ORDER — ESCITALOPRAM OXALATE 10 MG PO TABS: 10.0000 mg | ORAL_TABLET | Freq: Every day | ORAL | 1 refills | Status: DC

## 2015-12-26 NOTE — Patient Instructions (Signed)
Health Maintenance, Female Adopting a healthy lifestyle and getting preventive care can go a long way to promote health and wellness. Talk with your health care provider about what schedule of regular examinations is right for you. This is a good chance for you to check in with your provider about disease prevention and staying healthy. In between checkups, there are plenty of things you can do on your own. Experts have done a lot of research about which lifestyle changes and preventive measures are most likely to keep you healthy. Ask your health care provider for more information. WEIGHT AND DIET  Eat a healthy diet  Be sure to include plenty of vegetables, fruits, low-fat dairy products, and lean protein.  Do not eat a lot of foods high in solid fats, added sugars, or salt.  Get regular exercise. This is one of the most important things you can do for your health.  Most adults should exercise for at least 150 minutes each week. The exercise should increase your heart rate and make you sweat (moderate-intensity exercise).  Most adults should also do strengthening exercises at least twice a week. This is in addition to the moderate-intensity exercise.  Maintain a healthy weight  Body mass index (BMI) is a measurement that can be used to identify possible weight problems. It estimates body fat based on height and weight. Your health care provider can help determine your BMI and help you achieve or maintain a healthy weight.  For females 20 years of age and older:   A BMI below 18.5 is considered underweight.  A BMI of 18.5 to 24.9 is normal.  A BMI of 25 to 29.9 is considered overweight.  A BMI of 30 and above is considered obese.  Watch levels of cholesterol and blood lipids  You should start having your blood tested for lipids and cholesterol at 38 years of age, then have this test every 5 years.  You may need to have your cholesterol levels checked more often if:  Your lipid  or cholesterol levels are high.  You are older than 38 years of age.  You are at high risk for heart disease.  CANCER SCREENING   Lung Cancer  Lung cancer screening is recommended for adults 55-80 years old who are at high risk for lung cancer because of a history of smoking.  A yearly low-dose CT scan of the lungs is recommended for people who:  Currently smoke.  Have quit within the past 15 years.  Have at least a 30-pack-year history of smoking. A pack year is smoking an average of one pack of cigarettes a day for 1 year.  Yearly screening should continue until it has been 15 years since you quit.  Yearly screening should stop if you develop a health problem that would prevent you from having lung cancer treatment.  Breast Cancer  Practice breast self-awareness. This means understanding how your breasts normally appear and feel.  It also means doing regular breast self-exams. Let your health care provider know about any changes, no matter how small.  If you are in your 20s or 30s, you should have a clinical breast exam (CBE) by a health care provider every 1-3 years as part of a regular health exam.  If you are 40 or older, have a CBE every year. Also consider having a breast X-ray (mammogram) every year.  If you have a family history of breast cancer, talk to your health care provider about genetic screening.  If you   are at high risk for breast cancer, talk to your health care provider about having an MRI and a mammogram every year.  Breast cancer gene (BRCA) assessment is recommended for women who have family members with BRCA-related cancers. BRCA-related cancers include:  Breast.  Ovarian.  Tubal.  Peritoneal cancers.  Results of the assessment will determine the need for genetic counseling and BRCA1 and BRCA2 testing. Cervical Cancer Your health care provider may recommend that you be screened regularly for cancer of the pelvic organs (ovaries, uterus, and  vagina). This screening involves a pelvic examination, including checking for microscopic changes to the surface of your cervix (Pap test). You may be encouraged to have this screening done every 3 years, beginning at age 21.  For women ages 30-65, health care providers may recommend pelvic exams and Pap testing every 3 years, or they may recommend the Pap and pelvic exam, combined with testing for human papilloma virus (HPV), every 5 years. Some types of HPV increase your risk of cervical cancer. Testing for HPV may also be done on women of any age with unclear Pap test results.  Other health care providers may not recommend any screening for nonpregnant women who are considered low risk for pelvic cancer and who do not have symptoms. Ask your health care provider if a screening pelvic exam is right for you.  If you have had past treatment for cervical cancer or a condition that could lead to cancer, you need Pap tests and screening for cancer for at least 20 years after your treatment. If Pap tests have been discontinued, your risk factors (such as having a new sexual partner) need to be reassessed to determine if screening should resume. Some women have medical problems that increase the chance of getting cervical cancer. In these cases, your health care provider may recommend more frequent screening and Pap tests. Colorectal Cancer  This type of cancer can be detected and often prevented.  Routine colorectal cancer screening usually begins at 38 years of age and continues through 38 years of age.  Your health care provider may recommend screening at an earlier age if you have risk factors for colon cancer.  Your health care provider may also recommend using home test kits to check for hidden blood in the stool.  A small camera at the end of a tube can be used to examine your colon directly (sigmoidoscopy or colonoscopy). This is done to check for the earliest forms of colorectal  cancer.  Routine screening usually begins at age 50.  Direct examination of the colon should be repeated every 5-10 years through 38 years of age. However, you may need to be screened more often if early forms of precancerous polyps or small growths are found. Skin Cancer  Check your skin from head to toe regularly.  Tell your health care provider about any new moles or changes in moles, especially if there is a change in a mole's shape or color.  Also tell your health care provider if you have a mole that is larger than the size of a pencil eraser.  Always use sunscreen. Apply sunscreen liberally and repeatedly throughout the day.  Protect yourself by wearing long sleeves, pants, a wide-brimmed hat, and sunglasses whenever you are outside. HEART DISEASE, DIABETES, AND HIGH BLOOD PRESSURE   High blood pressure causes heart disease and increases the risk of stroke. High blood pressure is more likely to develop in:  People who have blood pressure in the high end   of the normal range (130-139/85-89 mm Hg).  People who are overweight or obese.  People who are African American.  If you are 38-23 years of age, have your blood pressure checked every 3-5 years. If you are 61 years of age or older, have your blood pressure checked every year. You should have your blood pressure measured twice--once when you are at a hospital or clinic, and once when you are not at a hospital or clinic. Record the average of the two measurements. To check your blood pressure when you are not at a hospital or clinic, you can use:  An automated blood pressure machine at a pharmacy.  A home blood pressure monitor.  If you are between 45 years and 39 years old, ask your health care provider if you should take aspirin to prevent strokes.  Have regular diabetes screenings. This involves taking a blood sample to check your fasting blood sugar level.  If you are at a normal weight and have a low risk for diabetes,  have this test once every three years after 38 years of age.  If you are overweight and have a high risk for diabetes, consider being tested at a younger age or more often. PREVENTING INFECTION  Hepatitis B  If you have a higher risk for hepatitis B, you should be screened for this virus. You are considered at high risk for hepatitis B if:  You were born in a country where hepatitis B is common. Ask your health care provider which countries are considered high risk.  Your parents were born in a high-risk country, and you have not been immunized against hepatitis B (hepatitis B vaccine).  You have HIV or AIDS.  You use needles to inject street drugs.  You live with someone who has hepatitis B.  You have had sex with someone who has hepatitis B.  You get hemodialysis treatment.  You take certain medicines for conditions, including cancer, organ transplantation, and autoimmune conditions. Hepatitis C  Blood testing is recommended for:  Everyone born from [birth year] through 1963.  Anyone with known risk factors for hepatitis C. Sexually transmitted infections (STIs)  You should be screened for sexually transmitted infections (STIs) including gonorrhea and chlamydia if:  You are sexually active and are younger than 38 years of age.  You are older than 38 years of age and your health care provider tells you that you are at risk for this type of infection.  Your sexual activity has changed since you were last screened and you are at an increased risk for chlamydia or gonorrhea. Ask your health care provider if you are at risk.  If you do not have HIV, but are at risk, it may be recommended that you take a prescription medicine daily to prevent HIV infection. This is called pre-exposure prophylaxis (PrEP). You are considered at risk if:  You are sexually active and do not regularly use condoms or know the HIV status of your partner(s).  You take drugs by injection.  You are sexually  active with a partner who has HIV. Talk with your health care provider about whether you are at high risk of being infected with HIV. If you choose to begin PrEP, you should first be tested for HIV. You should then be tested every 3 months for as long as you are taking PrEP.  PREGNANCY   If you are premenopausal and you may become pregnant, ask your health care provider about preconception counseling.  If you may  become pregnant, take 400 to 800 micrograms (mcg) of folic acid every day.  If you want to prevent pregnancy, talk to your health care provider about birth control (contraception). OSTEOPOROSIS AND MENOPAUSE   Osteoporosis is a disease in which the bones lose minerals and strength with aging. This can result in serious bone fractures. Your risk for osteoporosis can be identified using a bone density scan.  If you are 61 years of age or older, or if you are at risk for osteoporosis and fractures, ask your health care provider if you should be screened.  Ask your health care provider whether you should take a calcium or vitamin D supplement to lower your risk for osteoporosis.  Menopause may have certain physical symptoms and risks.  Hormone replacement therapy may reduce some of these symptoms and risks. Talk to your health care provider about whether hormone replacement therapy is right for you.  HOME CARE INSTRUCTIONS   Schedule regular health, dental, and eye exams.  Stay current with your immunizations.   Do not use any tobacco products including cigarettes, chewing tobacco, or electronic cigarettes.  If you are pregnant, do not drink alcohol.  If you are breastfeeding, limit how much and how often you drink alcohol.  Limit alcohol intake to no more than 1 drink per day for nonpregnant women. One drink equals 12 ounces of beer, 5 ounces of wine, or 1 ounces of hard liquor.  Do not use street drugs.  Do not share needles.  Ask your health care provider for help if  you need support or information about quitting drugs.  Tell your health care provider if you often feel depressed.  Tell your health care provider if you have ever been abused or do not feel safe at home.   This information is not intended to replace advice given to you by your health care provider. Make sure you discuss any questions you have with your health care provider.   Document Released: 11/20/2010 Document Revised: 05/28/2014 Document Reviewed: 04/08/2013 Elsevier Interactive Patient Education [employer].

## 2015-12-26 NOTE — Telephone Encounter (Signed)
Please call pt: - her tsh is mildly elevated- this happened last year as well and then resolved.  - will retest when she returns in 4 weeks for medication recheck on anxiety.

## 2015-12-26 NOTE — Progress Notes (Signed)
Patient ID: Vickie Bennett, female  DOB: Aug 13, 1975, 40 y.o.   MRN: [id number] Patient Care Team    Relationship Specialty Notifications Start End  Ma Hillock, DO PCP - General Family Medicine  12/24/14   Oliver Pila, MD Referring Physician Obstetrics and Gynecology  12/26/15     Subjective:  Vickie Bennett is a 40 y.o.  Female  present for CPE. All past medical history, surgical history, allergies, family history, immunizations, medications and social history were updated in the electronic medical record today. All recent labs, ED visits and hospitalizations within the last year were reviewed.  Pt complains today she has been experiencing dry mouth, and night sweats. She feels this is a side effect from her Zoloft. She states it has been present for some time, but she just connected the symptoms with the medication when she read the insertion to the med from the pharmacy. She feels she still needs some coverage, but would like to consider a different medicine.   Health maintenance:  Colonoscopy: No fhx, screen at 50 Mammogram: completed: No Fhx, screen 40. Doe snot perform SBE routinely.  Cervical cancer screening: Abnl 2005, last pap: 06/2015, results: Normal, completed by:Dr.  Adella Nissen Immunizations: tdap UTD 2012, Influenza UTD 2016(encouraged yearly), Infectious disease screening: HIV completed  DEXA: Never Assistive device: None Oxygen use: None Patient has a Dental home. Hospitalizations/ED visits: None  Immunization History  Administered Date(s) Administered  . Influenza Split 03/07/2011, 03/12/2012  . Influenza,inj,Quad PF,36+ Mos 05/04/2013, 03/02/2015  . Influenza-Unspecified 04/20/2014  . PPD Test 12/26/2015  . Tdap 03/07/2011     Past Medical History:  Diagnosis Date  . Anxiety   . ANXIETY DISORDER 04/21/2010  . Elevated BP 03/16/2012  . Hernia of anterior abdominal wall 06/20/2014  . HYPERLIPIDEMIA 04/21/2010  . HYPERLIPIDEMIA 04/21/2010   Qualifier: Diagnosis of  By: Charlett Blake MD, Erline Levine    . INSOMNIA 04/21/2010  . Plantar wart of right foot 10/07/2013  . Preventative health care 12/21/2013  . TACHYCARDIA 04/21/2010   Allergies  Allergen Reactions  . Sulfonamide Derivatives Nausea Only   Past Surgical History:  Procedure Laterality Date  . CESAREAN SECTION  2007  . DILATION AND CURETTAGE OF UTERUS     incomplete miscarriage  . LEEP  2005  . MANDIBLE SURGERY     b/l TMJ w/screws in place @ age 71   Family History  Problem Relation Age of Onset  . Hypertension Father   . Arthritis Father   . Migraines Mother   . Interstitial cystitis Mother   . Ulcers Maternal Grandmother     Peptic  . Anemia Maternal Grandmother   . Heart disease Maternal Grandfather   . Heart disease Paternal Grandfather   . Alzheimer's disease Paternal Grandmother   . Other Paternal Grandmother     Possible PE   Social History   Social History  . Marital status: Single    Spouse name: N/A  . Number of children: N/A  . Years of education: N/A   Occupational History  . Not on file.   Social History Main Topics  . Smoking status: Former Smoker    Quit date: 02/05/2009  . Smokeless tobacco: Never Used  . Alcohol use 0.0 oz/week     Comment: occasionaly/ very rare  . Drug use: No  . Sexual activity: Yes    Partners: Male   Other Topics Concern  . Not on file   Social History Narrative  . No narrative  on file     Medication List       Accurate as of 12/26/15 12:42 PM. Always use your most recent med list.          ALPRAZolam 0.25 MG tablet Commonly known as:  XANAX Take 1 tablet (0.25 mg total) by mouth at bedtime as needed for anxiety or sleep. 1/2 to 1 tab  For anxiety   desogestrel-ethinyl estradiol 0.15-0.02/0.01 MG (21/5) tablet Commonly known as:  KARIVA Take 1 tablet by mouth daily.   escitalopram 10 MG tablet Commonly known as:  LEXAPRO Take 1 tablet (10 mg total) by mouth daily.   metoprolol succinate 25 MG 24  hr tablet Commonly known as:  TOPROL-XL TAKE 1 TABLET (25 MG TOTAL) BY MOUTH DAILY.   zolpidem 10 MG tablet Commonly known as:  AMBIEN Take 1 tablet (10 mg total) by mouth at bedtime as needed for sleep.        No results found for this or any previous visit (from the past 2160 hour(s)).  No results found.   ROS: 14 pt review of systems performed and negative (unless mentioned in an HPI)  Objective: BP 109/75 (BP Location: Right Arm, Patient Position: Sitting, Cuff Size: Normal)   Pulse 80   Temp 98.6 F (37 C) (Oral)   Resp 20   Ht '5\' 3"'  (1.6 m)   Wt 124 lb 8 oz (56.5 kg)   LMP 12/03/2015 (Approximate)   SpO2 98%   BMI 22.05 kg/m  Gen: Afebrile. No acute distress. Nontoxic in appearance, well-developed, well-nourished,  Pleasant caucasian female.  HENT: AT. . Bilateral TM visualized and normal in appearance, normal external auditory canal. MMM, no oral lesions, adequate dentition. Bilateral nares within normal limits. Throat without erythema, ulcerations or exudates. no Cough on exam, no hoarseness on exam. Eyes:Pupils Equal Round Reactive to light, Extraocular movements intact,  Conjunctiva without redness, discharge or icterus. Neck/lymp/endocrine: Supple,no lymphadenopathy, no thyromegaly CV: RRR no murmur, noedema, +2/4 P posterior tibialis pulses.  Chest: CTAB, no wheeze, rhonchi or crackles. normal Respiratory effort. good Air movement. Abd: Soft. flat. NTND. BS presnet. no Masses palpated. No hepatosplenomegaly. No rebound tenderness or guarding. Skin: no rashes, purpura or petechiae. Warm and well-perfused. Skin intact. Neuro/Msk:  Normal gait. PERLA. EOMi. Alert. Oriented x3.  Cranial nerves II through XII intact. Muscle strength 5/5 upper/lower extremity. DTRs equal bilaterally. Psych: Normal affect, dress and demeanor. Normal speech. Normal thought content and judgment.   Assessment/plan: Vickie Bennett is a 40 y.o. female present for CPE without  PAP. Routine general medical examination at a health care facility Screening for iron deficiency anemia Screening for thyroid disorder Lipid screening Patient was encouraged to exercise greater than 150 minutes a week. Patient was encouraged to choose a diet filled with fresh fruits and vegetables, and lean meats. - has had elevated TSH in past.  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  - PPD placed today, pt to return within 48-72 hours for reading by nurse.   Generalized anxiety disorder - Discontinue zoloft 2/2 to night sweats and dry mouth.  - Start Lexapro 10 mg - follow in 4 weeks.    AVS provided to patient today for education/recommendation on gender specific health and safety maintenance.  Return in about 4 weeks (around 01/23/2016), or anxiety.  Electronically signed by: Howard Pouch, DO [place]

## 2015-12-27 NOTE — Telephone Encounter (Signed)
Spoke with patient reviewed lab results. 

## 2015-12-28 ENCOUNTER — Ambulatory Visit: Payer: BLUE CROSS/BLUE SHIELD

## 2015-12-28 LAB — TB SKIN TEST
INDURATION: 0 mm
TB SKIN TEST: NEGATIVE

## 2016-01-18 ENCOUNTER — Encounter: Payer: Self-pay | Admitting: Family Medicine

## 2016-01-18 ENCOUNTER — Other Ambulatory Visit: Payer: Self-pay | Admitting: Family Medicine

## 2016-01-18 MED ORDER — ZOLPIDEM TARTRATE 10 MG PO TABS: 10.0000 mg | ORAL_TABLET | Freq: Every evening | ORAL | 5 refills | Status: DC | PRN

## 2016-01-18 NOTE — Progress Notes (Signed)
ambien refilled 

## 2016-01-24 ENCOUNTER — Emergency Department (HOSPITAL_BASED_OUTPATIENT_CLINIC_OR_DEPARTMENT_OTHER)
Admission: EM | Admit: 2016-01-24 | Discharge: 2016-01-24 | Disposition: A | Discharge status: Home / Self Care | Payer: BC Managed Care – PPO | Attending: Emergency Medicine | Admitting: Emergency Medicine

## 2016-01-24 ENCOUNTER — Encounter (HOSPITAL_BASED_OUTPATIENT_CLINIC_OR_DEPARTMENT_OTHER): Payer: Self-pay | Admitting: *Deleted

## 2016-01-24 ENCOUNTER — Emergency Department (HOSPITAL_BASED_OUTPATIENT_CLINIC_OR_DEPARTMENT_OTHER): Payer: BC Managed Care – PPO

## 2016-01-24 DIAGNOSIS — N200 Calculus of kidney: Secondary | ICD-10-CM

## 2016-01-24 DIAGNOSIS — Z79899 Other long term (current) drug therapy: Secondary | ICD-10-CM | POA: Diagnosis not present

## 2016-01-24 DIAGNOSIS — Z87891 Personal history of nicotine dependence: Secondary | ICD-10-CM | POA: Diagnosis not present

## 2016-01-24 DIAGNOSIS — N202 Calculus of kidney with calculus of ureter: Secondary | ICD-10-CM | POA: Insufficient documentation

## 2016-01-24 DIAGNOSIS — R109 Unspecified abdominal pain: Secondary | ICD-10-CM | POA: Diagnosis present

## 2016-01-24 DIAGNOSIS — N201 Calculus of ureter: Secondary | ICD-10-CM

## 2016-01-24 LAB — URINALYSIS, ROUTINE W REFLEX MICROSCOPIC
BILIRUBIN URINE: NEGATIVE
GLUCOSE, UA: NEGATIVE mg/dL
HGB URINE DIPSTICK: NEGATIVE
Ketones, ur: NEGATIVE mg/dL
Leukocytes, UA: NEGATIVE
Nitrite: NEGATIVE
PROTEIN: NEGATIVE mg/dL
SPECIFIC GRAVITY, URINE: 1.021 (ref 1.005–1.030)
pH: 6 (ref 5.0–8.0)

## 2016-01-24 LAB — PREGNANCY, URINE: PREG TEST UR: NEGATIVE

## 2016-01-24 MED ORDER — TAMSULOSIN HCL 0.4 MG PO CAPS
0.4000 mg | ORAL_CAPSULE | Freq: Once | ORAL | Status: AC
  Administered 2016-01-24: 0.4 mg via ORAL
  Filled 2016-01-24: qty 1

## 2016-01-24 MED ORDER — TAMSULOSIN HCL 0.4 MG PO CAPS: ORAL_CAPSULE | ORAL | 0 refills | Status: DC

## 2016-01-24 MED ORDER — ONDANSETRON 8 MG PO TBDP: 8.0000 mg | ORAL_TABLET | Freq: Three times a day (TID) | ORAL | 0 refills | Status: DC | PRN

## 2016-01-24 MED ORDER — SODIUM CHLORIDE 0.9 % IV SOLN
Freq: Once | INTRAVENOUS | Status: AC
  Administered 2016-01-24: 06:00:00 via INTRAVENOUS

## 2016-01-24 MED ORDER — HYDROMORPHONE HCL 1 MG/ML IJ SOLN
1.0000 mg | Freq: Once | INTRAMUSCULAR | Status: AC
  Administered 2016-01-24: 1 mg via INTRAVENOUS
  Filled 2016-01-24: qty 1

## 2016-01-24 MED ORDER — HYDROMORPHONE HCL 4 MG PO TABS: 2.0000 mg | ORAL_TABLET | ORAL | 0 refills | Status: DC | PRN

## 2016-01-24 MED ORDER — ONDANSETRON HCL 4 MG/2ML IJ SOLN
4.0000 mg | Freq: Once | INTRAMUSCULAR | Status: AC
  Administered 2016-01-24: 4 mg via INTRAVENOUS
  Filled 2016-01-24: qty 2

## 2016-01-24 MED ORDER — KETOROLAC TROMETHAMINE 15 MG/ML IJ SOLN
15.0000 mg | Freq: Once | INTRAMUSCULAR | Status: AC
  Administered 2016-01-24: 15 mg via INTRAVENOUS
  Filled 2016-01-24: qty 1

## 2016-01-24 NOTE — ED Triage Notes (Signed)
C/o L flank pain, onset 0300, woke pt up, also nausea, "think it is a kidney stone", (denies: vd, fever, urinary sx, hematuria, change in stream or other sx), "h/o similar when pregnant, assumed to be a kidney stone, but unable to do a CT, wasn't able to see with [place] and never passed it", no meds PTA. Has a PCP, but no GU MD.

## 2016-01-24 NOTE — ED Provider Notes (Signed)
[facility] Provider Note   CSN: [id number] Arrival date & time: 01/24/16  0459     History   Chief Complaint Chief Complaint  Patient presents with  . Flank Pain    HPI Vickie Bennett is a 40 y.o. female with a history of nephrolithiasis. She is here with left flank pain that awakened her from sleep at 4:30 this morning. Her pain is severe and characterized as like previous kidney stones. There is associated nausea but no vomiting. She has not noticed any hematuria. She denies fever or chills. Pain is somewhat worse with movement or palpation of the left flank.  HPI  Past Medical History:  Diagnosis Date  . Anxiety   . ANXIETY DISORDER 04/21/2010  . Elevated BP 03/16/2012  . Hernia of anterior abdominal wall 06/20/2014  . HYPERLIPIDEMIA 04/21/2010  . HYPERLIPIDEMIA 04/21/2010   Qualifier: Diagnosis of  By: Charlett Blake MD, Erline Levine    . INSOMNIA 04/21/2010  . Plantar wart of right foot 10/07/2013  . Preventative health care 12/21/2013  . TACHYCARDIA 04/21/2010    Patient Active Problem List   Diagnosis Date Noted  . Elevated TSH 12/29/2014  . Irregular menstrual cycle 12/24/2014  . Hernia of anterior abdominal wall 06/20/2014  . Plantar fasciitis of left foot 06/20/2014  . Routine general medical examination at a health care facility 12/21/2013  . Cervical cancer screening 11/05/2012  . Generalized anxiety disorder 04/21/2010  . Insomnia 04/21/2010  . Tachycardia 04/21/2010    Past Surgical History:  Procedure Laterality Date  . CESAREAN SECTION  2007  . DILATION AND CURETTAGE OF UTERUS     incomplete miscarriage  . LEEP  2005  . MANDIBLE SURGERY     b/l TMJ w/screws in place @ age 89    OB History    No data available       Home Medications    Prior to Admission medications   Medication Sig Start Date End Date Taking? Authorizing Provider  ALPRAZolam (XANAX) 0.25 MG tablet Take 1 tablet (0.25 mg total) by mouth at bedtime as needed for anxiety or  sleep. 1/2 to 1 tab  For anxiety 06/28/15   Renee A Kuneff, DO  desogestrel-ethinyl estradiol (KARIVA) 0.15-0.02/0.01 MG (21/5) tablet Take 1 tablet by mouth daily. 06/28/15   Renee A Kuneff, DO  escitalopram (LEXAPRO) 10 MG tablet Take 1 tablet (10 mg total) by mouth daily. 12/26/15   Renee A Kuneff, DO  HYDROmorphone (DILAUDID) 4 MG tablet Take 0.5-1 tablets (2-4 mg total) by mouth every 4 (four) hours as needed for severe pain. 01/24/16   Paiton Fosco, MD  metoprolol succinate (TOPROL-XL) 25 MG 24 hr tablet TAKE 1 TABLET (25 MG TOTAL) BY MOUTH DAILY. 06/28/15   Renee A Kuneff, DO  ondansetron (ZOFRAN ODT) 8 MG disintegrating tablet Take 1 tablet (8 mg total) by mouth every 8 (eight) hours as needed. 01/24/16   Mart Colpitts, MD  tamsulosin (FLOMAX) 0.4 MG CAPS capsule Take one capsule daily until stone passes. 01/24/16   Mistina Coatney, MD  zolpidem (AMBIEN) 10 MG tablet Take 1 tablet (10 mg total) by mouth at bedtime as needed for sleep. 01/18/16   Renee A Raoul Pitch, DO    Family History Family History  Problem Relation Age of Onset  . Hypertension Father   . Arthritis Father   . Migraines Mother   . Interstitial cystitis Mother   . Ulcers Maternal Grandmother     Peptic  . Anemia Maternal Grandmother   .  Heart disease Maternal Grandfather   . Heart disease Paternal Grandfather   . Alzheimer's disease Paternal Grandmother   . Other Paternal Grandmother     Possible PE    Social History Social History  Substance Use Topics  . Smoking status: Former Smoker    Quit date: 02/05/2009  . Smokeless tobacco: Never Used  . Alcohol use 0.0 oz/week     Comment: occasionaly/ very rare     Allergies   Sulfonamide derivatives   Review of Systems Review of Systems  All other systems reviewed and are negative.    Physical Exam Updated Vital Signs BP 111/68   Pulse (!) 124   Temp 98.6 F (37 C) (Oral)   Resp 20   Ht 5\' 3"  (1.6 m)   Wt 125 lb (56.7 kg)   LMP 12/03/2015 (Approximate)   SpO2 99%    BMI 22.14 kg/m   Physical Exam General: Well-developed, well-nourished female in no acute distress; appearance consistent with age of record HENT: normocephalic; atraumatic Eyes: pupils equal, round and reactive to light; extraocular muscles intact Neck: supple Heart: regular rate and rhythm Lungs: clear to auscultation bilaterally Abdomen: soft; nondistended; nontender; no masses or hepatosplenomegaly; bowel sounds present GU: Left CVA tenderness Extremities: No deformity; full range of motion; pulses normal Neurologic: Awake, alert and oriented; motor function intact in all extremities and symmetric; no facial droop Skin: Warm and dry Psychiatric: Flat affect    ED Treatments / Results  Nursing notes and vitals signs, including pulse oximetry, reviewed.  Summary of this visit's results, reviewed by myself:  Labs:  Results for orders placed or performed during the hospital encounter of 01/24/16 (from the past 24 hour(s))  Urinalysis, Routine w reflex microscopic (not at [facility])     Status: Abnormal   Collection Time: 01/24/16  5:09 AM  Result Value Ref Range   Color, Urine YELLOW YELLOW   APPearance CLOUDY (A) CLEAR   Specific Gravity, Urine 1.021 1.005 - 1.030   pH 6.0 5.0 - 8.0   Glucose, UA NEGATIVE NEGATIVE mg/dL   Hgb urine dipstick NEGATIVE NEGATIVE   Bilirubin Urine NEGATIVE NEGATIVE   Ketones, ur NEGATIVE NEGATIVE mg/dL   Protein, ur NEGATIVE NEGATIVE mg/dL   Nitrite NEGATIVE NEGATIVE   Leukocytes, UA NEGATIVE NEGATIVE  Pregnancy, urine     Status: None   Collection Time: 01/24/16  5:09 AM  Result Value Ref Range   Preg Test, Ur NEGATIVE NEGATIVE    Imaging Studies: Ct Renal Stone Study  Result Date: 01/24/2016 CLINICAL DATA:  Acute onset of left flank pain.  Initial encounter. EXAM: CT ABDOMEN AND PELVIS WITHOUT CONTRAST TECHNIQUE: Multidetector CT imaging of the abdomen and pelvis was performed following the standard protocol without IV contrast. COMPARISON:   Renal ultrasound performed 07/24/2005 FINDINGS: The visualized lung bases are clear. The liver and spleen are unremarkable in appearance. The gallbladder is within normal limits. The pancreas and adrenal glands are unremarkable. There is mild left-sided hydronephrosis, left-sided perinephric stranding and fluid, and prominence of the left ureter along its entire course. This appears to reflect a 3 mm stone at the distal left ureter, just above the left vesicoureteral junction. Scattered nonobstructing bilateral renal stones are seen, measuring up to 4 mm in size. No free fluid is identified. The small bowel is unremarkable in appearance. The stomach is within normal limits. No acute vascular abnormalities are seen. The appendix is not definitely characterized. The cecum is noted at the right hemipelvis, with an intraperitoneal  course of the ascending colon. The colon is grossly unremarkable in appearance. The bladder is decompressed and not well assessed. The uterus is grossly unremarkable in appearance. The ovaries are relatively symmetric. No suspicious adnexal masses are seen. No inguinal lymphadenopathy is seen. No acute osseous abnormalities are identified. IMPRESSION: 1. Mild left-sided hydronephrosis, and left-sided perinephric stranding and fluid, with a likely 3 mm obstructing stone at the distal left ureter, just above the left vesicoureteral junction. 2. Scattered nonobstructing bilateral renal stones, measuring up to 4 mm in size. Electronically Signed   By: Garald Balding M.D.   On: 01/24/2016 06:46   6:57 AM Pain improved but some pain persists. We will administer Toradol IV. Patient advised to take a nonsteroidal such as ibuprofen or Aleve in addition to narcotic pain medicine.  Procedures (including critical care time)  Final Clinical Impressions(s) / ED Diagnoses   Final diagnoses:  Ureterolithiasis  Nephrolithiasis       Shanon Rosser, MD 01/24/16 [phone]

## 2016-01-24 NOTE — ED Notes (Signed)
Pt to CT

## 2016-01-25 ENCOUNTER — Ambulatory Visit: Payer: BLUE CROSS/BLUE SHIELD | Admitting: Family Medicine

## 2016-01-25 ENCOUNTER — Ambulatory Visit (INDEPENDENT_AMBULATORY_CARE_PROVIDER_SITE_OTHER): Payer: BC Managed Care – PPO | Admitting: Family Medicine

## 2016-01-25 ENCOUNTER — Encounter: Payer: Self-pay | Admitting: Family Medicine

## 2016-01-25 VITALS — BP 104/70 | HR 71 | Temp 98.1°F | Resp 20 | Ht 63.0 in | Wt 127.0 lb

## 2016-01-25 DIAGNOSIS — R7989 Other specified abnormal findings of blood chemistry: Secondary | ICD-10-CM

## 2016-01-25 DIAGNOSIS — R946 Abnormal results of thyroid function studies: Secondary | ICD-10-CM

## 2016-01-25 DIAGNOSIS — F411 Generalized anxiety disorder: Secondary | ICD-10-CM

## 2016-01-25 DIAGNOSIS — N200 Calculus of kidney: Secondary | ICD-10-CM | POA: Diagnosis not present

## 2016-01-25 HISTORY — DX: Calculus of kidney: N20.0

## 2016-01-25 LAB — T4, FREE: FREE T4: 0.97 ng/dL (ref 0.60–1.60)

## 2016-01-25 LAB — TSH: TSH: 5.17 u[IU]/mL — AB (ref 0.35–4.50)

## 2016-01-25 LAB — T3, FREE: T3 FREE: 3 pg/mL (ref 2.3–4.2)

## 2016-01-25 MED ORDER — ESCITALOPRAM OXALATE 10 MG PO TABS: 10.0000 mg | ORAL_TABLET | Freq: Every day | ORAL | 1 refills | Status: DC

## 2016-01-25 NOTE — Progress Notes (Signed)
Patient ID: Vickie Bennett, female  DOB: 25-Dec-1975, 40 y.o.   MRN: [id number] Patient Care Team    Relationship Specialty Notifications Start End  Ma Hillock, DO PCP - General Family Medicine  12/24/14   Oliver Pila, MD Referring Physician Obstetrics and Gynecology  12/26/15     Subjective:  Vickie Bennett is a 40 y.o.  Female  present for follow up on depression with anxiety.  Anxiety: Zoloft was discontinued secondary to uncontrolled anxiety and negative side effects approximate 4 weeks ago. Patient was started on Lexapro 10 mg daily and is tolerating medication well. She states that she feels her anxiety has stabilized on this medication. She denies any negative side effects. He has also due for repeat thyroid panel secondary to elevated TSH a few months ago.  Kidney stones: Pt in ED last night for kidney stone pain. Pain meds given, flomax prescribed, CT obtained with mild hydro left and multiple bilateral kidney stones. Pt states she has had about 4 attacks in her adult age.    Past Medical History:  Diagnosis Date  . Anxiety   . ANXIETY DISORDER 04/21/2010  . Elevated BP 03/16/2012  . Hernia of anterior abdominal wall 06/20/2014  . HYPERLIPIDEMIA 04/21/2010  . HYPERLIPIDEMIA 04/21/2010   Qualifier: Diagnosis of  By: Charlett Blake MD, Erline Levine    . INSOMNIA 04/21/2010  . Plantar wart of right foot 10/07/2013  . Preventative health care 12/21/2013  . Renal calculi   . TACHYCARDIA 04/21/2010   Allergies  Allergen Reactions  . Sulfonamide Derivatives Nausea Only   Past Surgical History:  Procedure Laterality Date  . CESAREAN SECTION  2007  . DILATION AND CURETTAGE OF UTERUS     incomplete miscarriage  . LEEP  2005  . MANDIBLE SURGERY     b/l TMJ w/screws in place @ age 9   Family History  Problem Relation Age of Onset  . Hypertension Father   . Arthritis Father   . Migraines Mother   . Interstitial cystitis Mother   . Ulcers Maternal Grandmother     Peptic  .  Anemia Maternal Grandmother   . Heart disease Maternal Grandfather   . Heart disease Paternal Grandfather   . Alzheimer's disease Paternal Grandmother   . Other Paternal Grandmother     Possible PE   Social History   Social History  . Marital status: Single    Spouse name: N/A  . Number of children: N/A  . Years of education: N/A   Occupational History  . Not on file.   Social History Main Topics  . Smoking status: Former Smoker    Quit date: 02/05/2009  . Smokeless tobacco: Never Used  . Alcohol use 0.0 oz/week     Comment: occasionaly/ very rare  . Drug use: No  . Sexual activity: Yes    Partners: Male   Other Topics Concern  . Not on file   Social History Narrative  . No narrative on file     Medication List       Accurate as of 01/25/16  3:32 PM. Always use your most recent med list.          ALPRAZolam 0.25 MG tablet Commonly known as:  XANAX Take 1 tablet (0.25 mg total) by mouth at bedtime as needed for anxiety or sleep. 1/2 to 1 tab  For anxiety   desogestrel-ethinyl estradiol 0.15-0.02/0.01 MG (21/5) tablet Commonly known as:  KARIVA Take 1 tablet by mouth daily.  escitalopram 10 MG tablet Commonly known as:  LEXAPRO Take 1 tablet (10 mg total) by mouth daily.   HYDROmorphone 4 MG tablet Commonly known as:  DILAUDID Take 0.5-1 tablets (2-4 mg total) by mouth every 4 (four) hours as needed for severe pain.   metoprolol succinate 25 MG 24 hr tablet Commonly known as:  TOPROL-XL TAKE 1 TABLET (25 MG TOTAL) BY MOUTH DAILY.   ondansetron 8 MG disintegrating tablet Commonly known as:  ZOFRAN ODT Take 1 tablet (8 mg total) by mouth every 8 (eight) hours as needed.   tamsulosin 0.4 MG Caps capsule Commonly known as:  FLOMAX Take one capsule daily until stone passes.   zolpidem 10 MG tablet Commonly known as:  AMBIEN Take 1 tablet (10 mg total) by mouth at bedtime as needed for sleep.        Recent Results (from the past 2160 hour(s))  CBC  w/Diff     Status: Abnormal   Collection Time: 12/26/15  8:57 AM  Result Value Ref Range   WBC 4.6 4.0 - 10.5 K/uL   RBC 4.56 3.87 - 5.11 Mil/uL   Hemoglobin 14.3 12.0 - 15.0 g/dL   HCT 42.7 36.0 - 46.0 %   MCV 93.6 78.0 - 100.0 fl   MCHC 33.5 30.0 - 36.0 g/dL   RDW 13.6 11.5 - 15.5 %   Platelets 169.0 150.0 - 400.0 K/uL   Neutrophils Relative % 42.7 (L) 43.0 - 77.0 %   Lymphocytes Relative 46.1 (H) 12.0 - 46.0 %   Monocytes Relative 7.3 3.0 - 12.0 %   Eosinophils Relative 3.3 0.0 - 5.0 %   Basophils Relative 0.6 0.0 - 3.0 %   Neutro Abs 2.0 1.4 - 7.7 K/uL   Lymphs Abs 2.1 0.7 - 4.0 K/uL   Monocytes Absolute 0.3 0.1 - 1.0 K/uL   Eosinophils Absolute 0.2 0.0 - 0.7 K/uL   Basophils Absolute 0.0 0.0 - 0.1 K/uL  Comp Met (CMET)     Status: None   Collection Time: 12/26/15  8:57 AM  Result Value Ref Range   Sodium 140 135 - 145 mEq/L   Potassium 4.2 3.5 - 5.1 mEq/L   Chloride 106 96 - 112 mEq/L   CO2 26 19 - 32 mEq/L   Glucose, Bld 90 70 - 99 mg/dL   BUN 10 6 - 23 mg/dL   Creatinine, Ser 0.75 0.40 - 1.20 mg/dL   Total Bilirubin 0.6 0.2 - 1.2 mg/dL   Alkaline Phosphatase 54 39 - 117 U/L   AST 13 0 - 37 U/L   ALT 11 0 - 35 U/L   Total Protein 6.6 6.0 - 8.3 g/dL   Albumin 4.1 3.5 - 5.2 g/dL   Calcium 9.3 8.4 - 10.5 mg/dL   GFR 91.88 >60.00 mL/min  TSH     Status: Abnormal   Collection Time: 12/26/15  8:57 AM  Result Value Ref Range   TSH 6.80 (H) 0.35 - 4.50 uIU/mL  Lipid panel     Status: None   Collection Time: 12/26/15  8:57 AM  Result Value Ref Range   Cholesterol 165 0 - 200 mg/dL    Comment: ATP III Classification       Desirable:  < 200 mg/dL               Borderline High:  200 - 239 mg/dL          High:  > = 240 mg/dL   Triglycerides 112.0 0.0 -  149.0 mg/dL    Comment: Normal:  <150 mg/dLBorderline High:  150 - 199 mg/dL   HDL 69.70 >39.00 mg/dL   VLDL 22.4 0.0 - 40.0 mg/dL   LDL Cholesterol 73 0 - 99 mg/dL   Total CHOL/HDL Ratio 2     Comment:                Men           Women1/2 Average Risk     3.4          3.3Average Risk          5.0          4.42X Average Risk          9.6          7.13X Average Risk          15.0          11.0                       NonHDL 94.96     Comment: NOTE:  Non-HDL goal should be 30 mg/dL higher than patient's LDL goal (i.e. LDL goal of < 70 mg/dL, would have non-HDL goal of < 100 mg/dL)  PPD     Status: None   Collection Time: 12/28/15  8:25 AM  Result Value Ref Range   TB Skin Test Negative    Induration 0 mm  Urinalysis, Routine w reflex microscopic (not at [facility])     Status: Abnormal   Collection Time: 01/24/16  5:09 AM  Result Value Ref Range   Color, Urine YELLOW YELLOW   APPearance CLOUDY (A) CLEAR   Specific Gravity, Urine 1.021 1.005 - 1.030   pH 6.0 5.0 - 8.0   Glucose, UA NEGATIVE NEGATIVE mg/dL   Hgb urine dipstick NEGATIVE NEGATIVE   Bilirubin Urine NEGATIVE NEGATIVE   Ketones, ur NEGATIVE NEGATIVE mg/dL   Protein, ur NEGATIVE NEGATIVE mg/dL   Nitrite NEGATIVE NEGATIVE   Leukocytes, UA NEGATIVE NEGATIVE    Comment: MICROSCOPIC NOT DONE ON URINES WITH NEGATIVE PROTEIN, BLOOD, LEUKOCYTES, NITRITE, OR GLUCOSE <1000 mg/dL.  Pregnancy, urine     Status: None   Collection Time: 01/24/16  5:09 AM  Result Value Ref Range   Preg Test, Ur NEGATIVE NEGATIVE    Comment:        THE SENSITIVITY OF THIS METHODOLOGY IS >20 mIU/mL.    Ct Renal Stone Study  Result Date: 01/24/2016 CLINICAL DATA:  Acute onset of left flank pain.  Initial encounter. EXAM: CT ABDOMEN AND PELVIS WITHOUT CONTRAST TECHNIQUE: Multidetector CT imaging of the abdomen and pelvis was performed following the standard protocol without IV contrast. COMPARISON:  Renal ultrasound performed 07/24/2005 FINDINGS: The visualized lung bases are clear. The liver and spleen are unremarkable in appearance. The gallbladder is within normal limits. The pancreas and adrenal glands are unremarkable. There is mild left-sided hydronephrosis, left-sided perinephric  stranding and fluid, and prominence of the left ureter along its entire course. This appears to reflect a 3 mm stone at the distal left ureter, just above the left vesicoureteral junction. Scattered nonobstructing bilateral renal stones are seen, measuring up to 4 mm in size. No free fluid is identified. The small bowel is unremarkable in appearance. The stomach is within normal limits. No acute vascular abnormalities are seen. The appendix is not definitely characterized. The cecum is noted at the right hemipelvis, with an intraperitoneal course of the ascending colon. The  colon is grossly unremarkable in appearance. The bladder is decompressed and not well assessed. The uterus is grossly unremarkable in appearance. The ovaries are relatively symmetric. No suspicious adnexal masses are seen. No inguinal lymphadenopathy is seen. No acute osseous abnormalities are identified. IMPRESSION: 1. Mild left-sided hydronephrosis, and left-sided perinephric stranding and fluid, with a likely 3 mm obstructing stone at the distal left ureter, just above the left vesicoureteral junction. 2. Scattered nonobstructing bilateral renal stones, measuring up to 4 mm in size. Electronically Signed   By: Garald Balding M.D.   On: 01/24/2016 06:46    No results found.   ROS: 14 pt review of systems performed and negative (unless mentioned in an HPI)  Objective: BP 104/70 (BP Location: Left Arm, Patient Position: Sitting, Cuff Size: Normal)   Pulse 71   Temp 98.1 F (36.7 C)   Resp 20   Ht '5\' 3"'  (1.6 m)   Wt 127 lb (57.6 kg)   LMP 01/25/2016   SpO2 99%   BMI 22.50 kg/m  Gen: Afebrile. No acute distress. Nontoxic in appearance, well-developed, well-nourished,  Pleasant caucasian female.Appears tired  HENT: AT. [place]. MMM Eyes:Pupils Equal Round Reactive to light, Extraocular movements intact,  Conjunctiva without redness, discharge or icterus. CV: RRR no murmur, no edema Psych: Normal affect, dress and demeanor. Normal  speech. Normal thought content and judgment.  Assessment/plan: Vickie Bennett is a 40 y.o. female present for CPE without PAP. Generalized anxiety disorder - Continue Lexapro 10 mg, 90 d refill. - follow in 3 months, if still doing well can extend to 6 month follow ups thereafter  Elevated TSH - h/o elevated tsh. Repeat collection due today.  - TSH - T4, free - T3, free  Nephrolithiasis Recent ED visit. Pt brings a sample of passed stone with her today. CT reflects multiple stones, bilateral kidneys with largest 76m.  - DC flomax- since passed stone - stone sent for analysis.  - urology referral for follow up with mild hydro left and multiple stones present.  - F/U PRN   Return in about 3 months (around 04/25/2016), or anxiety.  Electronically signed by: RHoward Pouch DO [place]

## 2016-01-25 NOTE — Patient Instructions (Addendum)
I will call you with labs once available.  Urology referral placed.  Lexapro continue, refills provided, follow up in 3 months.

## 2016-01-27 ENCOUNTER — Telehealth: Payer: Self-pay | Admitting: Family Medicine

## 2016-01-27 NOTE — Telephone Encounter (Signed)
Left message with lab results on patient voice mail 

## 2016-01-27 NOTE — Telephone Encounter (Signed)
Please call pt: Her thyroid studies are good. We will monitor yearly.

## 2016-01-28 LAB — STONE ANALYSIS: STONE WEIGHT KSTONE: 0.002 g

## 2016-01-29 ENCOUNTER — Telehealth: Payer: Self-pay | Admitting: Family Medicine

## 2016-01-29 NOTE — Telephone Encounter (Signed)
Please call pt: - the specimen she provided was a kidney stone made of calcium oxalate.  - Prevention: If she is taking a supplement of calcium she should discontinue, dietary calcium is ok. Watch the sodium content in her diet (lower sodium better). Drink plenty of water. - There has been a urology referral in place.

## 2016-01-30 NOTE — Telephone Encounter (Signed)
Spoke with patient reviewed lab results and instructions. Patient verbalized understanding. 

## 2016-03-12 ENCOUNTER — Other Ambulatory Visit: Payer: Self-pay | Admitting: *Deleted

## 2016-03-12 MED ORDER — METOPROLOL SUCCINATE ER 25 MG PO TB24: ORAL_TABLET | ORAL | 0 refills | Status: DC

## 2016-05-01 ENCOUNTER — Encounter: Payer: Self-pay | Admitting: Family Medicine

## 2016-05-01 ENCOUNTER — Ambulatory Visit (INDEPENDENT_AMBULATORY_CARE_PROVIDER_SITE_OTHER): Payer: BC Managed Care – PPO | Admitting: Family Medicine

## 2016-05-01 VITALS — BP 111/68 | HR 72 | Temp 97.9°F | Resp 20 | Ht 63.0 in | Wt 130.2 lb

## 2016-05-01 DIAGNOSIS — G4700 Insomnia, unspecified: Secondary | ICD-10-CM

## 2016-05-01 DIAGNOSIS — F411 Generalized anxiety disorder: Secondary | ICD-10-CM

## 2016-05-01 DIAGNOSIS — Z23 Encounter for immunization: Secondary | ICD-10-CM

## 2016-05-01 MED ORDER — ZOLPIDEM TARTRATE 10 MG PO TABS
10.0000 mg | ORAL_TABLET | Freq: Every evening | ORAL | 5 refills | Status: DC | PRN
Start: 2016-05-01 — End: 2016-10-31

## 2016-05-01 MED ORDER — ALPRAZOLAM 0.25 MG PO TABS: 0.2500 mg | ORAL_TABLET | Freq: Every evening | ORAL | 2 refills | Status: DC | PRN

## 2016-05-01 MED ORDER — ESCITALOPRAM OXALATE 10 MG PO TABS: 10.0000 mg | ORAL_TABLET | Freq: Every day | ORAL | 1 refills | Status: DC

## 2016-05-01 NOTE — Progress Notes (Signed)
[name]     Patient ID: Vickie Bennett, female  DOB: [birth year], 40 y.o.   MRN: [id number] Patient Care Team    Relationship Specialty Notifications Start End  Ma Hillock, DO PCP - General Family Medicine  12/24/14   Oliver Pila, MD Referring Physician Obstetrics and Gynecology  12/26/15     Subjective:  Vickie Bennett is a 40 y.o.  Female  present for follow up on depression with anxiety.  Anxiety:  Pt reports she is doing rather well on the lexapro 10 mg QD. She reports compliance with medication daily. She still has insomnia, and takes Ambien about every night. She reports she rarely takes xanax (0.25mg ), about once a week. She states that she feels her anxiety has stabilized on this medication. She denies any negative side effects. She was unable to tolerate zoloft.   Past Medical History:  Diagnosis Date  . Anxiety   . ANXIETY DISORDER 04/21/2010  . Elevated BP 03/16/2012  . Hernia of anterior abdominal wall 06/20/2014  . HYPERLIPIDEMIA 04/21/2010  . HYPERLIPIDEMIA 04/21/2010   Qualifier: Diagnosis of  By: Charlett Blake MD, Erline Levine    . INSOMNIA 04/21/2010  . Plantar wart of right foot 10/07/2013  . Preventative health care 12/21/2013  . Renal calculi   . TACHYCARDIA 04/21/2010   Allergies  Allergen Reactions  . Sulfonamide Derivatives Nausea Only   Past Surgical History:  Procedure Laterality Date  . CESAREAN SECTION  2007  . DILATION AND CURETTAGE OF UTERUS     incomplete miscarriage  . LEEP  2005  . MANDIBLE SURGERY     b/l TMJ w/screws in place @ age 20   Family History  Problem Relation Age of Onset  . Hypertension Father   . Arthritis Father   . Migraines Mother   . Interstitial cystitis Mother   . Ulcers Maternal Grandmother     Peptic  . Anemia Maternal Grandmother   . Heart disease Maternal Grandfather   . Heart disease Paternal Grandfather   . Alzheimer's disease Paternal Grandmother   . Other Paternal Grandmother     Possible PE   Social History    Social History  . Marital status: Single    Spouse name: N/A  . Number of children: N/A  . Years of education: N/A   Occupational History  . Not on file.   Social History Main Topics  . Smoking status: Former Smoker    Quit date: 02/05/2009  . Smokeless tobacco: Never Used  . Alcohol use 0.0 oz/week     Comment: occasionaly/ very rare  . Drug use: No  . Sexual activity: Yes    Partners: Male   Other Topics Concern  . Not on file   Social History Narrative  . No narrative on file     Medication List       Accurate as of 05/01/16  4:11 PM. Always use your most recent med list.          ALPRAZolam 0.25 MG tablet Commonly known as:  XANAX Take 1 tablet (0.25 mg total) by mouth at bedtime as needed for anxiety or sleep. 1/2 to 1 tab  For anxiety   desogestrel-ethinyl estradiol 0.15-0.02/0.01 MG (21/5) tablet Commonly known as:  KARIVA Take 1 tablet by mouth daily.   escitalopram 10 MG tablet Commonly known as:  LEXAPRO Take 1 tablet (10 mg total) by mouth daily.   metoprolol succinate 25 MG 24 hr tablet Commonly known as:  TOPROL-XL TAKE 1  TABLET (25 MG TOTAL) BY MOUTH DAILY.   zolpidem 10 MG tablet Commonly known as:  AMBIEN Take 1 tablet (10 mg total) by mouth at bedtime as needed for sleep.        No results found for this or any previous visit (from the past 2160 hour(s)). No results found.  No results found.   ROS: 14 pt review of systems performed and negative (unless mentioned in an HPI)  Objective: BP 111/68 (BP Location: Right Arm, Patient Position: Sitting, Cuff Size: Normal)   Pulse 72   Temp 97.9 F (36.6 C)   Resp 20   Ht 5\' 3"  (1.6 m)   Wt 130 lb 4 oz (59.1 kg)   SpO2 99%   BMI 23.07 kg/m   Gen: Afebrile. No acute distress.  HENT: AT. [place].  MMM.  Eyes:Pupils Equal Round Reactive to light, Extraocular movements intact,  Conjunctiva without redness, discharge or icterus. CV: RRR  Neuro: Normal gait. PERLA. EOMi. Alert.  Oriented.  Psych: Normal affect, dress and demeanor. Normal speech. Normal thought content and judgment.  Assessment/plan: Vickie Bennett is a 40 y.o. female present for CPE without PAP. Need for prophylactic vaccination and inoculation against influenza - Flu Vaccine QUAD 36+ mos PF IM (Fluarix & Fluzone Quad PF)  Generalized anxiety disorder - stable continue lexapro. Infrequently uses xanax (less than once a week).  - escitalopram (LEXAPRO) 10 MG tablet; Take 1 tablet (10 mg total) by mouth daily.  Dispense: 90 tablet; Refill: 1 - ALPRAZolam (XANAX) 0.25 MG tablet; Take 1 tablet (0.25 mg total) by mouth at bedtime as needed for anxiety or sleep. 1/2 to 1 tab  For anxiety  Dispense: 30 tablet; Refill: 2 - F/U 6 months.   Return in about 6 months (around 10/30/2016), or anxiety.  Electronically signed by: Howard Pouch, DO [place]

## 2016-05-01 NOTE — Patient Instructions (Signed)
I have refilled your Ambien, xanax and lexapro today.  Followup in 6 months as long you are doing well, if not be seen sooner.    I hope you have a wonderful Christmas!!!

## 2016-06-12 ENCOUNTER — Other Ambulatory Visit: Payer: Self-pay | Admitting: Family Medicine

## 2016-06-12 ENCOUNTER — Telehealth: Payer: Self-pay | Admitting: *Deleted

## 2016-06-12 NOTE — Telephone Encounter (Signed)
Please clarify with the pt the BCP she is requesting. This occurred before where the wrong BCP was requested and then Minerva Fester called in. I prescribed the Minerva Fester on Feb 2017. If she  Is on a different medication then it was not prescribed by me. I am ok with refilling script since her PAP is UTD, I just want to make sure she is asking correct provider and correct medication etc.  She did see a GYN, so may have been them.  Please verify before we call in med

## 2016-06-12 NOTE — Telephone Encounter (Signed)
Received a pharmacy request for Viorele 28 day. Last refill was for a generic on 06/28/15 1 pack with 5 refills  But pharmacy request states Rx written on 12/06/15. Do you want to fill this. Please advise

## 2016-06-13 ENCOUNTER — Encounter: Payer: Self-pay | Admitting: *Deleted

## 2016-06-13 ENCOUNTER — Encounter: Payer: Self-pay | Admitting: Family Medicine

## 2016-06-13 MED ORDER — DESOGESTREL-ETHINYL ESTRADIOL 0.15-0.02/0.01 MG (21/5) PO TABS: 1.0000 | ORAL_TABLET | Freq: Every day | ORAL | 11 refills | Status: DC

## 2016-06-13 NOTE — Telephone Encounter (Signed)
Spoke with patient she will check her BCP and call back to verify correct one she states we have been prescribing her pills . She thinks they probably have been filling as generic and this could be why a different name.

## 2016-06-13 NOTE — Telephone Encounter (Signed)
Refill on BCP sent to pharmacy.

## 2016-06-26 ENCOUNTER — Encounter: Payer: Self-pay | Admitting: Family Medicine

## 2016-07-02 ENCOUNTER — Ambulatory Visit (INDEPENDENT_AMBULATORY_CARE_PROVIDER_SITE_OTHER): Payer: BC Managed Care – PPO | Admitting: Family Medicine

## 2016-07-02 ENCOUNTER — Encounter: Payer: Self-pay | Admitting: Family Medicine

## 2016-07-02 VITALS — BP 118/78 | HR 85 | Temp 97.8°F | Resp 20 | Ht 63.0 in | Wt 131.8 lb

## 2016-07-02 DIAGNOSIS — G4700 Insomnia, unspecified: Secondary | ICD-10-CM | POA: Diagnosis not present

## 2016-07-02 DIAGNOSIS — F411 Generalized anxiety disorder: Secondary | ICD-10-CM | POA: Diagnosis not present

## 2016-07-02 NOTE — Patient Instructions (Signed)
Trial off lexapro is ok. Start taking 1/2 pil for 2-3 days, then stop.  If you feel anxious, have any panic attack, or think you need to get back on a medicine... Call in and we will start one of the ones we discussed today and I would see you 4 weeks after starting.

## 2016-07-02 NOTE — Progress Notes (Signed)
[name] Kitchenr     Patient ID: Vickie Bennett, female  DOB: December 31, 1975, 41 y.o.   MRN: [id number] Patient Care Team    Relationship Specialty Notifications Start End  Ma Hillock, DO PCP - General Family Medicine  12/24/14   Oliver Pila, MD Referring Physician Obstetrics and Gynecology  12/26/15     Subjective:  Vickie Bennett is a 41 y.o.  Female  present for follow up on depression with anxiety.  Anxiety/insomnia:  Pt feels she is experiencing weight gain and less anxiety. She is wanting to try to come off lexapro. She is rarely using the xanax. She uses Ambien for sleep when needed. She denies side effects to Ambien or xanax. She does not use them together.  She has been unable to tolerate Zoloft in the past. She does have occasional tachycardia, mostly related to anxiety.    Past Medical History:  Diagnosis Date  . Anxiety   . ANXIETY DISORDER 04/21/2010  . Elevated BP 03/16/2012  . Hernia of anterior abdominal wall 06/20/2014  . HYPERLIPIDEMIA 04/21/2010  . HYPERLIPIDEMIA 04/21/2010   Qualifier: Diagnosis of  By: Charlett Blake MD, Erline Levine    . INSOMNIA 04/21/2010  . Plantar wart of right foot 10/07/2013  . Preventative health care 12/21/2013  . Renal calculi   . TACHYCARDIA 04/21/2010   Allergies  Allergen Reactions  . Sulfonamide Derivatives Nausea Only   Past Surgical History:  Procedure Laterality Date  . CESAREAN SECTION  2007  . DILATION AND CURETTAGE OF UTERUS     incomplete miscarriage  . LEEP  2005  . MANDIBLE SURGERY     b/l TMJ w/screws in place @ age 17   Family History  Problem Relation Age of Onset  . Hypertension Father   . Arthritis Father   . Migraines Mother   . Interstitial cystitis Mother   . Ulcers Maternal Grandmother     Peptic  . Anemia Maternal Grandmother   . Heart disease Maternal Grandfather   . Heart disease Paternal Grandfather   . Alzheimer's disease Paternal Grandmother   . Other Paternal Grandmother     Possible PE   Social History    Social History  . Marital status: Single    Spouse name: N/A  . Number of children: N/A  . Years of education: N/A   Occupational History  . Not on file.   Social History Main Topics  . Smoking status: Former Smoker    Quit date: 02/05/2009  . Smokeless tobacco: Never Used  . Alcohol use 0.0 oz/week     Comment: occasionaly/ very rare  . Drug use: No  . Sexual activity: Yes    Partners: Male   Other Topics Concern  . Not on file   Social History Narrative  . No narrative on file   Allergies as of 07/02/2016      Reactions   Sulfonamide Derivatives Nausea Only      Medication List       Accurate as of 07/02/16  4:00 PM. Always use your most recent med list.          ALPRAZolam 0.25 MG tablet Commonly known as:  XANAX Take 1 tablet (0.25 mg total) by mouth at bedtime as needed for anxiety or sleep. 1/2 to 1 tab  For anxiety   desogestrel-ethinyl estradiol 0.15-0.02/0.01 MG (21/5) tablet Commonly known as:  VIORELE Take 1 tablet by mouth daily.   escitalopram 10 MG tablet Commonly known as:  LEXAPRO Take 1 tablet (  10 mg total) by mouth daily.   metoprolol succinate 25 MG 24 hr tablet Commonly known as:  TOPROL-XL TAKE 1 TABLET (25 MG TOTAL) BY MOUTH DAILY.   zolpidem 10 MG tablet Commonly known as:  AMBIEN Take 1 tablet (10 mg total) by mouth at bedtime as needed for sleep.        No results found for this or any previous visit (from the past 2160 hour(s)). No results found.  No results found.   ROS: 14 pt review of systems performed and negative (unless mentioned in an HPI)  Objective: BP 118/78 (BP Location: Left Arm, Patient Position: Sitting, Cuff Size: Normal)   Pulse 85   Temp 97.8 F (36.6 C)   Resp 20   Ht 5\' 3"  (1.6 m)   Wt 131 lb 12 oz (59.8 kg)   SpO2 100%   BMI 23.34 kg/m   Gen: Afebrile. No acute distress. Pleasant caucasian female.  HENT: AT. [place]. MMM.  Eyes:Pupils Equal Round Reactive to light, Extraocular movements  intact,  Conjunctiva without redness, discharge or icterus. CV: RRR  Chest: CTAB, no wheeze or crackles Neuro: Normal gait. PERLA. EOMi. Alert. Oriented.  Psych: Normal affect, dress and demeanor. Normal speech. Normal thought content and judgment.   Assessment/plan: TRACINA PINET is a 41 y.o. female present for CPE without PAP. Generalized anxiety disorder - DC lexapro, can taper over the next few days.Discussed alternatives if she finds she needs to restart a medication. Discussed Cymbalta and effexor today. Would start a lowest dose possible. - infrequent use of xanax. Last refilled 04/2016 with 2 refills.  - using ambien almost nightly.  - Controlled substance database reviewed and NORMAL today (ambien only) - F/U 6 months if continues to need xanax and ambien.   No Follow-up on file.  Electronically signed by: Howard Pouch, DO [place]

## 2016-08-20 ENCOUNTER — Ambulatory Visit (INDEPENDENT_AMBULATORY_CARE_PROVIDER_SITE_OTHER): Payer: BC Managed Care – PPO | Admitting: Obstetrics and Gynecology

## 2016-08-20 ENCOUNTER — Encounter: Payer: Self-pay | Admitting: Obstetrics and Gynecology

## 2016-08-20 VITALS — BP 118/60 | HR 80 | Resp 16 | Ht 63.0 in | Wt 128.0 lb

## 2016-08-20 DIAGNOSIS — Z01419 Encounter for gynecological examination (general) (routine) without abnormal findings: Secondary | ICD-10-CM | POA: Diagnosis not present

## 2016-08-20 MED ORDER — DESOGESTREL-ETHINYL ESTRADIOL 0.15-0.02/0.01 MG (21/5) PO TABS: 1.0000 | ORAL_TABLET | Freq: Every day | ORAL | 3 refills | Status: DC

## 2016-08-20 NOTE — Progress Notes (Signed)
41 y.o. [id number] Single Caucasian female here as a new patient for annual exam. Per patient, has seen Dr. Quincy Simmonds in the past.   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 Child is 62 year old.   Started exercising but her weight is the same for the last 3 weeks.  Doing portion control.   Stopped Lexapro 2 months ago.   Watching TFTs through PCP.  Some constipation. No hair thinning.  Some dry skin.  No excessive fatigue.   Declines future childbearing.   PCP: [place], [place]    Patient's last menstrual period was 08/08/2016.           Sexually active: Yes.    The current method of family planning is OCP (estrogen/progesterone).    Exercising: Yes.    walking, running on treadmill Smoker:  no  Health Maintenance: Pap:  06/2015 - Dr. Adella Nissen - [place] - normal per patient 12/24/14 w/ Dr. Raoul Pitch - pap negative;HR HPV not detected 12/16/13 - Dr. Raoul Pitch - Vaginal pap smear NEG History of abnormal Pap:  Yes, 2005 - Colpo/LEEP done.  LGSIL.  Margins negative.   All follow up paps normal.  MMG:  n/a Colonoscopy:  n/a BMD:   n/a TDaP:  UTD per patient Gardasil:   n/a HIV: with pregnancy  Hep C: unsure; may have tested with pregnancy Screening Labs:  Hb today: PCP does labs   reports that she quit smoking about 7 years ago. She has never used smokeless tobacco. She reports that she drinks alcohol. She reports that she does not use drugs.  Past Medical History:  Diagnosis Date  . Anxiety   . ANXIETY DISORDER 04/21/2010  . Elevated BP 03/16/2012  . Hernia of anterior abdominal wall 06/20/2014  . HYPERLIPIDEMIA 04/21/2010  . HYPERLIPIDEMIA 04/21/2010   Qualifier: Diagnosis of  By: Charlett Blake MD, Erline Levine    . INSOMNIA 04/21/2010  . Plantar wart of right foot 10/07/2013  . Preventative health care 12/21/2013  . Renal calculi   . TACHYCARDIA 04/21/2010    Past Surgical History:  Procedure Laterality Date  . CERVICAL BIOPSY  W/ LOOP ELECTRODE EXCISION    .  CESAREAN SECTION  2007  . COLPOSCOPY    . DILATION AND CURETTAGE OF UTERUS     incomplete miscarriage  . LEEP  2005  . MANDIBLE SURGERY     b/l TMJ w/screws in place @ age 88    Current Outpatient Prescriptions  Medication Sig Dispense Refill  . ALPRAZolam (XANAX) 0.25 MG tablet Take 1 tablet (0.25 mg total) by mouth at bedtime as needed for anxiety or sleep. 1/2 to 1 tab  For anxiety 30 tablet 2  . desogestrel-ethinyl estradiol (VIORELE) 0.15-0.02/0.01 MG (21/5) tablet Take 1 tablet by mouth daily. 1 Package 11  . metoprolol succinate (TOPROL-XL) 25 MG 24 hr tablet TAKE 1 TABLET (25 MG TOTAL) BY MOUTH DAILY. 90 tablet 0  . Probiotic Product (RESTORA PO) Take 1 tablet by mouth daily.    [name] Kitchen zolpidem (AMBIEN) 10 MG tablet Take 1 tablet (10 mg total) by mouth at bedtime as needed for sleep. 30 tablet 5   No current facility-administered medications for this visit.     Family History  Problem Relation Age of Onset  . Hypertension Father   . Arthritis Father   . Migraines Mother   . Interstitial cystitis Mother   . Ulcers Maternal Grandmother     Peptic  . Anemia Maternal Grandmother   .  Heart disease Maternal Grandfather   . Heart disease Paternal Grandfather   . Alzheimer's disease Paternal Grandmother   . Other Paternal Grandmother     Possible PE    ROS:  Pertinent items are noted in HPI.  Otherwise, a comprehensive ROS was negative.  Exam:   BP 118/60 (BP Location: Right Arm, Patient Position: Sitting, Cuff Size: Normal)   Pulse 80   Resp 16   Ht 5\' 3"  (1.6 m)   Wt 128 lb (58.1 kg)   LMP 08/08/2016   BMI 22.67 kg/m     General appearance: alert, cooperative and appears stated age Head: Normocephalic, without obvious abnormality, atraumatic Neck: no adenopathy, supple, symmetrical, trachea midline and thyroid normal to inspection and palpation Lungs: clear to auscultation bilaterally Breasts: normal appearance, no masses or tenderness, No nipple retraction or  dimpling, No nipple discharge or bleeding, No axillary or supraclavicular adenopathy Heart: regular rate and rhythm Abdomen: soft, non-tender; no masses, no organomegaly Extremities: extremities normal, atraumatic, no cyanosis or edema Skin: Skin color, texture, turgor normal. No rashes or lesions Lymph nodes: Cervical, supraclavicular, and axillary nodes normal. No abnormal inguinal nodes palpated Neurologic: Grossly normal  Pelvic: External genitalia:  no lesions              Urethra:  normal appearing urethra with no masses, tenderness or lesions              Bartholins and Skenes: normal                 Vagina: normal appearing vagina with normal color and discharge, no lesions              Cervix: no lesions.  Cervical os closed.  Nabothian cysts noted.               Pap taken: Yes.  Exocervical cells obtained.  Then cervix prepped with Hibiclens and scalpel used to open os 2 -3 mm.  Endocervical cells then obtained with cytobrush.  Bimanual Exam:  Uterus:  normal size, contour, position, consistency, mobility, non-tender              Adnexa: no mass, fullness, tenderness              Chaperone was present for exam.  Assessment:   Well woman visit with normal exam. Hx LEEP in 2005 - LGSIL. Cervical stenosis.  Hx elevated TSH level with normal free T3 fand free T4. Followed by PCP.  Difficulty with weight loss.  Recent use of SSRIs.  Plan: Mammogram screening discussed.    Recommended self breast awareness. Pap and HR HPV as above. Guidelines for Calcium, Vitamin D, regular exercise program including cardiovascular and weight bearing exercise. Labs with PCP.  Discussed options for contraception - tubal ligation/salpingectomy/IUDs/continuation of OCPs  She will continue with OCPs for now.  We discussed diet and exercise.  Effect of SSRI on weight gain also discussed. Follow up annually and prn.       After visit summary provided.

## 2016-08-20 NOTE — Patient Instructions (Signed)

## 2016-08-22 LAB — IPS PAP TEST WITH HPV

## 2016-08-28 ENCOUNTER — Telehealth: Payer: Self-pay

## 2016-08-28 NOTE — Telephone Encounter (Signed)
Spoke with patient. Advised of message and results as seen below from [place]. Patient verbalizes understanding. Appointment for repeat pap scheduled for 11/07/2016 at 10:30 am with Dr.Silva. Patient is agreeable to date and time. Recall for June 2018 placed.  Routing to provider for final review. Patient agreeable to disposition. Will close encounter.

## 2016-08-28 NOTE — Telephone Encounter (Signed)
-----   Message from Nunzio Cobbs, MD sent at 08/25/2016  6:05 PM EDT ----- Please report to patient that her pap was read as unsatisfactory.  There were not enough cells to interpret this pap.  The HR HPV was negative.  By protocol, this needs to be repeated in 2 months.  The HPV could be a false negative.  Please make an appointment for mid June 2018 and place in pap recall for June 2018.   Cc- Marisa Sprinkles

## 2016-09-11 ENCOUNTER — Other Ambulatory Visit: Payer: Self-pay | Admitting: *Deleted

## 2016-09-11 MED ORDER — METOPROLOL SUCCINATE ER 25 MG PO TB24: ORAL_TABLET | ORAL | 0 refills | Status: DC

## 2016-09-11 NOTE — Telephone Encounter (Signed)
Metoprolol refilled.

## 2016-10-23 NOTE — Progress Notes (Signed)
GYNECOLOGY  VISIT   HPI: 41 y.o.   Single  Caucasian  female   [id number] with Patient's last menstrual period was 10/04/2016.   here for repeat pap smear.    Pap on 08/20/16 was unsatisfactory and too few squamous cells to interpret.  HR HPV was negative.  Patient's cervical os was dilated open at that time with a scalpel due to stenosis.   Hx LEEP in 2005.  GYNECOLOGIC HISTORY: Patient's last menstrual period was 10/04/2016. Contraception:  OCP Menopausal hormone therapy:  n/a Last mammogram:  n/a Last pap smear:   08/20/16 Not enough cells to interpret pap; HR HPV was negative (per note, could be false negative) 06/2015 - Dr. Adella Nissen - [place] - normal per patient 12/24/14 w/ Dr. Raoul Pitch - pap negative;HR HPV not detected 12/16/13 - Dr. Raoul Pitch - Vaginal pap smear NEG        OB History    Gravida Para Term Preterm AB Living   2 1 1  0 1 1   SAB TAB Ectopic Multiple Live Births   1 0 0 0 0         Patient Active Problem List   Diagnosis Date Noted  . Nephrolithiasis 01/25/2016  . Elevated TSH 12/29/2014  . Irregular menstrual cycle 12/24/2014  . Hernia of anterior abdominal wall 06/20/2014  . Plantar fasciitis of left foot 06/20/2014  . Routine general medical examination at a health care facility 12/21/2013  . Generalized anxiety disorder 04/21/2010  . Insomnia 04/21/2010  . Tachycardia 04/21/2010    Past Medical History:  Diagnosis Date  . Anxiety   . ANXIETY DISORDER 04/21/2010  . Elevated BP 03/16/2012  . Hernia of anterior abdominal wall 06/20/2014  . HYPERLIPIDEMIA 04/21/2010  . HYPERLIPIDEMIA 04/21/2010   Qualifier: Diagnosis of  By: Charlett Blake MD, Erline Levine    . INSOMNIA 04/21/2010  . Plantar wart of right foot 10/07/2013  . Preventative health care 12/21/2013  . Renal calculi   . TACHYCARDIA 04/21/2010    Past Surgical History:  Procedure Laterality Date  . CERVICAL BIOPSY  W/ LOOP ELECTRODE EXCISION    . CESAREAN SECTION  2007  . COLPOSCOPY    . DILATION AND  CURETTAGE OF UTERUS     incomplete miscarriage  . LEEP  2005  . MANDIBLE SURGERY     b/l TMJ w/screws in place @ age 49    Current Outpatient Prescriptions  Medication Sig Dispense Refill  . ALPRAZolam (XANAX) 0.25 MG tablet Take 1 tablet (0.25 mg total) by mouth at bedtime as needed for anxiety or sleep. 1/2 to 1 tab  For anxiety 30 tablet 2  . desogestrel-ethinyl estradiol (VIORELE) 0.15-0.02/0.01 MG (21/5) tablet Take 1 tablet by mouth daily. 3 Package 3  . metoprolol succinate (TOPROL-XL) 25 MG 24 hr tablet TAKE 1 TABLET (25 MG TOTAL) BY MOUTH DAILY. 90 tablet 0  . Probiotic Product (RESTORA PO) Take 1 tablet by mouth daily.    [name] Kitchen zolpidem (AMBIEN) 10 MG tablet Take 1 tablet (10 mg total) by mouth at bedtime as needed for sleep. 30 tablet 5   No current facility-administered medications for this visit.      ALLERGIES: Sulfonamide derivatives  Family History  Problem Relation Age of Onset  . Hypertension Father   . Arthritis Father   . Migraines Mother   . Interstitial cystitis Mother   . Ulcers Maternal Grandmother        Peptic  . Anemia Maternal Grandmother   . Heart disease Maternal  Grandfather   . Heart disease Paternal Grandfather   . Alzheimer's disease Paternal Grandmother   . Other Paternal Grandmother        Possible PE    Social History   Social History  . Marital status: Single    Spouse name: N/A  . Number of children: N/A  . Years of education: N/A   Occupational History  . Not on file.   Social History Main Topics  . Smoking status: Former Smoker    Quit date: 02/05/2009  . Smokeless tobacco: Never Used  . Alcohol use 0.0 oz/week     Comment: occasionaly/ very rare  . Drug use: No  . Sexual activity: Yes    Partners: Male    Birth control/ protection: Pill   Other Topics Concern  . Not on file   Social History Narrative  . No narrative on file    ROS:  Pertinent items are noted in HPI.  PHYSICAL EXAMINATION:    BP 120/80 (BP  Location: Right Arm, Patient Position: Sitting, Cuff Size: Normal)   Pulse 72   Resp 16   Ht 5\' 3"  (1.6 m)   Wt 124 lb 12.8 oz (56.6 kg)   LMP 10/04/2016   BMI 22.11 kg/m     General appearance: alert, cooperative and appears stated age    Pelvic: External genitalia:  no lesions              Urethra:  normal appearing urethra with no masses, tenderness or lesions              Bartholins and Skenes: normal                 Vagina: normal appearing vagina with normal color and discharge, no lesions              Cervix: no lesions.  Consistent with LEEP.  Os appears small but open with mucous coming from the opening.                 Bimanual Exam:  Uterus:  normal size, contour, position, consistency, mobility, non-tender              Adnexa: no mass, fullness, tenderness               Chaperone was present for exam.  ASSESSMENT  Hx LEEP 2005.  LGSIL.  Cervical stenosis.  Inadequate pap.   PLAN  Pap and HR HPV testing performed.  Discussion of reasoning for repeating both today.    An After Visit Summary was printed and given to the patient.  ___15___ minutes face to face time of which over 50% was spent in counseling.

## 2016-10-24 ENCOUNTER — Encounter: Payer: Self-pay | Admitting: Obstetrics and Gynecology

## 2016-10-24 ENCOUNTER — Other Ambulatory Visit (HOSPITAL_COMMUNITY)
Admission: RE | Admit: 2016-10-24 | Discharge: 2016-10-24 | Disposition: A | Discharge status: Home / Self Care | Payer: BC Managed Care – PPO | Source: Ambulatory Visit | Attending: Obstetrics and Gynecology | Admitting: Obstetrics and Gynecology

## 2016-10-24 ENCOUNTER — Ambulatory Visit (INDEPENDENT_AMBULATORY_CARE_PROVIDER_SITE_OTHER): Payer: BC Managed Care – PPO | Admitting: Obstetrics and Gynecology

## 2016-10-24 VITALS — BP 120/80 | HR 72 | Resp 16 | Ht 63.0 in | Wt 124.8 lb

## 2016-10-24 DIAGNOSIS — Z124 Encounter for screening for malignant neoplasm of cervix: Secondary | ICD-10-CM | POA: Insufficient documentation

## 2016-10-26 LAB — CYTOLOGY - PAP
Adequacy: ABSENT
Diagnosis: NEGATIVE
HPV: NOT DETECTED

## 2016-10-31 ENCOUNTER — Other Ambulatory Visit: Payer: Self-pay | Admitting: *Deleted

## 2016-10-31 MED ORDER — ZOLPIDEM TARTRATE 10 MG PO TABS
10.0000 mg | ORAL_TABLET | Freq: Every evening | ORAL | 1 refills | Status: DC | PRN
Start: 2016-10-31 — End: 2016-11-12

## 2016-10-31 NOTE — Telephone Encounter (Signed)
Patient last seen 07/02/16 for insomnia and anxiety. Last refill was 05/01/16 for 30 tabs with 5 refills.

## 2016-11-01 ENCOUNTER — Encounter: Payer: Self-pay | Admitting: Family Medicine

## 2016-11-02 ENCOUNTER — Telehealth: Payer: Self-pay | Admitting: Family Medicine

## 2016-11-02 MED ORDER — PYRETHRINS-PIPERONYL BUTOXIDE 0.33-4 % EX SHAM: MEDICATED_SHAMPOO | Freq: Once | CUTANEOUS | 0 refills | Status: AC

## 2016-11-02 NOTE — Telephone Encounter (Signed)
Spoke with patient reviewed information and instructions. Patient verbalized understanding. 

## 2016-11-02 NOTE — Telephone Encounter (Signed)
Please call pt: - we can call in lice treatment for her, however I do believe it is over the counter as well.  - She should wash her hair with conditioner free shampoo, towel dry and then apply elimite to hair/scalp, behind ears, back of neck and leave on for 10 minutes (no longer).Rinse with warm water, remove nits with comb.

## 2016-11-05 ENCOUNTER — Ambulatory Visit: Payer: BC Managed Care – PPO | Admitting: Family Medicine

## 2016-11-07 ENCOUNTER — Ambulatory Visit: Payer: BC Managed Care – PPO | Admitting: Obstetrics and Gynecology

## 2016-11-12 ENCOUNTER — Ambulatory Visit (INDEPENDENT_AMBULATORY_CARE_PROVIDER_SITE_OTHER): Payer: BC Managed Care – PPO | Admitting: Family Medicine

## 2016-11-12 ENCOUNTER — Encounter: Payer: Self-pay | Admitting: Family Medicine

## 2016-11-12 VITALS — BP 98/67 | HR 85 | Temp 98.1°F | Resp 20 | Ht 63.0 in | Wt 124.5 lb

## 2016-11-12 DIAGNOSIS — G4700 Insomnia, unspecified: Secondary | ICD-10-CM

## 2016-11-12 DIAGNOSIS — R000 Tachycardia, unspecified: Secondary | ICD-10-CM

## 2016-11-12 DIAGNOSIS — F411 Generalized anxiety disorder: Secondary | ICD-10-CM | POA: Diagnosis not present

## 2016-11-12 MED ORDER — ZOLPIDEM TARTRATE 10 MG PO TABS: 10.0000 mg | ORAL_TABLET | Freq: Every evening | ORAL | 1 refills | Status: DC | PRN

## 2016-11-12 MED ORDER — ALPRAZOLAM 0.25 MG PO TABS: 0.2500 mg | ORAL_TABLET | Freq: Every evening | ORAL | 2 refills | Status: DC | PRN

## 2016-11-12 MED ORDER — METOPROLOL SUCCINATE ER 25 MG PO TB24: ORAL_TABLET | ORAL | 1 refills | Status: DC

## 2016-11-12 NOTE — Patient Instructions (Signed)
It was great to see you today.  I have refilled you meds for 6 months.  I hope you have a great Summer!!!

## 2016-11-12 NOTE — Progress Notes (Signed)
[name] Kitchenr     Patient ID: Vickie Bennett, female  DOB: 10/22/1975, 41 y.o.   MRN: [id number] Patient Care Team    Relationship Specialty Notifications Start End  Vickie Hillock, DO PCP - General Family Medicine  12/24/14   Vickie Pila, MD Referring Physician Obstetrics and Gynecology  12/26/15     Subjective:  Vickie Bennett is a 41 y.o.  Female  present for follow up on anxiety.  Anxiety/insomnia/Tachycardia: pt is doing rather well with her anxiety. She very rarely uses the xanax. Matter of fact her refill expired in the medication written 6 months ago. She endorses nightly Ambien use. She had used lexapro in the past, and tapered off after last visit without difficulty. She has been unable to tolerate Zoloft in the past. She does have occasional tachycardia, mostly related to anxiety and metoprolol controls this for her. She denies oversedation or nightmares with ambien.    Depression screen [facility] 2/9 11/12/2016 12/26/2015  Decreased Interest 0 0  Down, Depressed, Hopeless 0 0  PHQ - 2 Score 0 0  Altered sleeping 0 -  Tired, decreased energy 0 -  Change in appetite 0 -  Feeling bad or failure about yourself  0 -  Trouble concentrating 0 -  Moving slowly or fidgety/restless 0 -  Suicidal thoughts 0 -  PHQ-9 Score 0 -     Past Medical History:  Diagnosis Date  . Anxiety   . ANXIETY DISORDER 04/21/2010  . Elevated BP 03/16/2012  . Hernia of anterior abdominal wall 06/20/2014  . HYPERLIPIDEMIA 04/21/2010  . HYPERLIPIDEMIA 04/21/2010   Qualifier: Diagnosis of  By: Vickie Blake MD, Vickie Bennett    . INSOMNIA 04/21/2010  . Plantar wart of right foot 10/07/2013  . Preventative health care 12/21/2013  . Renal calculi   . TACHYCARDIA 04/21/2010   Allergies  Allergen Reactions  . Sulfonamide Derivatives Nausea Only   Past Surgical History:  Procedure Laterality Date  . CERVICAL BIOPSY  W/ LOOP ELECTRODE EXCISION    . CESAREAN SECTION  2007  . COLPOSCOPY    . DILATION AND CURETTAGE OF UTERUS       incomplete miscarriage  . LEEP  2005  . MANDIBLE SURGERY     b/l TMJ w/screws in place @ age 45   Family History  Problem Relation Age of Onset  . Hypertension Father   . Arthritis Father   . Migraines Mother   . Interstitial cystitis Mother   . Ulcers Maternal Grandmother        Peptic  . Anemia Maternal Grandmother   . Heart disease Maternal Grandfather   . Heart disease Paternal Grandfather   . Alzheimer's disease Paternal Grandmother   . Other Paternal Grandmother        Possible PE   Social History   Social History  . Marital status: Single    Spouse name: N/A  . Number of children: N/A  . Years of education: N/A   Occupational History  . Not on file.   Social History Main Topics  . Smoking status: Former Smoker    Quit date: 02/05/2009  . Smokeless tobacco: Never Used  . Alcohol use 0.0 oz/week     Comment: occasionaly/ very rare  . Drug use: No  . Sexual activity: Yes    Partners: Male    Birth control/ protection: Pill   Other Topics Concern  . Not on file   Social History Narrative  . No narrative on file  Allergies as of 11/12/2016      Reactions   Sulfonamide Derivatives Nausea Only      Medication List       Accurate as of 11/12/16 10:03 AM. Always use your most recent med list.          ALPRAZolam 0.25 MG tablet Commonly known as:  XANAX Take 1 tablet (0.25 mg total) by mouth at bedtime as needed for anxiety or sleep. 1/2 to 1 tab  For anxiety   desogestrel-ethinyl estradiol 0.15-0.02/0.01 MG (21/5) tablet Commonly known as:  VIORELE Take 1 tablet by mouth daily.   metoprolol succinate 25 MG 24 hr tablet Commonly known as:  TOPROL-XL TAKE 1 TABLET (25 MG TOTAL) BY MOUTH DAILY.   zolpidem 10 MG tablet Commonly known as:  AMBIEN Take 1 tablet (10 mg total) by mouth at bedtime as needed for sleep.        No results found.  No results found.   ROS: 14 pt review of systems performed and negative (unless mentioned in an  HPI)  Objective: BP 98/67 (BP Location: Right Arm, Patient Position: Sitting, Cuff Size: Normal)   Pulse 85   Temp 98.1 F (36.7 C)   Resp 20   Ht 5\' 3"  (1.6 m)   Wt 124 lb 8 oz (56.5 kg)   SpO2 99%   BMI 22.05 kg/m   Gen: Afebrile. No acute distress.  HENT: AT. [place].  MMM.  Eyes:Pupils Equal Round Reactive to light, Extraocular movements intact,  Conjunctiva without redness, discharge or icterus. CV: RRR no murmur, no edema Chest: CTAB, no wheeze or crackles  Neuro:  Normal gait. PERLA. EOMi. Alert. Oriented. Psych: Normal affect, dress and demeanor. Normal speech. Normal thought content and judgment..  Assessment/plan: Vickie Bennett is a 41 y.o. female present for  Generalized anxiety disorder/tachycardia/insomnia - stable. Doing well. Rarely using xanax.  - depression screening by PHQ9 negative today - ambien and xanax refilled for 6 months.  - refills on metoprolol.  - [place] printed, reviewed and made part of her permanent  records  Today. Appropriate.  - Controlled substance contact signed today.  - F/U 6 months   Return in about 6 months (around 05/14/2017) for anxiety/insomnia/tachycardia..  Electronically signed by: Vickie Pouch, DO [place]

## 2016-12-26 ENCOUNTER — Encounter: Payer: Self-pay | Admitting: Family Medicine

## 2016-12-26 ENCOUNTER — Ambulatory Visit (INDEPENDENT_AMBULATORY_CARE_PROVIDER_SITE_OTHER): Payer: BC Managed Care – PPO | Admitting: Family Medicine

## 2016-12-26 VITALS — BP 100/68 | HR 82 | Temp 98.1°F | Resp 18 | Ht 63.0 in | Wt 123.0 lb

## 2016-12-26 DIAGNOSIS — Z0000 Encounter for general adult medical examination without abnormal findings: Secondary | ICD-10-CM | POA: Diagnosis not present

## 2016-12-26 DIAGNOSIS — Z79899 Other long term (current) drug therapy: Secondary | ICD-10-CM

## 2016-12-26 DIAGNOSIS — Z13220 Encounter for screening for lipoid disorders: Secondary | ICD-10-CM | POA: Diagnosis not present

## 2016-12-26 DIAGNOSIS — Z130 Encounter for screening for diseases of the blood and blood-forming organs and certain disorders involving the immune mechanism: Secondary | ICD-10-CM

## 2016-12-26 DIAGNOSIS — R946 Abnormal results of thyroid function studies: Secondary | ICD-10-CM | POA: Diagnosis not present

## 2016-12-26 DIAGNOSIS — R7989 Other specified abnormal findings of blood chemistry: Secondary | ICD-10-CM

## 2016-12-26 DIAGNOSIS — Z131 Encounter for screening for diabetes mellitus: Secondary | ICD-10-CM

## 2016-12-26 LAB — COMPREHENSIVE METABOLIC PANEL
ALK PHOS: 55 U/L (ref 39–117)
ALT: 14 U/L (ref 0–35)
AST: 12 U/L (ref 0–37)
Albumin: 4.1 g/dL (ref 3.5–5.2)
BILIRUBIN TOTAL: 0.6 mg/dL (ref 0.2–1.2)
BUN: 10 mg/dL (ref 6–23)
CO2: 29 meq/L (ref 19–32)
Calcium: 9.2 mg/dL (ref 8.4–10.5)
Chloride: 107 mEq/L (ref 96–112)
Creatinine, Ser: 0.85 mg/dL (ref 0.40–1.20)
GFR: 79.1 mL/min (ref >60.00)
GLUCOSE: 86 mg/dL (ref 70–99)
Potassium: 4.2 mEq/L (ref 3.5–5.1)
SODIUM: 139 meq/L (ref 135–145)
TOTAL PROTEIN: 6.7 g/dL (ref 6.0–8.3)

## 2016-12-26 LAB — CBC WITH DIFFERENTIAL/PLATELET
BASOS ABS: 0 10*3/uL (ref 0.0–0.1)
Basophils Relative: 1.3 % (ref 0.0–3.0)
EOS ABS: 0.1 10*3/uL (ref 0.0–0.7)
Eosinophils Relative: 3.5 % (ref 0.0–5.0)
HCT: 45.6 % (ref 36.0–46.0)
Hemoglobin: 14.9 g/dL (ref 12.0–15.0)
LYMPHS ABS: 1.8 10*3/uL (ref 0.7–4.0)
Lymphocytes Relative: 48.9 % — ABNORMAL HIGH (ref 12.0–46.0)
MCHC: 32.7 g/dL (ref 30.0–36.0)
MCV: 96.1 fl (ref 78.0–100.0)
MONO ABS: 0.3 10*3/uL (ref 0.1–1.0)
MONOS PCT: 7.1 % (ref 3.0–12.0)
NEUTROS ABS: 1.4 10*3/uL (ref 1.4–7.7)
NEUTROS PCT: 39.2 % — AB (ref 43.0–77.0)
PLATELETS: 154 10*3/uL (ref 150.0–400.0)
RBC: 4.75 Mil/uL (ref 3.87–5.11)
RDW: 12.8 % (ref 11.5–15.5)
WBC: 3.6 10*3/uL — ABNORMAL LOW (ref 4.0–10.5)

## 2016-12-26 LAB — LIPID PANEL
CHOL/HDL RATIO: 2
Cholesterol: 158 mg/dL (ref 0–200)
HDL: 72 mg/dL (ref >39.00)
LDL Cholesterol: 69 mg/dL (ref 0–99)
NONHDL: 85.68
Triglycerides: 81 mg/dL (ref 0.0–149.0)
VLDL: 16.2 mg/dL (ref 0.0–40.0)

## 2016-12-26 LAB — TSH: TSH: 4.3 u[IU]/mL (ref 0.35–4.50)

## 2016-12-26 LAB — HEMOGLOBIN A1C: Hgb A1c MFr Bld: 5.3 % (ref 4.6–6.5)

## 2016-12-26 NOTE — Progress Notes (Signed)
Patient ID: Vickie Bennett, female  DOB: Feb 18, 1976, 41 y.o.   MRN: [id number] Patient Care Team    Relationship Specialty Notifications Start End  Ma Hillock, DO PCP - General Family Medicine  12/24/14   Oliver Pila, MD Referring Physician Obstetrics and Gynecology  12/26/15     Subjective:  Vickie Bennett is a 41 y.o.  Female  present for CPE. All past medical history, surgical history, allergies, family history, immunizations and social history was updated into the electronic medical record.  No Complaints.  Health maintenance: Updated 12/26/2016 Colonoscopy: No fhx, screen at 52 Mammogram: completed: No Fhx, screen 40. SBE not  Routinely, but getting better at trying.  Cervical cancer screening: Abnl 2005, last pap:10/24/2016, results: Normal, neg co test completed by:Dr.  Quincy Simmonds Immunizations: tdap UTD 2012, Influenza UTD 2017(encouraged yearly), Infectious disease screening: HIV completed  DEXA: Never Assistive device: None Oxygen use: None Patient has a Dental home, with routine visits.  Hospitalizations/ED visits: None  Immunization History  Administered Date(s) Administered  . Influenza Split 03/07/2011, 03/12/2012  . Influenza,inj,Quad PF,36+ Mos 05/04/2013, 03/02/2015, 05/01/2016  . Influenza-Unspecified 04/20/2014  . PPD Test 12/26/2015  . Tdap 03/07/2011     Past Medical History:  Diagnosis Date  . Anxiety   . ANXIETY DISORDER 04/21/2010  . Elevated BP 03/16/2012  . Hernia of anterior abdominal wall 06/20/2014  . HYPERLIPIDEMIA 04/21/2010  . HYPERLIPIDEMIA 04/21/2010   Qualifier: Diagnosis of  By: Charlett Blake MD, Erline Levine    . INSOMNIA 04/21/2010  . Plantar wart of right foot 10/07/2013  . Preventative health care 12/21/2013  . Renal calculi   . TACHYCARDIA 04/21/2010   Allergies  Allergen Reactions  . Sulfonamide Derivatives Nausea Only   Past Surgical History:  Procedure Laterality Date  . CERVICAL BIOPSY  W/ LOOP ELECTRODE EXCISION    . CESAREAN  SECTION  2007  . COLPOSCOPY    . DILATION AND CURETTAGE OF UTERUS     incomplete miscarriage  . LEEP  2005  . MANDIBLE SURGERY     b/l TMJ w/screws in place @ age 10   Family History  Problem Relation Age of Onset  . Hypertension Father   . Arthritis Father   . Migraines Mother   . Interstitial cystitis Mother   . Ulcers Maternal Grandmother        Peptic  . Anemia Maternal Grandmother   . Heart disease Maternal Grandfather   . Heart disease Paternal Grandfather   . Alzheimer's disease Paternal Grandmother   . Other Paternal Grandmother        Possible PE   Social History   Social History  . Marital status: Single    Spouse name: N/A  . Number of children: N/A  . Years of education: N/A   Occupational History  . Not on file.   Social History Main Topics  . Smoking status: Former Smoker    Quit date: 02/05/2009  . Smokeless tobacco: Never Used  . Alcohol use 0.0 oz/week     Comment: occasionaly/ very rare  . Drug use: No  . Sexual activity: Yes    Partners: Male    Birth control/ protection: Pill   Other Topics Concern  . Not on file   Social History Narrative  . No narrative on file   Allergies as of 12/26/2016      Reactions   Sulfonamide Derivatives Nausea Only      Medication List  Accurate as of 12/26/16  8:44 AM. Always use your most recent med list.          ALPRAZolam 0.25 MG tablet Commonly known as:  XANAX Take 1 tablet (0.25 mg total) by mouth at bedtime as needed for anxiety or sleep. 1/2 to 1 tab  For anxiety   desogestrel-ethinyl estradiol 0.15-0.02/0.01 MG (21/5) tablet Commonly known as:  VIORELE Take 1 tablet by mouth daily.   metoprolol succinate 25 MG 24 hr tablet Commonly known as:  TOPROL-XL TAKE 1 TABLET (25 MG TOTAL) BY MOUTH DAILY.   zolpidem 10 MG tablet Commonly known as:  AMBIEN Take 1 tablet (10 mg total) by mouth at bedtime as needed for sleep.        Recent Results (from the past 2160 hour(s))  Cytology  - PAP     Status: None   Collection Time: 10/24/16 12:00 AM  Result Value Ref Range   Adequacy      Satisfactory for evaluation  endocervical/transformation zone component ABSENT.   Diagnosis      NEGATIVE FOR INTRAEPITHELIAL LESIONS OR MALIGNANCY.   HPV NOT DETECTED     Comment: Normal Reference Range - NOT Detected   Material Submitted CervicoVaginal Pap [ThinPrep Imaged]     No results found.   ROS: 14 pt review of systems performed and negative (unless mentioned in an HPI)  Objective: BP 100/68 (BP Location: Left Arm, Patient Position: Sitting, Cuff Size: Normal)   Pulse 82   Temp 98.1 F (36.7 C)   Resp 18   Ht 5' 3" (1.6 m)   Wt 123 lb (55.8 kg)   LMP 12/26/2016   SpO2 98%   BMI 21.79 kg/m  Gen: Afebrile. No acute distress. Nontoxic, very pleasant caucasian female.  HENT: AT. [place]. Bilateral TM visualized and normal in appearance. MMM. Bilateral nares without erythema or buldging. Throat without erythema or exudates. No cough. No hoarseness Eyes:Pupils Equal Round Reactive to light, Extraocular movements intact,  Conjunctiva without redness, discharge or icterus. Neck/lymp/endocrine: Supple,no lymphadenopathy, no thyromegaly CV: RRR no murmur, no edema, +2/4 P posterior tibialis pulses, no carotid bruits. No JVD Chest: CTAB, no wheeze or crackles Abd: Soft. flat. NTND. BS +. No  Masses palpated. No HSM MSK: no deformities, full ROM. NV intact.  Skin: no rashes, purpura or petechiae.  Neuro:  Normal gait. PERLA. EOMi. Alert. Oriented. Cranial nerves II through XII intact. Muscle strength 5/5 U/L extremity. DTRs equal bilaterally. Psych: Normal affect, dress and demeanor. Normal speech. Normal thought content and judgment.  Assessment/plan: Vickie Bennett is a 41 y.o. female present for CPE  Routine general medical examination at a health care facility/Preventive Pt again encouraged to exercise greater than 150 minutes a week. Patient was encouraged to choose a diet  filled with fresh fruits and vegetables, and lean meats.  - She is UTD with immunizations and will be due for flu shot this Sept.  - PAP UTD, continue to follow with Dr. Silva. Routine SBE recommended.  - has had elevated TSH in past, with anxiety d/o ---> TSH, routine visits for anxiety/controlled substance.  Elevated TSH - TSH Screening for iron deficiency anemia - CBC w/Diff Encounter for long-term current use of medication - Comp Met (CMET)  Lipid screening - Lipid panel Screening for diabetes mellitus - HgB A1c    Return in about 1 year (around 12/26/2017) for CPE.  Electronically signed by: Renee Kuneff, DO [facility]  

## 2016-12-26 NOTE — Patient Instructions (Signed)

## 2016-12-28 ENCOUNTER — Telehealth: Payer: Self-pay | Admitting: Family Medicine

## 2016-12-28 NOTE — Telephone Encounter (Signed)
error 

## 2016-12-28 NOTE — Telephone Encounter (Signed)
Please call pt: - her labs are stable. She does have mild lowering in her one of her white blood cells, but not enough to suggest an issue of any type. I would want to recheck this in about 4 months with provider appt.  - again, no major concerns, just being cautious.

## 2016-12-28 NOTE — Telephone Encounter (Signed)
Patient notified and verbalized understanding.  Patient has an appt for 05/11/17.

## 2016-12-28 NOTE — Telephone Encounter (Signed)
Message left on voice mail for patient to return call. 

## 2017-05-01 ENCOUNTER — Encounter: Payer: Self-pay | Admitting: Family Medicine

## 2017-05-01 ENCOUNTER — Ambulatory Visit: Payer: BC Managed Care – PPO | Admitting: Family Medicine

## 2017-05-01 VITALS — BP 101/68 | HR 81 | Temp 97.5°F | Wt 125.0 lb

## 2017-05-01 DIAGNOSIS — F411 Generalized anxiety disorder: Secondary | ICD-10-CM | POA: Diagnosis not present

## 2017-05-01 DIAGNOSIS — Z23 Encounter for immunization: Secondary | ICD-10-CM | POA: Diagnosis not present

## 2017-05-01 DIAGNOSIS — R000 Tachycardia, unspecified: Secondary | ICD-10-CM

## 2017-05-01 DIAGNOSIS — D709 Neutropenia, unspecified: Secondary | ICD-10-CM

## 2017-05-01 DIAGNOSIS — G4700 Insomnia, unspecified: Secondary | ICD-10-CM

## 2017-05-01 DIAGNOSIS — N200 Calculus of kidney: Secondary | ICD-10-CM

## 2017-05-01 MED ORDER — ZOLPIDEM TARTRATE 10 MG PO TABS: 10.0000 mg | ORAL_TABLET | Freq: Every evening | ORAL | 1 refills | Status: DC | PRN

## 2017-05-01 MED ORDER — METOPROLOL SUCCINATE ER 25 MG PO TB24: ORAL_TABLET | ORAL | 1 refills | Status: DC

## 2017-05-01 MED ORDER — ALPRAZOLAM 0.25 MG PO TABS: 0.2500 mg | ORAL_TABLET | Freq: Every evening | ORAL | 1 refills | Status: DC | PRN

## 2017-05-01 MED ORDER — TAMSULOSIN HCL 0.4 MG PO CAPS: ORAL_CAPSULE | ORAL | 0 refills | Status: DC

## 2017-05-01 NOTE — Progress Notes (Signed)
[name] Kitchenr     Patient ID: Vickie Bennett, female  DOB: November 28, 1975, 41 y.o.   MRN: [id number] Patient Care Team    Relationship Specialty Notifications Start End  Ma Hillock, DO PCP - General Family Medicine  12/24/14   Oliver Pila, MD Referring Physician Obstetrics and Gynecology  12/26/15     Subjective:  Vickie Bennett is a 41 y.o.  Female  present for follow up on anxiety.  Anxiety/insomnia/Tachycardia: Patient doing well with her anxiety. She uses ambien nightly. Xanax is used as needed for anxiety. She had used lexapro in the past, and tapered off after last visit without difficulty. She has been unable to tolerate Zoloft in the past. She does have occasional tachycardia, mostly related to anxiety and metoprolol still controls this for her. She denies oversedation or nightmares with ambien.   Low wbc: Pt has had decreased neutrophils over the last year. Will complete today. She has not been ill recently.   Kidney stone: She has passed another kidney stone. She brings it with her today and it measures 4 mm. She report she took left over flomax and dilaudid from prior kidney stone. She was not seen for this condition.   Depression screen [facility] 2/9 12/26/2016 11/12/2016 12/26/2015  Decreased Interest 0 0 0  Down, Depressed, Hopeless 0 0 0  PHQ - 2 Score 0 0 0  Altered sleeping - 0 -  Tired, decreased energy - 0 -  Change in appetite - 0 -  Feeling bad or failure about yourself  - 0 -  Trouble concentrating - 0 -  Moving slowly or fidgety/restless - 0 -  Suicidal thoughts - 0 -  PHQ-9 Score - 0 -     Past Medical History:  Diagnosis Date  . Anxiety   . ANXIETY DISORDER 04/21/2010  . Elevated BP 03/16/2012  . Hernia of anterior abdominal wall 06/20/2014  . HYPERLIPIDEMIA 04/21/2010  . HYPERLIPIDEMIA 04/21/2010   Qualifier: Diagnosis of  By: Charlett Blake MD, Erline Levine    . INSOMNIA 04/21/2010  . Plantar wart of right foot 10/07/2013  . Preventative health care 12/21/2013  . Renal calculi     . TACHYCARDIA 04/21/2010   Allergies  Allergen Reactions  . Sulfonamide Derivatives Nausea Only   Past Surgical History:  Procedure Laterality Date  . CERVICAL BIOPSY  W/ LOOP ELECTRODE EXCISION    . CESAREAN SECTION  2007  . COLPOSCOPY    . DILATION AND CURETTAGE OF UTERUS     incomplete miscarriage  . LEEP  2005  . MANDIBLE SURGERY     b/l TMJ w/screws in place @ age 32   Family History  Problem Relation Age of Onset  . Hypertension Father   . Arthritis Father   . Migraines Mother   . Interstitial cystitis Mother   . Ulcers Maternal Grandmother        Peptic  . Anemia Maternal Grandmother   . Heart disease Maternal Grandfather   . Heart disease Paternal Grandfather   . Alzheimer's disease Paternal Grandmother   . Other Paternal Grandmother        Possible PE   Social History   Socioeconomic History  . Marital status: Single    Spouse name: Not on file  . Number of children: Not on file  . Years of education: Not on file  . Highest education level: Not on file  Social Needs  . Financial resource strain: Not on file  . Food insecurity - worry: Not  on file  . Food insecurity - inability: Not on file  . Transportation needs - medical: Not on file  . Transportation needs - non-medical: Not on file  Occupational History  . Not on file  Tobacco Use  . Smoking status: Former Smoker    Last attempt to quit: 02/05/2009    Years since quitting: 8.2  . Smokeless tobacco: Never Used  Substance and Sexual Activity  . Alcohol use: Yes    Alcohol/week: 0.0 oz    Comment: occasionaly/ very rare  . Drug use: No  . Sexual activity: Yes    Partners: Male    Birth control/protection: Pill  Other Topics Concern  . Not on file  Social History Narrative  . Not on file   Allergies as of 05/01/2017      Reactions   Sulfonamide Derivatives Nausea Only      Medication List        Accurate as of 05/01/17  3:24 PM. Always use your most recent med list.           ALPRAZolam 0.25 MG tablet Commonly known as:  XANAX Take 1 tablet (0.25 mg total) by mouth at bedtime as needed for anxiety or sleep. 1/2 to 1 tab  For anxiety   desogestrel-ethinyl estradiol 0.15-0.02/0.01 MG (21/5) tablet Commonly known as:  VIORELE Take 1 tablet by mouth daily.   metoprolol succinate 25 MG 24 hr tablet Commonly known as:  TOPROL-XL TAKE 1 TABLET (25 MG TOTAL) BY MOUTH DAILY.   tamsulosin 0.4 MG Caps capsule Commonly known as:  FLOMAX Take one capsule daily until stone passes.   zolpidem 10 MG tablet Commonly known as:  AMBIEN Take 1 tablet (10 mg total) by mouth at bedtime as needed for sleep.        No results found.  No results found.   ROS: 14 pt review of systems performed and negative (unless mentioned in an HPI)  Objective: BP 101/68 (BP Location: Left Arm, Patient Position: Sitting, Cuff Size: Normal)   Pulse 81   Temp (!) 97.5 F (36.4 C)   Wt 125 lb (56.7 kg)   LMP 04/22/2017   SpO2 99%   BMI 22.14 kg/m   Gen: Afebrile. No acute distress. Nontoxic in appearance, well developed. Well nourished.  HENT: AT. [place]. MMM.  Eyes:Pupils Equal Round Reactive to light, Extraocular movements intact,  Conjunctiva without redness, discharge or icterus. Neck/lymp/endocrine: Supple, no lymphadenopathy CV: RRR  Chest: CTAB, no wheeze or crackles Abd: Soft. NTND. BS present. .  Neuro:  Normal gait. PERLA. EOMi. Alert. Oriented x3  Assessment/plan: Vickie Bennett is a 41 y.o. female present for  Generalized anxiety disorder/tachycardia/insomnia - Stable. Doing well. Rarely using xanax.  Lorrin Mais and xanax refilled for 6 months.  - refills on metoprolol.  - [place] printed, reviewed and made part of her permanent  records today. Appropriate.  - Controlled substance contact signed   Influenza vaccine administered - Flu Vaccine QUAD 6+ mos PF IM (Fluarix Quad PF)  Neutropenia, unspecified type - CBC w/Diff - Mildly decreasing neutrophils  over last year. Will recheck today. - conformed no recent illness.   Nephrolithiasis H/o. She had a 4 mm stone last month. Took leftover flomax and passed stone.    Return in about 6 months (around 10/30/2017) for anxiety/tachycardia.  Electronically signed by: Howard Pouch, DO [place]

## 2017-05-01 NOTE — Patient Instructions (Signed)
I have refilled your medications.  I also called in flomax for you to have on hand for next kidney stone. Take naproxen with it as well.   We will recheck your White blood cells today.

## 2017-05-02 LAB — CBC WITH DIFFERENTIAL/PLATELET
Basophils Absolute: 50 cells/uL (ref 0–200)
Basophils Relative: 1.1 %
Eosinophils Absolute: 81 cells/uL (ref 15–500)
Eosinophils Relative: 1.8 %
HEMATOCRIT: 40.5 % (ref 35.0–45.0)
HEMOGLOBIN: 13.7 g/dL (ref 11.7–15.5)
LYMPHS ABS: 1890 {cells}/uL (ref 850–3900)
MCH: 31.2 pg (ref 27.0–33.0)
MCHC: 33.8 g/dL (ref 32.0–36.0)
MCV: 92.3 fL (ref 80.0–100.0)
MONOS PCT: 8 %
MPV: 12.8 fL — ABNORMAL HIGH (ref 7.5–12.5)
NEUTROS ABS: 2120 {cells}/uL (ref 1500–7800)
NEUTROS PCT: 47.1 %
Platelets: 162 10*3/uL (ref 140–400)
RBC: 4.39 10*6/uL (ref 3.80–5.10)
RDW: 11.7 % (ref 11.0–15.0)
Total Lymphocyte: 42 %
WBC mixed population: 360 cells/uL (ref 200–950)
WBC: 4.5 10*3/uL (ref 3.8–10.8)

## 2017-05-02 LAB — TIQ-NTM

## 2017-05-08 ENCOUNTER — Ambulatory Visit: Payer: BC Managed Care – PPO | Admitting: Family Medicine

## 2017-05-10 ENCOUNTER — Ambulatory Visit: Payer: BC Managed Care – PPO | Admitting: Family Medicine

## 2017-05-22 ENCOUNTER — Other Ambulatory Visit: Payer: Self-pay

## 2017-05-22 ENCOUNTER — Emergency Department (HOSPITAL_BASED_OUTPATIENT_CLINIC_OR_DEPARTMENT_OTHER): Payer: BC Managed Care – PPO

## 2017-05-22 ENCOUNTER — Emergency Department (HOSPITAL_BASED_OUTPATIENT_CLINIC_OR_DEPARTMENT_OTHER)
Admission: EM | Admit: 2017-05-22 | Discharge: 2017-05-22 | Disposition: A | Discharge status: Home / Self Care | Payer: BC Managed Care – PPO | Attending: Emergency Medicine | Admitting: Emergency Medicine

## 2017-05-22 DIAGNOSIS — Z79899 Other long term (current) drug therapy: Secondary | ICD-10-CM | POA: Insufficient documentation

## 2017-05-22 DIAGNOSIS — N200 Calculus of kidney: Secondary | ICD-10-CM | POA: Insufficient documentation

## 2017-05-22 DIAGNOSIS — Z87891 Personal history of nicotine dependence: Secondary | ICD-10-CM | POA: Diagnosis not present

## 2017-05-22 DIAGNOSIS — R1032 Left lower quadrant pain: Secondary | ICD-10-CM | POA: Diagnosis present

## 2017-05-22 LAB — BASIC METABOLIC PANEL
ANION GAP: 11 (ref 5–15)
BUN: 17 mg/dL (ref 6–20)
CO2: 19 mmol/L — ABNORMAL LOW (ref 22–32)
Calcium: 9.2 mg/dL (ref 8.9–10.3)
Chloride: 104 mmol/L (ref 101–111)
Creatinine, Ser: 1.44 mg/dL — ABNORMAL HIGH (ref 0.44–1.00)
GFR calc Af Amer: 52 mL/min — ABNORMAL LOW (ref >60)
GFR, EST NON AFRICAN AMERICAN: 45 mL/min — AB (ref >60)
GLUCOSE: 174 mg/dL — AB (ref 65–99)
POTASSIUM: 3.4 mmol/L — AB (ref 3.5–5.1)
Sodium: 134 mmol/L — ABNORMAL LOW (ref 135–145)

## 2017-05-22 LAB — URINALYSIS, ROUTINE W REFLEX MICROSCOPIC
Bilirubin Urine: NEGATIVE
Glucose, UA: NEGATIVE mg/dL
Hgb urine dipstick: NEGATIVE
Ketones, ur: >80 mg/dL — AB
Leukocytes, UA: NEGATIVE
Nitrite: NEGATIVE
Protein, ur: NEGATIVE mg/dL
Specific Gravity, Urine: >1.03 — ABNORMAL HIGH (ref 1.005–1.030)
pH: 5.5 (ref 5.0–8.0)

## 2017-05-22 LAB — CBC WITH DIFFERENTIAL/PLATELET
BASOS ABS: 0 10*3/uL (ref 0.0–0.1)
Basophils Relative: 0 %
Eosinophils Absolute: 0 10*3/uL (ref 0.0–0.7)
Eosinophils Relative: 0 %
HCT: 39.1 % (ref 36.0–46.0)
Hemoglobin: 12.9 g/dL (ref 12.0–15.0)
LYMPHS PCT: 8 %
Lymphs Abs: 0.9 10*3/uL (ref 0.7–4.0)
MCH: 31.4 pg (ref 26.0–34.0)
MCHC: 33 g/dL (ref 30.0–36.0)
MCV: 95.1 fL (ref 78.0–100.0)
Monocytes Absolute: 0.5 10*3/uL (ref 0.1–1.0)
Monocytes Relative: 5 %
NEUTROS PCT: 87 %
Neutro Abs: 10 10*3/uL — ABNORMAL HIGH (ref 1.7–7.7)
Platelets: 127 10*3/uL — ABNORMAL LOW (ref 150–400)
RBC: 4.11 MIL/uL (ref 3.87–5.11)
RDW: 11.8 % (ref 11.5–15.5)
WBC: 11.4 10*3/uL — ABNORMAL HIGH (ref 4.0–10.5)

## 2017-05-22 LAB — PREGNANCY, URINE: Preg Test, Ur: NEGATIVE

## 2017-05-22 MED ORDER — ALPRAZOLAM 0.5 MG PO TABS
0.2500 mg | ORAL_TABLET | Freq: Once | ORAL | Status: AC
  Administered 2017-05-22: 0.25 mg via ORAL
  Filled 2017-05-22: qty 1

## 2017-05-22 MED ORDER — HYDROMORPHONE HCL 1 MG/ML IJ SOLN
0.5000 mg | Freq: Once | INTRAMUSCULAR | Status: AC
  Administered 2017-05-22: 0.5 mg via INTRAVENOUS
  Filled 2017-05-22: qty 1

## 2017-05-22 MED ORDER — OXYCODONE-ACETAMINOPHEN 5-325 MG PO TABS: 1.0000 | ORAL_TABLET | Freq: Four times a day (QID) | ORAL | 0 refills | Status: DC | PRN

## 2017-05-22 MED ORDER — OXYCODONE-ACETAMINOPHEN 5-325 MG PO TABS
1.0000 | ORAL_TABLET | Freq: Once | ORAL | Status: AC
  Administered 2017-05-22: 1 via ORAL
  Filled 2017-05-22: qty 1

## 2017-05-22 MED ORDER — ONDANSETRON 4 MG PO TBDP: 4.0000 mg | ORAL_TABLET | Freq: Three times a day (TID) | ORAL | 0 refills | Status: DC | PRN

## 2017-05-22 MED ORDER — SODIUM CHLORIDE 0.9 % IV BOLUS (SEPSIS)
1000.0000 mL | Freq: Once | INTRAVENOUS | Status: AC
Start: 2017-05-22 — End: 2017-05-22
  Administered 2017-05-22: 1000 mL via INTRAVENOUS

## 2017-05-22 NOTE — ED Triage Notes (Addendum)
Patient reports left flank pain since 1200 today.  Denies N/V.  Denies hematuria.  Reports history of kidney stones.  Patient took flomax, dilaudid and ibuprofen when symptoms started.

## 2017-05-22 NOTE — Discharge Instructions (Signed)
Please read instructions below. Drink plenty of water. You can take Percocet every 6 hours as needed for pain. You can take Zofran every 6 hours as needed for nausea. Take flomax once per day for bladder spasm. Call your Urologist in the morning to schedule a close follow up appointment. Return to the ER for fever, chills, uncontrollable vomiting, or worsening symptoms.

## 2017-05-22 NOTE — ED Notes (Signed)
Patient transported to Ultrasound 

## 2017-05-22 NOTE — ED Provider Notes (Signed)
[facility] EMERGENCY DEPARTMENT Provider Note   CSN: [id number] Arrival date & time: 05/22/17  1736     History   Chief Complaint Chief Complaint  Patient presents with  . Flank Pain    HPI Vickie Bennett is a 42 y.o. female with past medical history of nephrolithiasis, here with acute onset of left flank pain that began earlier today. Patient states she took leftover Dilaudid, Flomax, and Zofran for symptoms with improvement.  Reports associated urinary urgency. No N/V in ED, hematuria, dysuria, vaginal bleeding or discharge, fever, abd pain.  The largest stone she has had is 4 mm, and she has passed all of her stones without difficulty.  She is followed by Dr. Karsten Ro with [facility]. Of note, patient reports she is feeling anxious and has not had her Xanax today.  States her heart rate increases when she feels anxious. The history is provided by the patient.    Past Medical History:  Diagnosis Date  . Anxiety   . ANXIETY DISORDER 04/21/2010  . Elevated BP 03/16/2012  . Hernia of anterior abdominal wall 06/20/2014  . HYPERLIPIDEMIA 04/21/2010  . HYPERLIPIDEMIA 04/21/2010   Qualifier: Diagnosis of  By: Charlett Blake MD, Erline Levine    . INSOMNIA 04/21/2010  . Plantar wart of right foot 10/07/2013  . Preventative health care 12/21/2013  . Renal calculi   . TACHYCARDIA 04/21/2010    Patient Active Problem List   Diagnosis Date Noted  . Nephrolithiasis 01/25/2016  . Elevated TSH 12/29/2014  . Hernia of anterior abdominal wall 06/20/2014  . Plantar fasciitis of left foot 06/20/2014  . Generalized anxiety disorder 04/21/2010  . Insomnia 04/21/2010  . Tachycardia 04/21/2010    Past Surgical History:  Procedure Laterality Date  . CERVICAL BIOPSY  W/ LOOP ELECTRODE EXCISION    . CESAREAN SECTION  2007  . COLPOSCOPY    . DILATION AND CURETTAGE OF UTERUS     incomplete miscarriage  . LEEP  2005  . MANDIBLE SURGERY     b/l TMJ w/screws in place @ age 52    OB History      Gravida Para Term Preterm AB Living   2 1 1  0 1 1   SAB TAB Ectopic Multiple Live Births   1 0 0 0 0       Home Medications    Prior to Admission medications   Medication Sig Start Date End Date Taking? Authorizing Provider  ALPRAZolam (XANAX) 0.25 MG tablet Take 1 tablet (0.25 mg total) by mouth at bedtime as needed for anxiety or sleep. 1/2 to 1 tab  For anxiety 05/01/17   Kuneff, Renee A, DO  desogestrel-ethinyl estradiol (VIORELE) 0.15-0.02/0.01 MG (21/5) tablet Take 1 tablet by mouth daily. 08/20/16   Nunzio Cobbs, MD  metoprolol succinate (TOPROL-XL) 25 MG 24 hr tablet TAKE 1 TABLET (25 MG TOTAL) BY MOUTH DAILY. 05/01/17   Kuneff, Renee A, DO  ondansetron (ZOFRAN ODT) 4 MG disintegrating tablet Take 1 tablet (4 mg total) by mouth every 8 (eight) hours as needed for nausea or vomiting. 05/22/17   Robinson, [place] N, PA-C  oxyCODONE-acetaminophen (PERCOCET/ROXICET) 5-325 MG tablet Take 1-2 tablets by mouth every 6 (six) hours as needed for severe pain. 05/22/17   Robinson, [place] N, PA-C  tamsulosin (FLOMAX) 0.4 MG CAPS capsule Take one capsule daily until stone passes. 05/01/17   Kuneff, Renee A, DO  zolpidem (AMBIEN) 10 MG tablet Take 1 tablet (10 mg total) by mouth at  bedtime as needed for sleep. 05/01/17   Raoul Pitch, Renee A, DO    Family History Family History  Problem Relation Age of Onset  . Hypertension Father   . Arthritis Father   . Migraines Mother   . Interstitial cystitis Mother   . Ulcers Maternal Grandmother        Peptic  . Anemia Maternal Grandmother   . Heart disease Maternal Grandfather   . Heart disease Paternal Grandfather   . Alzheimer's disease Paternal Grandmother   . Other Paternal Grandmother        Possible PE    Social History Social History   Tobacco Use  . Smoking status: Former Smoker    Last attempt to quit: 02/05/2009    Years since quitting: 8.2  . Smokeless tobacco: Never Used  Substance Use Topics  . Alcohol use: Yes     Alcohol/week: 0.0 oz    Comment: occasionaly/ very rare  . Drug use: No     Allergies   Sulfonamide derivatives   Review of Systems Review of Systems  Constitutional: Negative for fever.  Gastrointestinal: Negative for abdominal pain.  Genitourinary: Positive for flank pain and urgency. Negative for dysuria, frequency, vaginal bleeding and vaginal discharge.  All other systems reviewed and are negative.    Physical Exam Updated Vital Signs BP 103/60 (BP Location: Left Arm)   Pulse (!) 113   Temp 97.6 F (36.4 C) (Oral)   Resp 16   Ht 5\' 3"  (1.6 m)   Wt 54.4 kg (120 lb)   LMP 04/22/2017   SpO2 100%   BMI 21.26 kg/m   Physical Exam  Constitutional: She appears well-developed and well-nourished. No distress.  HENT:  Head: Normocephalic and atraumatic.  Mouth/Throat: Oropharynx is clear and moist.  Eyes: Conjunctivae are normal.  Cardiovascular: Regular rhythm, normal heart sounds and intact distal pulses.  Slightly tachycardic  Pulmonary/Chest: Effort normal and breath sounds normal. No respiratory distress.  Abdominal: Soft. Bowel sounds are normal. She exhibits no distension. There is no tenderness. There is CVA tenderness (left). There is no rebound and no guarding.  Neurological: She is alert.  Skin: Skin is warm.  Psychiatric: She has a normal mood and affect. Her behavior is normal.  Nursing note and vitals reviewed.    ED Treatments / Results  Labs (all labs ordered are listed, but only abnormal results are displayed) Labs Reviewed  URINALYSIS, ROUTINE W REFLEX MICROSCOPIC - Abnormal; Notable for the following components:      Result Value   Specific Gravity, Urine >1.030 (*)    Ketones, ur >80 (*)    All other components within normal limits  CBC WITH DIFFERENTIAL/PLATELET - Abnormal; Notable for the following components:   WBC 11.4 (*)    Platelets 127 (*)    Neutro Abs 10.0 (*)    All other components within normal limits  BASIC METABOLIC PANEL -  Abnormal; Notable for the following components:   Sodium 134 (*)    Potassium 3.4 (*)    CO2 19 (*)    Glucose, Bld 174 (*)    Creatinine, Ser 1.44 (*)    GFR calc non Af Amer 45 (*)    GFR calc Af Amer 52 (*)    All other components within normal limits  PREGNANCY, URINE    EKG  EKG Interpretation  Date/Time:  Wednesday May 22 2017 18:12:30 EST Ventricular Rate:  137 PR Interval:    QRS Duration: 90 QT Interval:  313 QTC  Calculation: 473 R Axis:   69 Text Interpretation:  Sinus tachycardia RSR' in V1 or V2, probably normal variant Minimal ST depression, diffuse leads No previous ECGs available Confirmed by Theotis Burrow [phone]) on 05/22/2017 6:31:00 PM       Radiology US Renal  Result Date: 05/22/2017 CLINICAL DATA:  Left flank pain EXAM: RENAL / URINARY TRACT ULTRASOUND COMPLETE COMPARISON:  None. FINDINGS: Right Kidney: Length: 9.9 cm. Echogenicity and renal cortical thickness are within normal limits. No mass, perinephric fluid, or hydronephrosis visualized. No sonographically demonstrable calculus or ureterectasis. Left Kidney: Length: 9.5 cm. Echogenicity and renal cortical thickness are within normal limits. No mass or perinephric fluid visualized. There is moderate hydronephrosis on the left. There are nonobstructing calculi in the left kidney, largest measuring 4 mm. There is no appreciable ureterectasis by ultrasound. Bladder: Appears normal for degree of bladder distention. There is no demonstrable flow of urine into the bladder on the left by color Doppler examination. Flow is seen on the right. Note that on the right, a 2 mm echogenic focus concerning for a small calculus is noted at the right ureterovesical junction. IMPRESSION: 1. Fullness of the left renal collecting system, a finding concerning for obstruction more distally. Note that by color Doppler examination, flow from the distal ureter is not seen in the bladder. 2.  Nonobstructing calculi in left kidney,  largest measuring 4 mm. 3. 2 mm suspected calculus right ureterovesical junction. Color flow is seen in this area indicating urine flow into the bladder on the right. No hydronephrosis evident on the right. 4.  Study otherwise unremarkable. Electronically Signed   By: Lowella Grip III M.D.   On: 05/22/2017 21:10    Procedures Procedures (including critical care time)  Medications Ordered in ED Medications  sodium chloride 0.9 % bolus 1,000 mL (0 mLs Intravenous Stopped 05/22/17 2016)  HYDROmorphone (DILAUDID) injection 0.5 mg (0.5 mg Intravenous Given 05/22/17 1938)  ALPRAZolam (XANAX) tablet 0.25 mg (0.25 mg Oral Given 05/22/17 1939)  HYDROmorphone (DILAUDID) injection 0.5 mg (0.5 mg Intravenous Given 05/22/17 2129)  oxyCODONE-acetaminophen (PERCOCET/ROXICET) 5-325 MG per tablet 1 tablet (1 tablet Oral Given 05/22/17 2231)     Initial Impression / Assessment and Plan / ED Course  I have reviewed the triage vital signs and the nursing notes.  Pertinent labs & imaging results that were available during my care of the patient were reviewed by me and considered in my medical decision making (see chart for details).  Clinical Course as of May 23 2319  Wed May 22, 2017  2134 Discussed pt with Dr. Alinda Money with [facility], who recommends pt call office in morning for follow up. Safe for discharge with symptomatic management.  [JR]    Clinical Course User Index [JR] Robinson, [place] N, PA-C    Pt with history of nephrolithiasis, presenting to the ED with acute onset of left flank pain.  CVA tenderness on exam.  Per chart review, CT stone to study done October 2017, showing multiple stones in bilateral kidneys.  Patient denies any nausea or vomiting today.  Labs obtained showing mild AK I with creatinine of 1.44.  UA without infection.  Labs otherwise unremarkable.  Renal ultrasound obtained to evaluate for hydronephrosis; showing moderate left-sided hydronephrosis.  Discussed with Dr. Alinda Money,  with [facility], who recommends patient is discharged with outpatient follow-up.  Patient is to call [facility] in the morning follow-up appointment.  Patient's pain and symptoms controlled adequately in the ED.   Tolerating p.o.  fluids prior to discharge.  Patient with tachycardia on arrival, improved with IV fluids and pain medication.  Afebrile, nontoxic-appearing.  Patient reports anxiety, and suspect the tachycardia may be due to this.  Treated with home dose of Xanax in the ED. BP at baseline per chart review of outpatient PCP visits. Safe for discharge w close urology followup.  Patient discussed with Dr. Rex Kras.  Discussed results, findings, treatment and follow up. Patient advised of return precautions. Patient verbalized understanding and agreed with plan.  Final Clinical Impressions(s) / ED Diagnoses   Final diagnoses:  Nephrolithiasis    ED Discharge Orders        Ordered    oxyCODONE-acetaminophen (PERCOCET/ROXICET) 5-325 MG tablet  Every 6 hours PRN     05/22/17 2214    ondansetron (ZOFRAN ODT) 4 MG disintegrating tablet  Every 8 hours PRN     05/22/17 2214       Robinson, [place] N, PA-C 05/22/17 2321    Little, Wenda Overland, MD 05/24/17 1455

## 2017-05-28 ENCOUNTER — Other Ambulatory Visit: Payer: Self-pay | Admitting: Urology

## 2017-05-28 ENCOUNTER — Encounter (HOSPITAL_BASED_OUTPATIENT_CLINIC_OR_DEPARTMENT_OTHER): Payer: Self-pay | Admitting: *Deleted

## 2017-05-28 NOTE — Progress Notes (Signed)
SPOKE W/ PT VIA PHONE FOR PRE-OP INTERVIEW.  BUT PT STATED THAT SHE JUST PASSED HER KIDNEY STONE.  ADVISED PT SHE NEEDS TO CALL DR OTTELIN'S OR SCHEDULER, (GAVE HER THE #) TO CANCEL.

## 2017-05-31 ENCOUNTER — Ambulatory Visit (HOSPITAL_BASED_OUTPATIENT_CLINIC_OR_DEPARTMENT_OTHER): Admit: 2017-05-31 | Payer: BC Managed Care – PPO | Admitting: Urology

## 2017-05-31 HISTORY — DX: Personal history of urinary calculi: Z87.442

## 2017-05-31 HISTORY — DX: Calculus of ureter: N20.1

## 2017-05-31 HISTORY — DX: Hyperlipidemia, unspecified: E78.5

## 2017-05-31 SURGERY — CYSTOSCOPY/URETEROSCOPY/HOLMIUM LASER/STENT PLACEMENT
Anesthesia: General | Laterality: Left

## 2017-08-22 ENCOUNTER — Other Ambulatory Visit: Payer: Self-pay

## 2017-08-22 ENCOUNTER — Encounter: Payer: Self-pay | Admitting: Obstetrics and Gynecology

## 2017-08-22 ENCOUNTER — Ambulatory Visit: Payer: BC Managed Care – PPO | Admitting: Obstetrics and Gynecology

## 2017-08-22 VITALS — BP 100/70 | HR 80 | Resp 16 | Ht 62.75 in | Wt 114.0 lb

## 2017-08-22 DIAGNOSIS — Z23 Encounter for immunization: Secondary | ICD-10-CM

## 2017-08-22 DIAGNOSIS — Z01419 Encounter for gynecological examination (general) (routine) without abnormal findings: Secondary | ICD-10-CM

## 2017-08-22 DIAGNOSIS — Z7189 Other specified counseling: Secondary | ICD-10-CM | POA: Diagnosis not present

## 2017-08-22 DIAGNOSIS — Z7185 Encounter for immunization safety counseling: Secondary | ICD-10-CM

## 2017-08-22 MED ORDER — DESOGESTREL-ETHINYL ESTRADIOL 0.15-0.02/0.01 MG (21/5) PO TABS: 1.0000 | ORAL_TABLET | Freq: Every day | ORAL | 3 refills | Status: DC

## 2017-08-22 NOTE — Progress Notes (Signed)
42 y.o. G36P1011 Married Caucasian female here for annual exam.    Menses are regular on OCPs.  Husband considering vasectomy.  Having renal stones.  Seeing [facility].   Labs with PCP.   PCP: Dr. Howard Pouch    Patient's last menstrual period was 08/05/2017.           Sexually active: Yes.    The current method of family planning is VIORELE.    Exercising: No.  The patient does not participate in regular exercise at present. Smoker:  no  Health Maintenance: Pap:  10/24/16 Pap and HR HPV negative; 06/2015 - Dr. Adella Nissen - [place] - normal per patient 12/24/14 w/ Dr. Raoul Pitch - pap negative;HR HPV not detected 12/16/13 - Dr. Raoul Pitch - Vaginal pap smear NEG History of abnormal Pap:  Yes, 2005 - Colpo/LEEP done.  LGSIL.  Margins negative.   All follow up paps normal. TDaP:  2012 Gardasil:   no HIV: negative in pregnancy Hep C: never Screening Labs: PCP   reports that she quit smoking about 8 years ago. She has never used smokeless tobacco. She reports that she drinks alcohol. She reports that she does not use drugs.  Past Medical History:  Diagnosis Date  . Anxiety   . Hernia of anterior abdominal wall 06/20/2014  . History of kidney stones   . Hyperlipidemia   . TACHYCARDIA   . Ureteral calculus, left     Past Surgical History:  Procedure Laterality Date  . CERVICAL BIOPSY  W/ LOOP ELECTRODE EXCISION  05-28-2003   dr Quincy Simmonds [facility]  . CESAREAN SECTION  10-12-2005  dr Bethann Punches [facility]  . DILATION AND CURETTAGE OF UTERUS  06-06-2004  dr aHarrington Challenger [facility]   w/ suction for missed ab  . MANDIBLE SURGERY Bilateral age 77   b/l TMJ w/screws in place    Current Outpatient Medications  Medication Sig Dispense Refill  . ALPRAZolam (XANAX) 0.25 MG tablet Take 1 tablet (0.25 mg total) by mouth at bedtime as needed for anxiety or sleep. 1/2 to 1 tab  For anxiety 90 tablet 1  . desogestrel-ethinyl estradiol (VIORELE) 0.15-0.02/0.01 MG (21/5) tablet Take 1 tablet by mouth daily. 3 Package 3   . metoprolol succinate (TOPROL-XL) 25 MG 24 hr tablet TAKE 1 TABLET (25 MG TOTAL) BY MOUTH DAILY. 90 tablet 1  . zolpidem (AMBIEN) 10 MG tablet Take 1 tablet (10 mg total) by mouth at bedtime as needed for sleep. 90 tablet 1  . ondansetron (ZOFRAN ODT) 4 MG disintegrating tablet Take 1 tablet (4 mg total) by mouth every 8 (eight) hours as needed for nausea or vomiting. (Patient not taking: Reported on 08/22/2017) 20 tablet 0  . tamsulosin (FLOMAX) 0.4 MG CAPS capsule Take one capsule daily until stone passes. (Patient not taking: Reported on 08/22/2017) 30 capsule 0   No current facility-administered medications for this visit.     Family History  Problem Relation Age of Onset  . Hypertension Father   . Arthritis Father   . Migraines Mother   . Interstitial cystitis Mother   . Ulcers Maternal Grandmother        Peptic  . Anemia Maternal Grandmother   . Heart disease Maternal Grandfather   . Heart disease Paternal Grandfather   . Alzheimer's disease Paternal Grandmother   . Other Paternal Grandmother        Possible PE    ROS:  Pertinent items are noted in HPI.  Otherwise, a comprehensive ROS was negative.  Exam:   BP 100/70 (BP Location: Right Arm, Patient Position: Sitting, Cuff Size: Normal)   Pulse 80   Resp 16   Ht 5' 2.75" (1.594 m)   Wt 114 lb (51.7 kg)   LMP 08/05/2017   BMI 20.36 kg/m     General appearance: alert, cooperative and appears stated age Head: Normocephalic, without obvious abnormality, atraumatic Neck: no adenopathy, supple, symmetrical, trachea midline and thyroid normal to inspection and palpation Lungs: clear to auscultation bilaterally Breasts: normal appearance, no masses or tenderness, No nipple retraction or dimpling, No nipple discharge or bleeding, No axillary or supraclavicular adenopathy Heart: regular rate and rhythm Abdomen: soft, non-tender; no masses, no organomegaly Extremities: extremities normal, atraumatic, no cyanosis or edema Skin:  Skin color, texture, turgor normal. No rashes or lesions.  Pigmented nevi of the abdomen /chest.  Lymph nodes: Cervical, supraclavicular, and axillary nodes normal. No abnormal inguinal nodes palpated Neurologic: Grossly normal  Pelvic: External genitalia:  no lesions              Urethra:  normal appearing urethra with no masses, tenderness or lesions              Bartholins and Skenes: normal                 Vagina: normal appearing vagina with normal color and discharge, no lesions              Cervix: no lesions              Pap taken: No. Bimanual Exam:  Uterus:  normal size, contour, position, consistency, mobility, non-tender              Adnexa: no mass, fullness, tenderness          Chaperone was present for exam.  Assessment:   Well woman visit with normal exam. Hx LEEP in 2005 - LGSIL. Cervical stenosis.  Hx elevated TSH level with normal free T3 fand free T4. Followed by PCP.  Pigmented nevi. Renal stones.  Plan: Mammogram screening discussed.  She will schedule after turning 42. Recommended self breast awareness. Pap and HR HPV not needed this year.  Guidelines for Calcium, Vitamin D, regular exercise program including cardiovascular and weight bearing exercise. Refill of OCPs for one year.  Start Gardasil series.  She will establish care with [facility] where her family receives care.  Follow up annually and prn.   After visit summary provided.

## 2017-08-22 NOTE — Patient Instructions (Signed)

## 2017-11-07 ENCOUNTER — Encounter: Payer: Self-pay | Admitting: Family Medicine

## 2017-11-07 ENCOUNTER — Ambulatory Visit (INDEPENDENT_AMBULATORY_CARE_PROVIDER_SITE_OTHER): Payer: BC Managed Care – PPO | Admitting: Family Medicine

## 2017-11-07 VITALS — BP 95/60 | HR 103 | Temp 98.0°F | Resp 20 | Ht 63.0 in | Wt 112.0 lb

## 2017-11-07 DIAGNOSIS — G4700 Insomnia, unspecified: Secondary | ICD-10-CM

## 2017-11-07 DIAGNOSIS — R000 Tachycardia, unspecified: Secondary | ICD-10-CM | POA: Diagnosis not present

## 2017-11-07 DIAGNOSIS — F411 Generalized anxiety disorder: Secondary | ICD-10-CM

## 2017-11-07 MED ORDER — ALPRAZOLAM 0.25 MG PO TABS: 0.2500 mg | ORAL_TABLET | Freq: Every evening | ORAL | 1 refills | Status: DC | PRN

## 2017-11-07 MED ORDER — METOPROLOL SUCCINATE ER 25 MG PO TB24: ORAL_TABLET | ORAL | 1 refills | Status: DC

## 2017-11-07 MED ORDER — ZOLPIDEM TARTRATE 10 MG PO TABS: 10.0000 mg | ORAL_TABLET | Freq: Every evening | ORAL | 5 refills | Status: DC | PRN

## 2017-11-07 NOTE — Progress Notes (Signed)
[name] Kitchenr     Patient ID: Vickie Bennett, female  DOB: 1976-02-11, 42 y.o.   MRN: [id number] Patient Care Team    Relationship Specialty Notifications Start End  Ma Hillock, DO PCP - General Family Medicine  12/24/14   Oliver Pila, MD Referring Physician Obstetrics and Gynecology  12/26/15     Subjective:  Vickie Bennett is a 42 y.o.  Female  present for follow up on anxiety.  Anxiety/insomnia/Tachycardia: Patient doing well with her anxiety. She uses ambien nightly. Xanax is used as needed for anxiety, she has noticed she uses more than prior, but still not daily. She had used lexapro in the past, and tapered off after last visit without difficulty. She has been unable to tolerate Zoloft in the past. She does have occasional tachycardia, mostly related to anxiety and metoprolol still controls this for her. She denies oversedation or nightmares with ambien.   Depression screen [facility] 2/9 12/26/2016 11/12/2016 12/26/2015  Decreased Interest 0 0 0  Down, Depressed, Hopeless 0 0 0  PHQ - 2 Score 0 0 0  Altered sleeping - 0 -  Tired, decreased energy - 0 -  Change in appetite - 0 -  Feeling bad or failure about yourself  - 0 -  Trouble concentrating - 0 -  Moving slowly or fidgety/restless - 0 -  Suicidal thoughts - 0 -  PHQ-9 Score - 0 -   GAD 7 : Generalized Anxiety Score 11/07/2017 05/01/2017 11/12/2016  Nervous, Anxious, on Edge 1 0 1  Control/stop worrying 3 3 1   Worry too much - different things 1 1 1   Trouble relaxing 0 0 0  Restless 0 0 0  Easily annoyed or irritable 2 3 1   Afraid - awful might happen 1 2 1   Total GAD 7 Score 8 9 5   Anxiety Difficulty Somewhat difficult Not difficult at all Not difficult at all      Past Medical History:  Diagnosis Date  . Anxiety   . Hernia of anterior abdominal wall 06/20/2014  . History of kidney stones   . Hyperlipidemia   . TACHYCARDIA   . Ureteral calculus, left    Allergies  Allergen Reactions  . Sulfonamide Derivatives  Nausea Only   Past Surgical History:  Procedure Laterality Date  . CERVICAL BIOPSY  W/ LOOP ELECTRODE EXCISION  05-28-2003   dr Quincy Simmonds [facility]  . CESAREAN SECTION  10-12-2005  dr Bethann Punches [facility]  . DILATION AND CURETTAGE OF UTERUS  06-06-2004  dr aHarrington Challenger [facility]   w/ suction for missed ab  . MANDIBLE SURGERY Bilateral age 67   b/l TMJ w/screws in place   Family History  Problem Relation Age of Onset  . Hypertension Father   . Arthritis Father   . Migraines Mother   . Interstitial cystitis Mother   . Ulcers Maternal Grandmother        Peptic  . Anemia Maternal Grandmother   . Heart disease Maternal Grandfather   . Heart disease Paternal Grandfather   . Alzheimer's disease Paternal Grandmother   . Other Paternal Grandmother        Possible PE   Social History   Socioeconomic History  . Marital status: Married    Spouse name: Not on file  . Number of children: Not on file  . Years of education: Not on file  . Highest education level: Not on file  Occupational History  . Not on file  Social Needs  . Emergency planning/management officer  strain: Not on file  . Food insecurity:    Worry: Not on file    Inability: Not on file  . Transportation needs:    Medical: Not on file    Non-medical: Not on file  Tobacco Use  . Smoking status: Former Smoker    Last attempt to quit: 02/05/2009    Years since quitting: 8.7  . Smokeless tobacco: Never Used  Substance and Sexual Activity  . Alcohol use: Yes    Alcohol/week: 0.0 oz    Comment: occasionaly  . Drug use: No  . Sexual activity: Yes    Partners: Male    Birth control/protection: Pill  Lifestyle  . Physical activity:    Days per week: Not on file    Minutes per session: Not on file  . Stress: Not on file  Relationships  . Social connections:    Talks on phone: Not on file    Gets together: Not on file    Attends religious service: Not on file    Active member of club or organization: Not on file    Attends meetings of clubs or organizations:  Not on file    Relationship status: Not on file  . Intimate partner violence:    Fear of current or ex partner: Not on file    Emotionally abused: Not on file    Physically abused: Not on file    Forced sexual activity: Not on file  Other Topics Concern  . Not on file  Social History Narrative  . Not on file   Allergies as of 11/07/2017      Reactions   Sulfonamide Derivatives Nausea Only      Medication List        Accurate as of 11/07/17  9:23 AM. Always use your most recent med list.          ALPRAZolam 0.25 MG tablet Commonly known as:  XANAX Take 1 tablet (0.25 mg total) by mouth at bedtime as needed for anxiety or sleep.   desogestrel-ethinyl estradiol 0.15-0.02/0.01 MG (21/5) tablet Commonly known as:  VIORELE Take 1 tablet by mouth daily.   metoprolol succinate 25 MG 24 hr tablet Commonly known as:  TOPROL-XL TAKE 1 TABLET (25 MG TOTAL) BY MOUTH DAILY.   tamsulosin 0.4 MG Caps capsule Commonly known as:  FLOMAX Take one capsule daily until stone passes.   zolpidem 10 MG tablet Commonly known as:  AMBIEN Take 1 tablet (10 mg total) by mouth at bedtime as needed for sleep.        No results found.  No results found.   ROS: 14 pt review of systems performed and negative (unless mentioned in an HPI)  Objective: BP 95/60 (BP Location: Right Arm, Patient Position: Sitting, Cuff Size: Normal)   Pulse (!) 103   Temp 98 F (36.7 C)   Resp 20   Ht 5\' 3"  (1.6 m)   Wt 112 lb (50.8 kg)   SpO2 100%   BMI 19.84 kg/m   Gen: Afebrile. No acute distress. Nontoxic in appearance, well developed. Pleasant female. HENT: AT. [place].  MMM.  Eyes:Pupils Equal Round Reactive to light, Extraocular movements intact,  Conjunctiva without redness, discharge or icterus. CV: RRR (not tachy during exam), no edema Chest: CTAB, no wheeze or crackles Abd: Soft. NTND. BS present. no Masses palpated.  Neuro:  Normal gait. PERLA. EOMi. Alert. Oriented x3 C Psych: Normal affect,  dress and demeanor. Normal speech. Normal thought content and judgment.  Assessment/plan: Vickie Bennett is a 42 y.o. female present for  Generalized anxiety disorder/tachycardia/insomnia - Stable.  Doing well. Xanax use is PRN, not usually daily, but she has noticed a mild increase in anxiety level and her HR increases at that time. HR usually around 60 at home when not anxious.  Lorrin Mais and xanax refilled for 6 months. She will monitor levels of anxiety and if feeling need will discuss SSRI start (not zoloft or lexapro)) on her upcoming CPE.  - refills on metoprolol.  - [place] printed, reviewed and made part of her permanent  records 11/07/17 - Controlled substance contact signed  - F/U 6 mos   Return in about 6 months (around 05/09/2018) for insomnia/anxiety.  Electronically signed by: Howard Pouch, DO [place]

## 2017-11-07 NOTE — Patient Instructions (Signed)
It was great to see you today.  I have refilled meds for 6 months.  If you feel you would benefit from a daily med, we can discuss on your upcoming physical.   Please help [place] help you:  We are honored you have chosen [facility] for your Primary Care home. Below you will find basic instructions that you may need to access in the future. Please help [place] help you by reading the instructions, which cover many of the frequent questions we experience.   Prescription refills and request:  -In order to allow more efficient response time, please call your pharmacy for all refills. They will forward the request electronically to [place]. This allows for the quickest possible response. Request left on a nurse line can take longer to refill, since these are checked as time allows between office patients and other phone calls.  - refill request can take up to 3-5 working days to complete.  - If request is sent electronically and request is appropiate, it is usually completed in 1-2 business days.  - all patients will need to be seen routinely for all chronic medical conditions requiring prescription medications (see follow-up below). If you are overdue for follow up on your condition, you will be asked to make an appointment and we will call in enough medication to cover you until your appointment (up to 30 days).  - all controlled substances will require a face to face visit to request/refill.  - if you desire your prescriptions to go through a new pharmacy, and have an active script at original pharmacy, you will need to call your pharmacy and have scripts transferred to new pharmacy. This is completed between the pharmacy locations and not by your provider.    Results: If any images or labs were ordered, it can take up to 1 week to get results depending on the test ordered and the lab/facility running and resulting the test. - Normal or stable results, which do not need further discussion, may be released to  your mychart immediately with attached note to you. A call may not be generated for normal results. Please make certain to sign up for mychart. If you have questions on how to activate your mychart you can call the front office.  - If your results need further discussion, our office will attempt to contact you via phone, and if unable to reach you after 2 attempts, we will release your abnormal result to your mychart with instructions.  - All results will be automatically released in mychart after 1 week.  - Your provider will provide you with explanation and instruction on all relevant material in your results. Please keep in mind, results and labs may appear confusing or abnormal to the untrained eye, but it does not mean they are actually abnormal for you personally. If you have any questions about your results that are not covered, or you desire more detailed explanation than what was provided, you should make an appointment with your provider to do so.   Our office handles many outgoing and incoming calls daily. If we have not contacted you within 1 week about your results, please check your mychart to see if there is a message first and if not, then contact our office.  In helping with this matter, you help decrease call volume, and therefore allow [place] to be able to respond to patients needs more efficiently.   Acute office visits (sick visit):  An acute visit is intended for  a new problem and are scheduled in shorter time slots to allow schedule openings for patients with new problems. This is the appropriate visit to discuss a new problem. Problems will not be addressed by phone call or Echart message. Appointment is needed if requesting treatment. In order to provide you with excellent quality medical care with proper time for you to explain your problem, have an exam and receive treatment with instructions, these appointments should be limited to one new problem per visit. If you experience a new  problem, in which you desire to be addressed, please make an acute office visit, we save openings on the schedule to accommodate you. Please do not save your new problem for any other type of visit, let us take care of it properly and quickly for you.   Follow up visits:  Depending on your condition(s) your provider will need to see you routinely in order to provide you with quality care and prescribe medication(s). Most chronic conditions (Example: hypertension, Diabetes, depression/anxiety... etc), require visits a couple times a year. Your provider will instruct you on proper follow up for your personal medical conditions and history. Please make certain to make follow up appointments for your condition as instructed. Failing to do so could result in lapse in your medication treatment/refills. If you request a refill, and are overdue to be seen on a condition, we will always provide you with a 30 day script (once) to allow you time to schedule.    Medicare wellness (well visit): - we have a wonderful Nurse Maudie Mercury), that will meet with you and provide you will yearly medicare wellness visits. These visits should occur yearly (can not be scheduled less than 1 calendar year apart) and cover preventive health, immunizations, advance directives and screenings you are entitled to yearly through your medicare benefits. Do not miss out on your entitled benefits, this is when medicare will pay for these benefits to be ordered for you.  These are strongly encouraged by your provider and is the appropriate type of visit to make certain you are up to date with all preventive health benefits. If you have not had your medicare wellness exam in the last 12 months, please make certain to schedule one by calling the office and schedule your medicare wellness with Maudie Mercury as soon as possible.   Yearly physical (well visit):  - Adults are recommended to be seen yearly for physicals. Check with your insurance and date of your  last physical, most insurances require one calendar year between physicals. Physicals include all preventive health topics, screenings, medical exam and labs that are appropriate for gender/age and history. You may have fasting labs needed at this visit. This is a well visit (not a sick visit), new problems should not be covered during this visit (see acute visit).  - Pediatric patients are seen more frequently when they are younger. Your provider will advise you on well child visit timing that is appropriate for your their age. - This is not a medicare wellness visit. Medicare wellness exams do not have an exam portion to the visit. Some medicare companies allow for a physical, some do not allow a yearly physical. If your medicare allows a yearly physical you can schedule the medicare wellness with our nurse Maudie Mercury and have your physical with your provider after, on the same day. Please check with insurance for your full benefits.   Late Policy/No Shows:  - all new patients should arrive 15-30 minutes earlier than appointment to  allow [place] time  to  obtain all personal demographics,  insurance information and for you to complete office paperwork. - All established patients should arrive 10-15 minutes earlier than appointment time to update all information and be checked in .  - In our best efforts to run on time, if you are late for your appointment you will be asked to either reschedule or if able, we will work you back into the schedule. There will be a wait time to work you back in the schedule,  depending on availability.  - If you are unable to make it to your appointment as scheduled, please call 24 hours ahead of time to allow [place] to fill the time slot with someone else who needs to be seen. If you do not cancel your appointment ahead of time, you may be charged a no show fee.

## 2017-11-18 ENCOUNTER — Other Ambulatory Visit: Payer: Self-pay | Admitting: Obstetrics and Gynecology

## 2017-11-18 DIAGNOSIS — Z1231 Encounter for screening mammogram for malignant neoplasm of breast: Secondary | ICD-10-CM

## 2017-12-05 ENCOUNTER — Ambulatory Visit
Admission: RE | Admit: 2017-12-05 | Discharge: 2017-12-05 | Disposition: A | Discharge status: Home / Self Care | Payer: BC Managed Care – PPO | Source: Ambulatory Visit | Attending: Obstetrics and Gynecology | Admitting: Obstetrics and Gynecology

## 2017-12-05 DIAGNOSIS — Z1231 Encounter for screening mammogram for malignant neoplasm of breast: Secondary | ICD-10-CM

## 2017-12-06 ENCOUNTER — Other Ambulatory Visit: Payer: Self-pay | Admitting: Obstetrics and Gynecology

## 2017-12-06 ENCOUNTER — Telehealth: Payer: Self-pay | Admitting: Obstetrics and Gynecology

## 2017-12-06 ENCOUNTER — Encounter: Payer: Self-pay | Admitting: Obstetrics and Gynecology

## 2017-12-06 DIAGNOSIS — R928 Other abnormal and inconclusive findings on diagnostic imaging of breast: Secondary | ICD-10-CM

## 2017-12-06 NOTE — Telephone Encounter (Signed)
Already signed and faxed.

## 2017-12-06 NOTE — Telephone Encounter (Signed)
Tanya from [facility] calling requiring orders to be signed by Dr. Quincy Simmonds for patient's 1:00 appointment this afternoon.

## 2017-12-06 NOTE — Telephone Encounter (Signed)
Spoke with Clarise Cruz at [facility], confirmed requested orders received.   Encounter closed.

## 2017-12-06 NOTE — Telephone Encounter (Signed)
Dr. Quincy Simmonds -see message below.

## 2017-12-09 ENCOUNTER — Ambulatory Visit
Admission: RE | Admit: 2017-12-09 | Discharge: 2017-12-09 | Disposition: A | Discharge status: Home / Self Care | Payer: BC Managed Care – PPO | Source: Ambulatory Visit | Attending: Obstetrics and Gynecology | Admitting: Obstetrics and Gynecology

## 2017-12-09 ENCOUNTER — Other Ambulatory Visit: Payer: BC Managed Care – PPO

## 2017-12-09 DIAGNOSIS — R928 Other abnormal and inconclusive findings on diagnostic imaging of breast: Secondary | ICD-10-CM

## 2017-12-16 ENCOUNTER — Other Ambulatory Visit: Payer: BC Managed Care – PPO

## 2017-12-27 ENCOUNTER — Encounter: Payer: Self-pay | Admitting: Family Medicine

## 2017-12-27 ENCOUNTER — Ambulatory Visit (INDEPENDENT_AMBULATORY_CARE_PROVIDER_SITE_OTHER): Payer: BC Managed Care – PPO | Admitting: Family Medicine

## 2017-12-27 ENCOUNTER — Telehealth: Payer: Self-pay | Admitting: Family Medicine

## 2017-12-27 VITALS — BP 117/80 | HR 110 | Temp 98.1°F | Resp 16 | Ht 63.0 in | Wt 114.0 lb

## 2017-12-27 DIAGNOSIS — Z0001 Encounter for general adult medical examination with abnormal findings: Secondary | ICD-10-CM | POA: Diagnosis not present

## 2017-12-27 DIAGNOSIS — Z131 Encounter for screening for diabetes mellitus: Secondary | ICD-10-CM

## 2017-12-27 DIAGNOSIS — Z79899 Other long term (current) drug therapy: Secondary | ICD-10-CM

## 2017-12-27 DIAGNOSIS — F411 Generalized anxiety disorder: Secondary | ICD-10-CM

## 2017-12-27 DIAGNOSIS — R7989 Other specified abnormal findings of blood chemistry: Secondary | ICD-10-CM

## 2017-12-27 DIAGNOSIS — F5105 Insomnia due to other mental disorder: Secondary | ICD-10-CM

## 2017-12-27 DIAGNOSIS — Z130 Encounter for screening for diseases of the blood and blood-forming organs and certain disorders involving the immune mechanism: Secondary | ICD-10-CM

## 2017-12-27 DIAGNOSIS — Z13220 Encounter for screening for lipoid disorders: Secondary | ICD-10-CM | POA: Diagnosis not present

## 2017-12-27 DIAGNOSIS — F99 Mental disorder, not otherwise specified: Secondary | ICD-10-CM

## 2017-12-27 LAB — LIPID PANEL
CHOLESTEROL: 157 mg/dL (ref 0–200)
HDL: 68.1 mg/dL (ref >39.00)
LDL CALC: 70 mg/dL (ref 0–99)
NonHDL: 89.19
Total CHOL/HDL Ratio: 2
Triglycerides: 94 mg/dL (ref 0.0–149.0)
VLDL: 18.8 mg/dL (ref 0.0–40.0)

## 2017-12-27 LAB — CBC WITH DIFFERENTIAL/PLATELET
BASOS PCT: 1.1 % (ref 0.0–3.0)
Basophils Absolute: 0 10*3/uL (ref 0.0–0.1)
EOS PCT: 1.8 % (ref 0.0–5.0)
Eosinophils Absolute: 0.1 10*3/uL (ref 0.0–0.7)
HCT: 43.6 % (ref 36.0–46.0)
Hemoglobin: 14.5 g/dL (ref 12.0–15.0)
LYMPHS ABS: 1.4 10*3/uL (ref 0.7–4.0)
Lymphocytes Relative: 37.8 % (ref 12.0–46.0)
MCHC: 33.3 g/dL (ref 30.0–36.0)
MCV: 95.6 fl (ref 78.0–100.0)
MONO ABS: 0.2 10*3/uL (ref 0.1–1.0)
MONOS PCT: 5.3 % (ref 3.0–12.0)
NEUTROS PCT: 54 % (ref 43.0–77.0)
Neutro Abs: 2 10*3/uL (ref 1.4–7.7)
Platelets: 156 10*3/uL (ref 150.0–400.0)
RBC: 4.56 Mil/uL (ref 3.87–5.11)
RDW: 12.3 % (ref 11.5–15.5)
WBC: 3.7 10*3/uL — ABNORMAL LOW (ref 4.0–10.5)

## 2017-12-27 LAB — COMPREHENSIVE METABOLIC PANEL
ALT: 13 U/L (ref 0–35)
AST: 13 U/L (ref 0–37)
Albumin: 4 g/dL (ref 3.5–5.2)
Alkaline Phosphatase: 47 U/L (ref 39–117)
BUN: 13 mg/dL (ref 6–23)
CHLORIDE: 107 meq/L (ref 96–112)
CO2: 28 mEq/L (ref 19–32)
Calcium: 9.3 mg/dL (ref 8.4–10.5)
Creatinine, Ser: 0.83 mg/dL (ref 0.40–1.20)
GFR: 80.89 mL/min (ref >60.00)
Glucose, Bld: 96 mg/dL (ref 70–99)
POTASSIUM: 4.4 meq/L (ref 3.5–5.1)
Sodium: 141 mEq/L (ref 135–145)
Total Bilirubin: 0.6 mg/dL (ref 0.2–1.2)
Total Protein: 6.4 g/dL (ref 6.0–8.3)

## 2017-12-27 LAB — TSH: TSH: 5 u[IU]/mL — ABNORMAL HIGH (ref 0.35–4.50)

## 2017-12-27 LAB — HEMOGLOBIN A1C: Hgb A1c MFr Bld: 5.4 % (ref 4.6–6.5)

## 2017-12-27 MED ORDER — ESCITALOPRAM OXALATE 10 MG PO TABS: 10.0000 mg | ORAL_TABLET | Freq: Every day | ORAL | 0 refills | Status: DC

## 2017-12-27 MED ORDER — TRAZODONE HCL 50 MG PO TABS: 25.0000 mg | ORAL_TABLET | Freq: Every evening | ORAL | 0 refills | Status: DC | PRN

## 2017-12-27 NOTE — Progress Notes (Signed)
Patient ID: Vickie Bennett, female  DOB: [birth year], 42 y.o.   MRN: [id number] Patient Care Team    Relationship Specialty Notifications Start End  Ma Hillock, DO PCP - General Family Medicine  12/24/14   Oliver Pila, MD Referring Physician Obstetrics and Gynecology  12/26/15     Chief Complaint  Patient presents with  . Annual Exam    Subjective:  Vickie Bennett is a 42 y.o.  Female  present for CPE. All past medical history, surgical history, allergies, family history, immunizations, medications and social history were updated in the electronic medical record today. All recent labs, ED visits and hospitalizations within the last year were reviewed.  Anxiety/insomnia: She uses ambien nightly. Xanax is used as needed for anxiety, she has noticed she uses more than prior at her last visit 2 months ago. She had used lexapro in the past, and tapered off. She denies Se to lexapro. She has been unable to tolerate Zoloft in the past. She does have occasional tachycardia, mostly related to anxiety and metoprolol still controls this for her, but has also noticed more elevations lately.  She denies oversedation or nightmares with Vickie Bennett, but would like to come off that medication if there is something better for her  Health maintenance: Updated 12/27/17 Colonoscopy: No fhx, screen at 52 Mammogram: completed 12/05/2017- ABNL left--> [place] resulted w/ benign simple cyst. Ordered by Dr. Quincy Simmonds (gyn). No Fhx. Getting better at [facility].  Cervical cancer screening: Abnl 2005, last pap:10/24/2016, results: Normal, neg co test completed by:Dr.  Quincy Simmonds Immunizations: tdap UTD 2012, Influenza UTD 2017(encouraged yearly), Infectious disease screening: HIV completed  DEXA: Never Assistive device: None Oxygen use: None Patient has a Dental home, with routine visits.  Hospitalizations/ED visits: None   Depression screen [facility] 2/9 12/27/2017 12/26/2016 11/12/2016 12/26/2015  Decreased Interest 0 0 0 0  Down,  Depressed, Hopeless 0 0 0 0  PHQ - 2 Score 0 0 0 0  Altered sleeping 0 - 0 -  Tired, decreased energy 0 - 0 -  Change in appetite 0 - 0 -  Feeling bad or failure about yourself  0 - 0 -  Trouble concentrating 0 - 0 -  Moving slowly or fidgety/restless 0 - 0 -  Suicidal thoughts 0 - 0 -  PHQ-9 Score 0 - 0 -  Difficult doing work/chores Not difficult at all - - -   GAD 7 : Generalized Anxiety Score 12/27/2017 11/07/2017 05/01/2017 11/12/2016  Nervous, Anxious, on Edge 2 1 0 1  Control/stop worrying '2 3 3 1  ' Worry too much - different things '2 1 1 1  ' Trouble relaxing 1 0 0 0  Restless 1 0 0 0  Easily annoyed or irritable '2 2 3 1  ' Afraid - awful might happen '2 1 2 1  ' Total GAD 7 Score '12 8 9 5  ' Anxiety Difficulty Somewhat difficult Somewhat difficult Not difficult at all Not difficult at all     Current Exercise Habits: The patient does not participate in regular exercise at present     Immunization History  Administered Date(s) Administered  . HPV 9-valent 08/22/2017  . Influenza Split 03/07/2011, 03/12/2012  . Influenza,inj,Quad PF,6+ Mos 05/04/2013, 03/02/2015, 05/01/2016, 05/01/2017  . Influenza-Unspecified 04/20/2014  . PPD Test 12/26/2015  . Tdap 03/07/2011     Past Medical History:  Diagnosis Date  . Anxiety   . Hernia of anterior abdominal wall 06/20/2014  . History of kidney stones   .  Hyperlipidemia   . TACHYCARDIA   . Ureteral calculus, left    Allergies  Allergen Reactions  . Sulfonamide Derivatives Nausea Only   Past Surgical History:  Procedure Laterality Date  . CERVICAL BIOPSY  W/ LOOP ELECTRODE EXCISION  05-28-2003   dr Quincy Simmonds [facility]  . CESAREAN SECTION  10-12-2005  dr Bethann Punches [facility]  . DILATION AND CURETTAGE OF UTERUS  06-06-2004  dr aHarrington [facility]   w/ suction for missed ab  . MANDIBLE SURGERY Bilateral age 27   b/l TMJ w/screws in place   Family History  Problem Relation Age of Onset  . Hypertension Father   . Arthritis Father   . Migraines Mother     . Interstitial cystitis Mother   . Ulcers Maternal Grandmother        Peptic  . Anemia Maternal Grandmother   . Heart disease Maternal Grandfather   . Heart disease Paternal Grandfather   . Alzheimer's disease Paternal Grandmother   . Other Paternal Grandmother        Possible PE   Social History   Socioeconomic History  . Marital status: Married    Spouse name: Not on file  . Number of children: Not on file  . Years of education: Not on file  . Highest education level: Not on file  Occupational History  . Not on file  Social Needs  . Financial resource strain: Not on file  . Food insecurity:    Worry: Not on file    Inability: Not on file  . Transportation needs:    Medical: Not on file    Non-medical: Not on file  Tobacco Use  . Smoking status: Former Smoker    Last attempt to quit: 02/05/2009    Years since quitting: 8.8  . Smokeless tobacco: Never Used  Substance and Sexual Activity  . Alcohol use: Yes    Alcohol/week: 0.0 standard drinks    Comment: occasionaly  . Drug use: No  . Sexual activity: Yes    Partners: Male    Birth control/protection: Pill  Lifestyle  . Physical activity:    Days per week: Not on file    Minutes per session: Not on file  . Stress: Not on file  Relationships  . Social connections:    Talks on phone: Not on file    Gets together: Not on file    Attends religious service: Not on file    Active member of club or organization: Not on file    Attends meetings of clubs or organizations: Not on file    Relationship status: Not on file  . Intimate partner violence:    Fear of current or ex partner: Not on file    Emotionally abused: Not on file    Physically abused: Not on file    Forced sexual activity: Not on file  Other Topics Concern  . Not on file  Social History Narrative  . Not on file   Allergies as of 12/27/2017      Reactions   Sulfonamide Derivatives Nausea Only      Medication List        Accurate as of 12/27/17   8:10 AM. Always use your most recent med list.          ALPRAZolam 0.25 MG tablet Commonly known as:  XANAX Take 1 tablet (0.25 mg total) by mouth at bedtime as needed for anxiety or sleep.   desogestrel-ethinyl estradiol 0.15-0.02/0.01 MG (21/5) tablet Commonly known  as:  KARIVA,AZURETTE,MIRCETTE Take 1 tablet by mouth daily.   metoprolol succinate 25 MG 24 hr tablet Commonly known as:  TOPROL-XL TAKE 1 TABLET (25 MG TOTAL) BY MOUTH DAILY.   tamsulosin 0.4 MG Caps capsule Commonly known as:  FLOMAX Take one capsule daily until stone passes.   zolpidem 10 MG tablet Commonly known as:  AMBIEN Take 1 tablet (10 mg total) by mouth at bedtime as needed for sleep.       All past medical history, surgical history, allergies, family history, immunizations andmedications were updated in the EMR today and reviewed under the history and medication portions of their EMR.     No results found for this or any previous visit (from the past 2160 hour(s)).  US Breast Ltd Uni Left Inc Axilla  Result Date: 12/09/2017 CLINICAL DATA:  42 year old patient recalled from recent baseline screening mammogram for evaluation of a possible mass in the left breast. EXAM: DIGITAL DIAGNOSTIC LEFT MAMMOGRAM WITH TOMO ULTRASOUND LEFT BREAST COMPARISON:  Baseline screening mammogram December 05, 2017 ACR Breast Density Category c: The breast tissue is heterogeneously dense, which may obscure small masses. FINDINGS: Focal spot compression views with tomography confirm a circumscribed oval mass in the medial retroareolar left breast. The mass measures approximately 0.8 cm. Targeted ultrasound is performed, showing a 0.7 x 0.9 x 0.4 cm simple cyst at 9 o'clock position retroareolar. There is no associated vascular flow. There is posterior acoustic enhancement. No suspicious mass is identified on ultrasound. IMPRESSION: 0.9 cm simple cyst in the retroareolar left breast. No evidence of malignancy. RECOMMENDATION: Screening  mammogram in one year.(Code:SM-B-01Y) I have discussed the findings and recommendations with the patient. Results were also provided in writing at the conclusion of the visit. If applicable, a reminder letter will be sent to the patient regarding the next appointment. BI-RADS CATEGORY  2: Benign. Electronically Signed   By: Curlene Dolphin M.D.   On: 12/09/2017 08:09   Mm Diag Breast Tomo Uni Left  Result Date: 12/09/2017 CLINICAL DATA:  42 year old patient recalled from recent baseline screening mammogram for evaluation of a possible mass in the left breast. EXAM: DIGITAL DIAGNOSTIC LEFT MAMMOGRAM WITH TOMO ULTRASOUND LEFT BREAST COMPARISON:  Baseline screening mammogram December 05, 2017 ACR Breast Density Category c: The breast tissue is heterogeneously dense, which may obscure small masses. FINDINGS: Focal spot compression views with tomography confirm a circumscribed oval mass in the medial retroareolar left breast. The mass measures approximately 0.8 cm. Targeted ultrasound is performed, showing a 0.7 x 0.9 x 0.4 cm simple cyst at 9 o'clock position retroareolar. There is no associated vascular flow. There is posterior acoustic enhancement. No suspicious mass is identified on ultrasound. IMPRESSION: 0.9 cm simple cyst in the retroareolar left breast. No evidence of malignancy. RECOMMENDATION: Screening mammogram in one year.(Code:SM-B-01Y) I have discussed the findings and recommendations with the patient. Results were also provided in writing at the conclusion of the visit. If applicable, a reminder letter will be sent to the patient regarding the next appointment. BI-RADS CATEGORY  2: Benign. Electronically Signed   By: Curlene Dolphin M.D.   On: 12/09/2017 08:09     ROS: 14 pt review of systems performed and negative (unless mentioned in an HPI)  Objective: BP 117/80 (BP Location: Left Arm, Patient Position: Sitting, Cuff Size: Normal)   Pulse (!) 110   Temp 98.1 F (36.7 C) (Oral)   Resp 16   Ht 5'  3" (1.6 m)   Wt 114 lb (51.7  kg)   SpO2 100%   BMI 20.19 kg/m  Gen: Afebrile. No acute distress. Nontoxic in appearance, well-developed, well-nourished,  Pleasant caucasian female.  HENT: AT. [place]. Bilateral TM visualized and normal in appearance, normal external auditory canal. MMM, no oral lesions, adequate dentition. Bilateral nares within normal limits. Throat without erythema, ulcerations or exudates. no Cough on exam, no hoarseness on exam. Eyes:Pupils Equal Round Reactive to light, Extraocular movements intact,  Conjunctiva without redness, discharge or icterus. Neck/lymp/endocrine: Supple,no lymphadenopathy, no thyromegaly CV: RRR no murmur, no edema, +2/4 P posterior tibialis pulses. no carotid bruits. No JVD. Chest: CTAB, no wheeze, rhonchi or crackles. normal Respiratory effort. good Air movement. Abd: Soft. flat. NTND. BS present. no Masses palpated. No hepatosplenomegaly. No rebound tenderness or guarding. Skin: no rashes, purpura or petechiae. Warm and well-perfused. Skin intact. Neuro/Msk:  Normal gait. PERLA. EOMi. Alert. Oriented x3.  Cranial nerves II through XII intact. Muscle strength 5/5 upper/lower extremity. DTRs equal bilaterally. Psych: Normal affect, dress and demeanor. Normal speech. Normal thought content and judgment.   No exam data present  Assessment/plan: POLINA BURMASTER is a 42 y.o. 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 Start trazodone. Tapering instructions provided in person and written on AVS. Pt reports understanding.  - Start lexapro 10 mg QD. Prescribed today.  - escitalopram (LEXAPRO) 10 MG tablet; Take 1 tablet (10 mg total) by mouth daily.  Dispense: 90 tablet; Refill: 0 -  traZODone (DESYREL) 50 MG tablet; Take 0.5-2 tablets (25-100 mg total) by mouth at bedtime as needed for sleep.  Dispense: 180 tablet; Refill: 0  Encounter for general adult medical examination with abnormal findings Patient was encouraged to exercise greater than 150 minutes a week. Patient was encouraged to choose a diet filled with fresh fruits and vegetables, and lean meats. AVS provided to patient today for education/recommendation on gender specific health and safety maintenance. Colonoscopy: No fhx, screen at 52 Mammogram: completed 12/05/2017- ABNL left--> [place] resulted w/ benign simple cyst. Ordered by Dr. Quincy Simmonds (gyn). No Fhx. Getting better at [facility].  Cervical cancer screening: last pap:10/24/2016, results: Normal, neg co test completed by:Dr.  Quincy Simmonds Immunizations: tdap UTD 2012, Influenza UTD 2017(encouraged yearly), Infectious disease screening: HIV completed    Return in about 1 year (around 12/28/2018) for CPE.  Electronically signed by: Howard Pouch, DO [place]

## 2017-12-27 NOTE — Patient Instructions (Addendum)
Anxiety:  Start lexapro daily.  Start trazodone (stop [place]), start 1/2 tab about 1 hour before bed, and if needed may increase by 1/2 tab every 4 nights up to 100 mg (2 tabs) until you find the right dose for you. The right dose will make you sleep well, but not have a "sleep hangover". F/U 3 months, sooner around 6-8 weeks if not seeing improvement.   Health Maintenance, Female Adopting a healthy lifestyle and getting preventive care can go a long way to promote health and wellness. Talk with your health care provider about what schedule of regular examinations is right for you. This is a good chance for you to check in with your provider about disease prevention and staying healthy. In between checkups, there are plenty of things you can do on your own. Experts have done a lot of research about which lifestyle changes and preventive measures are most likely to keep you healthy. Ask your health care provider for more information. Weight and diet Eat a healthy diet  Be sure to include plenty of vegetables, fruits, low-fat dairy products, and lean protein.  Do not eat a lot of foods high in solid fats, added sugars, or salt.  Get regular exercise. This is one of the most important things you can do for your health. ? Most adults should exercise for at least 150 minutes each week. The exercise should increase your heart rate and make you sweat (moderate-intensity exercise). ? Most adults should also do strengthening exercises at least twice a week. This is in addition to the moderate-intensity exercise.  Maintain a healthy weight  Body mass index (BMI) is a measurement that can be used to identify possible weight problems. It estimates body fat based on height and weight. Your health care provider can help determine your BMI and help you achieve or maintain a healthy weight.  For females 23 years of age and older: ? A BMI below 18.5 is considered underweight. ? A BMI of 18.5 to 24.9 is  normal. ? A BMI of 25 to 29.9 is considered overweight. ? A BMI of 30 and above is considered obese.  Watch levels of cholesterol and blood lipids  You should start having your blood tested for lipids and cholesterol at 40 years of age, then have this test every 5 years.  You may need to have your cholesterol levels checked more often if: ? Your lipid or cholesterol levels are high. ? You are older than 40 years of age. ? You are at high risk for heart disease.  Cancer screening Lung Cancer  Lung cancer screening is recommended for adults 59-78 years old who are at high risk for lung cancer because of a history of smoking.  A yearly low-dose CT scan of the lungs is recommended for people who: ? Currently smoke. ? Have quit within the past 15 years. ? Have at least a 30-pack-year history of smoking. A pack year is smoking an average of one pack of cigarettes a day for 1 year.  Yearly screening should continue until it has been 15 years since you quit.  Yearly screening should stop if you develop a health problem that would prevent you from having lung cancer treatment.  Breast Cancer  Practice breast self-awareness. This means understanding how your breasts normally appear and feel.  It also means doing regular breast self-exams. Let your health care provider know about any changes, no matter how small.  If you are in your 20s or 30s,  you should have a clinical breast exam (CBE) by a health care provider every 1-3 years as part of a regular health exam.  If you are 40 or older, have a CBE every year. Also consider having a breast X-ray (mammogram) every year.  If you have a family history of breast cancer, talk to your health care provider about genetic screening.  If you are at high risk for breast cancer, talk to your health care provider about having an MRI and a mammogram every year.  Breast cancer gene (BRCA) assessment is recommended for women who have family members  with BRCA-related cancers. BRCA-related cancers include: ? Breast. ? Ovarian. ? Tubal. ? Peritoneal cancers.  Results of the assessment will determine the need for genetic counseling and BRCA1 and BRCA2 testing.  Cervical Cancer Your health care provider may recommend that you be screened regularly for cancer of the pelvic organs (ovaries, uterus, and vagina). This screening involves a pelvic examination, including checking for microscopic changes to the surface of your cervix (Pap test). You may be encouraged to have this screening done every 3 years, beginning at age 21.  For women ages 30-65, health care providers may recommend pelvic exams and Pap testing every 3 years, or they may recommend the Pap and pelvic exam, combined with testing for human papilloma virus (HPV), every 5 years. Some types of HPV increase your risk of cervical cancer. Testing for HPV may also be done on women of any age with unclear Pap test results.  Other health care providers may not recommend any screening for nonpregnant women who are considered low risk for pelvic cancer and who do not have symptoms. Ask your health care provider if a screening pelvic exam is right for you.  If you have had past treatment for cervical cancer or a condition that could lead to cancer, you need Pap tests and screening for cancer for at least 20 years after your treatment. If Pap tests have been discontinued, your risk factors (such as having a new sexual partner) need to be reassessed to determine if screening should resume. Some women have medical problems that increase the chance of getting cervical cancer. In these cases, your health care provider may recommend more frequent screening and Pap tests.  Colorectal Cancer  This type of cancer can be detected and often prevented.  Routine colorectal cancer screening usually begins at 40 years of age and continues through 40 years of age.  Your health care provider may recommend  screening at an earlier age if you have risk factors for colon cancer.  Your health care provider may also recommend using home test kits to check for hidden blood in the stool.  A small camera at the end of a tube can be used to examine your colon directly (sigmoidoscopy or colonoscopy). This is done to check for the earliest forms of colorectal cancer.  Routine screening usually begins at age 50.  Direct examination of the colon should be repeated every 5-10 years through 40 years of age. However, you may need to be screened more often if early forms of precancerous polyps or small growths are found.  Skin Cancer  Check your skin from head to toe regularly.  Tell your health care provider about any new moles or changes in moles, especially if there is a change in a mole's shape or color.  Also tell your health care provider if you have a mole that is larger than the size of a pencil eraser.    Always use sunscreen. Apply sunscreen liberally and repeatedly throughout the day.  Protect yourself by wearing long sleeves, pants, a wide-brimmed hat, and sunglasses whenever you are outside.  Heart disease, diabetes, and high blood pressure  High blood pressure causes heart disease and increases the risk of stroke. High blood pressure is more likely to develop in: ? People who have blood pressure in the high end of the normal range (130-139/85-89 mm Hg). ? People who are overweight or obese. ? People who are African American.  If you are 31-63 years of age, have your blood pressure checked every 3-5 years. If you are 93 years of age or older, have your blood pressure checked every year. You should have your blood pressure measured twice-once when you are at a hospital or clinic, and once when you are not at a hospital or clinic. Record the average of the two measurements. To check your blood pressure when you are not at a hospital or clinic, you can use: ? An automated blood pressure machine at  a pharmacy. ? A home blood pressure monitor.  If you are between 80 years and 37 years old, ask your health care provider if you should take aspirin to prevent strokes.  Have regular diabetes screenings. This involves taking a blood sample to check your fasting blood sugar level. ? If you are at a normal weight and have a low risk for diabetes, have this test once every three years after 40 years of age. ? If you are overweight and have a high risk for diabetes, consider being tested at a younger age or more often. Preventing infection Hepatitis B  If you have a higher risk for hepatitis B, you should be screened for this virus. You are considered at high risk for hepatitis B if: ? You were born in a country where hepatitis B is common. Ask your health care provider which countries are considered high risk. ? Your parents were born in a high-risk country, and you have not been immunized against hepatitis B (hepatitis B vaccine). ? You have HIV or AIDS. ? You use needles to inject street drugs. ? You live with someone who has hepatitis B. ? You have had sex with someone who has hepatitis B. ? You get hemodialysis treatment. ? You take certain medicines for conditions, including cancer, organ transplantation, and autoimmune conditions.  Hepatitis C  Blood testing is recommended for: ? Everyone born from [birth year] through 1963. ? Anyone with known risk factors for hepatitis C.  Sexually transmitted infections (STIs)  You should be screened for sexually transmitted infections (STIs) including gonorrhea and chlamydia if: ? You are sexually active and are younger than 40 years of age. ? You are older than 40 years of age and your health care provider tells you that you are at risk for this type of infection. ? Your sexual activity has changed since you were last screened and you are at an increased risk for chlamydia or gonorrhea. Ask your health care provider if you are at risk.  If you do  not have HIV, but are at risk, it may be recommended that you take a prescription medicine daily to prevent HIV infection. This is called pre-exposure prophylaxis (PrEP). You are considered at risk if: ? You are sexually active and do not regularly use condoms or know the HIV status of your partner(s). ? You take drugs by injection. ? You are sexually active with a partner who has HIV.  Talk with  your health care provider about whether you are at high risk of being infected with HIV. If you choose to begin PrEP, you should first be tested for HIV. You should then be tested every 3 months for as long as you are taking PrEP. Pregnancy  If you are premenopausal and you may become pregnant, ask your health care provider about preconception counseling.  If you may become pregnant, take 400 to 800 micrograms (mcg) of folic acid every day.  If you want to prevent pregnancy, talk to your health care provider about birth control (contraception). Osteoporosis and menopause  Osteoporosis is a disease in which the bones lose minerals and strength with aging. This can result in serious bone fractures. Your risk for osteoporosis can be identified using a bone density scan.  If you are 42 years of age or older, or if you are at risk for osteoporosis and fractures, ask your health care provider if you should be screened.  Ask your health care provider whether you should take a calcium or vitamin D supplement to lower your risk for osteoporosis.  Menopause may have certain physical symptoms and risks.  Hormone replacement therapy may reduce some of these symptoms and risks. Talk to your health care provider about whether hormone replacement therapy is right for you. Follow these instructions at home:  Schedule regular health, dental, and eye exams.  Stay current with your immunizations.  Do not use any tobacco products including cigarettes, chewing tobacco, or electronic cigarettes.  If you are  pregnant, do not drink alcohol.  If you are breastfeeding, limit how much and how often you drink alcohol.  Limit alcohol intake to no more than 1 drink per day for nonpregnant women. One drink equals 12 ounces of beer, 5 ounces of wine, or 1 ounces of hard liquor.  Do not use street drugs.  Do not share needles.  Ask your health care provider for help if you need support or information about quitting drugs.  Tell your health care provider if you often feel depressed.  Tell your health care provider if you have ever been abused or do not feel safe at home. This information is not intended to replace advice given to you by your health care provider. Make sure you discuss any questions you have with your health care provider. Document Released: 11/20/2010 Document Revised: 10/13/2015 Document Reviewed: 02/08/2015 Elsevier Interactive Patient Education  [employer].

## 2017-12-27 NOTE — Telephone Encounter (Signed)
Please inform patient the following information: Her labs are all are normal with the exception of her TSH is mildly elevated, about the same amount of elevation she had 2 years ago that resolved.  We will repeat her levels on her next follow-up appointment for anxiety.

## 2017-12-30 NOTE — Telephone Encounter (Signed)
Spoke with patient reviewed lab results and instructions. Patient verbalized understanding. 

## 2018-01-02 ENCOUNTER — Encounter: Payer: Self-pay | Admitting: Family Medicine

## 2018-03-27 ENCOUNTER — Telehealth: Payer: Self-pay

## 2018-03-27 ENCOUNTER — Other Ambulatory Visit: Payer: Self-pay

## 2018-03-27 DIAGNOSIS — F5105 Insomnia due to other mental disorder: Secondary | ICD-10-CM

## 2018-03-27 DIAGNOSIS — F99 Mental disorder, not otherwise specified: Principal | ICD-10-CM

## 2018-03-27 DIAGNOSIS — F411 Generalized anxiety disorder: Secondary | ICD-10-CM

## 2018-03-27 MED ORDER — ESCITALOPRAM OXALATE 10 MG PO TABS: 10.0000 mg | ORAL_TABLET | Freq: Every day | ORAL | 0 refills | Status: DC

## 2018-03-27 NOTE — Telephone Encounter (Signed)
Per my last note: pt is due for follow up at this time. She also has an appt schedule next week. Please call pt and make sure she has enough med to get her through to her appt. We will refill completely at her appt.

## 2018-03-27 NOTE — Telephone Encounter (Signed)
Patient has follow up appointment 04/02/18. Partial refill sent until patient can be seen.

## 2018-03-27 NOTE — Telephone Encounter (Signed)
Patient notified and stated that she was at work and not sure if she will have enough or not to get her through to her appointment. Patient will call back if she needs medication before her appointment.

## 2018-04-02 ENCOUNTER — Encounter: Payer: Self-pay | Admitting: Family Medicine

## 2018-04-02 ENCOUNTER — Ambulatory Visit: Payer: BC Managed Care – PPO | Admitting: Family Medicine

## 2018-04-02 VITALS — BP 122/78 | HR 81 | Temp 98.2°F | Resp 20 | Ht 63.0 in | Wt 122.0 lb

## 2018-04-02 DIAGNOSIS — F5105 Insomnia due to other mental disorder: Secondary | ICD-10-CM

## 2018-04-02 DIAGNOSIS — F411 Generalized anxiety disorder: Secondary | ICD-10-CM

## 2018-04-02 DIAGNOSIS — Z23 Encounter for immunization: Secondary | ICD-10-CM

## 2018-04-02 DIAGNOSIS — G4700 Insomnia, unspecified: Secondary | ICD-10-CM | POA: Diagnosis not present

## 2018-04-02 DIAGNOSIS — R7989 Other specified abnormal findings of blood chemistry: Secondary | ICD-10-CM

## 2018-04-02 DIAGNOSIS — F99 Mental disorder, not otherwise specified: Secondary | ICD-10-CM

## 2018-04-02 MED ORDER — TRAZODONE HCL 100 MG PO TABS: 100.0000 mg | ORAL_TABLET | Freq: Every evening | ORAL | 1 refills | Status: DC | PRN

## 2018-04-02 MED ORDER — METOPROLOL SUCCINATE ER 25 MG PO TB24: ORAL_TABLET | ORAL | 1 refills | Status: DC

## 2018-04-02 MED ORDER — ESCITALOPRAM OXALATE 10 MG PO TABS: 10.0000 mg | ORAL_TABLET | Freq: Every day | ORAL | 1 refills | Status: DC

## 2018-04-02 NOTE — Patient Instructions (Addendum)
I have refilled your meds today.  F/U 6 mos    Hypothyroidism Hypothyroidism is a disorder of the thyroid. The thyroid is a large gland that is located in the lower front of the neck. The thyroid releases hormones that control how the body works. With hypothyroidism, the thyroid does not make enough of these hormones. What are the causes? Causes of hypothyroidism may include:  Viral infections.  Pregnancy.  Your own defense system (immune system) attacking your thyroid.  Certain medicines.  Birth defects.  Past radiation treatments to your head or neck.  Past treatment with radioactive iodine.  Past surgical removal of part or all of your thyroid.  Problems with the gland that is located in the center of your brain (pituitary).  What are the signs or symptoms? Signs and symptoms of hypothyroidism may include:  Feeling as though you have no energy (lethargy).  Inability to tolerate cold.  Weight gain that is not explained by a change in diet or exercise habits.  Dry skin.  Coarse hair.  Menstrual irregularity.  Slowing of thought processes.  Constipation.  Sadness or depression.  How is this diagnosed? Your health care provider may diagnose hypothyroidism with blood tests and ultrasound tests. How is this treated? Hypothyroidism is treated with medicine that replaces the hormones that your body does not make. After you begin treatment, it may take several weeks for symptoms to go away. Follow these instructions at home:  Take medicines only as directed by your health care provider.  If you start taking any new medicines, tell your health care provider.  Keep all follow-up visits as directed by your health care provider. This is important. As your condition improves, your dosage needs may change. You will need to have blood tests regularly so that your health care provider can watch your condition. Contact a health care provider if:  Your symptoms do not get  better with treatment.  You are taking thyroid replacement medicine and: ? You sweat excessively. ? You have tremors. ? You feel anxious. ? You lose weight rapidly. ? You cannot tolerate heat. ? You have emotional swings. ? You have diarrhea. ? You feel weak. Get help right away if:  You develop chest pain.  You develop an irregular heartbeat.  You develop a rapid heartbeat. This information is not intended to replace advice given to you by your health care provider. Make sure you discuss any questions you have with your health care provider. Document Released: 05/07/2005 Document Revised: 10/13/2015 Document Reviewed: 09/22/2013 Elsevier Interactive Patient Education  2018 [employer].

## 2018-04-02 NOTE — Progress Notes (Signed)
[name] Kitchenr     Patient ID: Vickie Bennett, female  DOB: 26-Mar-1976, 42 y.o.   MRN: [id number] Patient Care Team    Relationship Specialty Notifications Start End  Ma Hillock, DO PCP - General Family Medicine  12/24/14   Oliver Pila, MD Referring Physician Obstetrics and Gynecology  12/26/15     Subjective:  Vickie Bennett is a 42 y.o.  Female  present for follow up on anxiety.  Anxiety/insomnia:  Doing well on trazodone 100 mg QHS and lexapro 10 mg QD. She is rarely using xanax, she does not need refills on xanex today. Lexapro does cause sweating, but she is ok with it. Trazodone helps her fall asleep nicely, she does usually wake in the middle of the night to use the restroom and then has trouble falling back asleep. Prior note: She uses ambien nightly. Xanax is used as needed for anxiety, she has noticed she uses more than prior at her last visit 2 months ago. She had used lexapro in the past, and tapered off. She denies Se to lexapro. She has been unable to tolerate Zoloft in the past. She does have occasional tachycardia, mostly related to anxiety and metoprolol still controls this for her, but has also noticed more elevations lately.  She denies oversedation or nightmares with Lorrin Mais, but would like to come off that medication if there is something better for her   Depression screen [facility] 2/9 04/02/2018 12/27/2017 12/26/2016 11/12/2016 12/26/2015  Decreased Interest 0 0 0 0 0  Down, Depressed, Hopeless 0 0 0 0 0  PHQ - 2 Score 0 0 0 0 0  Altered sleeping - 0 - 0 -  Tired, decreased energy - 0 - 0 -  Change in appetite - 0 - 0 -  Feeling bad or failure about yourself  - 0 - 0 -  Trouble concentrating - 0 - 0 -  Moving slowly or fidgety/restless - 0 - 0 -  Suicidal thoughts - 0 - 0 -  PHQ-9 Score - 0 - 0 -  Difficult doing work/chores - Not difficult at all - - -   GAD 7 : Generalized Anxiety Score 04/02/2018 12/27/2017 11/07/2017 05/01/2017  Nervous, Anxious, on Edge 0 2 1 0    Control/stop worrying 0 2 3 3   Worry too much - different things 0 2 1 1   Trouble relaxing 0 1 0 0  Restless 0 1 0 0  Easily annoyed or irritable 0 2 2 3   Afraid - awful might happen 0 2 1 2   Total GAD 7 Score 0 12 8 9   Anxiety Difficulty Not difficult at all Somewhat difficult Somewhat difficult Not difficult at all    Past Medical History:  Diagnosis Date  . Anxiety   . Hernia of anterior abdominal wall 06/20/2014  . History of kidney stones   . Hyperlipidemia   . TACHYCARDIA   . Ureteral calculus, left    Allergies  Allergen Reactions  . Sulfonamide Derivatives Nausea Only   Past Surgical History:  Procedure Laterality Date  . CERVICAL BIOPSY  W/ LOOP ELECTRODE EXCISION  05-28-2003   dr Quincy Simmonds [facility]  . CESAREAN SECTION  10-12-2005  dr Bethann Punches [facility]  . DILATION AND CURETTAGE OF UTERUS  06-06-2004  dr aHarrington Challenger [facility]   w/ suction for missed ab  . MANDIBLE SURGERY Bilateral age 70   b/l TMJ w/screws in place   Family History  Problem Relation Age of Onset  . Hypertension Father   .  Arthritis Father   . Migraines Mother   . Interstitial cystitis Mother   . Ulcers Maternal Grandmother        Peptic  . Anemia Maternal Grandmother   . Heart disease Maternal Grandfather   . Heart disease Paternal Grandfather   . Alzheimer's disease Paternal Grandmother   . Other Paternal Grandmother        Possible PE   Social History   Socioeconomic History  . Marital status: Married    Spouse name: Not on file  . Number of children: Not on file  . Years of education: Not on file  . Highest education level: Not on file  Occupational History  . Not on file  Social Needs  . Financial resource strain: Not on file  . Food insecurity:    Worry: Not on file    Inability: Not on file  . Transportation needs:    Medical: Not on file    Non-medical: Not on file  Tobacco Use  . Smoking status: Former Smoker    Last attempt to quit: 02/05/2009    Years since quitting: 9.1  . Smokeless  tobacco: Never Used  Substance and Sexual Activity  . Alcohol use: Yes    Alcohol/week: 0.0 standard drinks    Comment: occasionaly  . Drug use: No  . Sexual activity: Yes    Partners: Male    Birth control/protection: Pill  Lifestyle  . Physical activity:    Days per week: Not on file    Minutes per session: Not on file  . Stress: Not on file  Relationships  . Social connections:    Talks on phone: Not on file    Gets together: Not on file    Attends religious service: Not on file    Active member of club or organization: Not on file    Attends meetings of clubs or organizations: Not on file    Relationship status: Not on file  . Intimate partner violence:    Fear of current or ex partner: Not on file    Emotionally abused: Not on file    Physically abused: Not on file    Forced sexual activity: Not on file  Other Topics Concern  . Not on file  Social History Narrative  . Not on file   Allergies as of 04/02/2018      Reactions   Sulfonamide Derivatives Nausea Only      Medication List        Accurate as of 04/02/18  5:14 PM. Always use your most recent med list.          ALPRAZolam 0.25 MG tablet Commonly known as:  XANAX Take 1 tablet (0.25 mg total) by mouth at bedtime as needed for anxiety or sleep.   desogestrel-ethinyl estradiol 0.15-0.02/0.01 MG (21/5) tablet Commonly known as:  KARIVA,AZURETTE,MIRCETTE Take 1 tablet by mouth daily.   escitalopram 10 MG tablet Commonly known as:  LEXAPRO Take 1 tablet (10 mg total) by mouth daily.   metoprolol succinate 25 MG 24 hr tablet Commonly known as:  TOPROL-XL TAKE 1 TABLET (25 MG TOTAL) BY MOUTH DAILY.   tamsulosin 0.4 MG Caps capsule Commonly known as:  FLOMAX Take one capsule daily until stone passes.   traZODone 100 MG tablet Commonly known as:  DESYREL Take 1-1.5 tablets (100-150 mg total) by mouth at bedtime as needed for sleep.      No results found.  No results found.   ROS: 14 pt  review of systems performed and negative (unless mentioned in an HPI)  Objective: BP 122/78 (BP Location: Right Arm, Patient Position: Sitting, Cuff Size: Normal)   Pulse 81   Temp 98.2 F (36.8 C)   Resp 20   Ht 5\' 3"  (1.6 m)   Wt 122 lb (55.3 kg)   SpO2 100%   BMI 21.61 kg/m   Gen: Afebrile. No acute distress. Nontoxic.  HENT: AT. [place]. MMM.  Eyes:Pupils Equal Round Reactive to light, Extraocular movements intact,  Conjunctiva without redness, discharge or icterus. Neck/lymp/endocrine: Supple, no thyromegaly CV: RRR no mrumru, no edema, +2/4 P posterior tibialis pulses Chest: CTAB, no wheeze or crackles Abd: Soft. NTND. BS present. no Masses palpated.  Skin: no rashes, purpura or petechiae.  Neuro:  Normal gait. PERLA. EOMi. Alert. Oriented. Psych: Normal affect, dress and demeanor. Normal speech. Normal thought content and judgment.   Assessment/plan: YOMAIRA SOLAR is a 42 y.o. 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 Refills today for trazodone 100-150 mg QHS  - Ding well on lexapro 10 mg qd, refills provided today, Tachycardia well controlled with low dose metoprolol 25 mg QD. Refills provided today - [employer], reviewed and made part of her permanent  records 04/02/18 - Controlled substance contact signed . She does not need refills on xanax- she reports she rarely uses.  - F/U 6 mos  Immunization due - Flu Vaccine QUAD 6+ mos PF IM (Fluarix Quad PF)  Elevated TSH Last collection elevated. She has been elevate din the past and it resolved. No symptoms. Educated on hypothyroid and abnormal thryoid function. Repeat level today with -ab..  - TSH - T4, free - Thyroid peroxidase antibody   Return in about 6 months (around 10/01/2018).  Electronically signed by: Howard Pouch, DO [place]

## 2018-04-03 LAB — TSH: TSH: 2.85 m[IU]/L

## 2018-04-03 LAB — THYROID PEROXIDASE ANTIBODY: THYROID PEROXIDASE ANTIBODY: 1 [IU]/mL (ref <9)

## 2018-04-03 LAB — T4, FREE: FREE T4: 1.1 ng/dL (ref 0.8–1.8)

## 2018-04-09 ENCOUNTER — Ambulatory Visit: Payer: BC Managed Care – PPO | Admitting: Family Medicine

## 2018-08-13 ENCOUNTER — Other Ambulatory Visit: Payer: Self-pay | Admitting: *Deleted

## 2018-08-13 MED ORDER — DESOGESTREL-ETHINYL ESTRADIOL 0.15-0.02/0.01 MG (21/5) PO TABS: 1.0000 | ORAL_TABLET | Freq: Every day | ORAL | 0 refills | Status: DC

## 2018-08-13 NOTE — Telephone Encounter (Signed)
Medication refill request: OCP Last AEX:  08/22/17 Dr. Quincy Simmonds  Next AEX: 09/22/18  Last MMG (if hormonal medication request): 12/09/17 [place] left BIRADS2:benign. Refill authorized: 08/22/17 #3packs/3R. Today #3packs/0R?

## 2018-08-19 ENCOUNTER — Encounter: Payer: Self-pay | Admitting: Family Medicine

## 2018-09-18 ENCOUNTER — Ambulatory Visit: Payer: BC Managed Care – PPO | Admitting: Obstetrics and Gynecology

## 2018-09-22 ENCOUNTER — Ambulatory Visit: Payer: BC Managed Care – PPO | Admitting: Obstetrics and Gynecology

## 2018-09-30 ENCOUNTER — Other Ambulatory Visit: Payer: Self-pay | Admitting: Family Medicine

## 2018-09-30 DIAGNOSIS — F5105 Insomnia due to other mental disorder: Secondary | ICD-10-CM

## 2018-09-30 DIAGNOSIS — F99 Mental disorder, not otherwise specified: Secondary | ICD-10-CM

## 2018-10-01 ENCOUNTER — Other Ambulatory Visit: Payer: Self-pay

## 2018-10-01 ENCOUNTER — Encounter: Payer: Self-pay | Admitting: Family Medicine

## 2018-10-01 ENCOUNTER — Ambulatory Visit (INDEPENDENT_AMBULATORY_CARE_PROVIDER_SITE_OTHER): Payer: BC Managed Care – PPO | Admitting: Family Medicine

## 2018-10-01 VITALS — Ht 63.0 in | Wt 124.0 lb

## 2018-10-01 DIAGNOSIS — F5105 Insomnia due to other mental disorder: Secondary | ICD-10-CM | POA: Diagnosis not present

## 2018-10-01 DIAGNOSIS — F411 Generalized anxiety disorder: Secondary | ICD-10-CM

## 2018-10-01 DIAGNOSIS — R000 Tachycardia, unspecified: Secondary | ICD-10-CM | POA: Diagnosis not present

## 2018-10-01 DIAGNOSIS — F99 Mental disorder, not otherwise specified: Secondary | ICD-10-CM | POA: Diagnosis not present

## 2018-10-01 MED ORDER — ALPRAZOLAM 0.25 MG PO TABS: 0.2500 mg | ORAL_TABLET | Freq: Every evening | ORAL | 1 refills | Status: DC | PRN

## 2018-10-01 MED ORDER — METOPROLOL SUCCINATE ER 25 MG PO TB24: ORAL_TABLET | ORAL | 1 refills | Status: DC

## 2018-10-01 MED ORDER — ESCITALOPRAM OXALATE 20 MG PO TABS: 20.0000 mg | ORAL_TABLET | Freq: Every day | ORAL | 1 refills | Status: DC

## 2018-10-01 MED ORDER — TRAZODONE HCL 100 MG PO TABS: 150.0000 mg | ORAL_TABLET | Freq: Every evening | ORAL | 1 refills | Status: DC | PRN

## 2018-10-01 NOTE — Progress Notes (Signed)
VIRTUAL VISIT VIA VIDEO  I connected with Vickie Bennett on 10/01/18 at  1:20 PM EDT by a video enabled telemedicine application and verified that I am speaking with the correct person using two identifiers. Location patient: Home Location provider: [facility], Office Persons participating in the virtual visit: Patient, Dr. Raoul Pitch and R.Baker, LPN  I discussed the limitations of evaluation and management by telemedicine and the availability of in person appointments. The patient expressed understanding and agreed to proceed.   SUBJECTIVE Chief Complaint  Patient presents with  . Anxiety    Pt needs refills on medications.   . Insomnia    Has not been sleeping well lately but thinks it is due to her schedule being changed due to COVID-19.     HPI:  Anxiety/insomnia/tachycardia:  Vickie Bennett is a 43 y.o. female presents today to to follow-up on her anxiety, insomnia and tachycardia.  She reports her tachycardia and palpitations are well controlled on metoprolol 25 mg daily.  She feels she has a little bit more anxiety than she has on prior visits.  She believes it is secondary to the coronavirus outbreak and change in her daily routine.  She is not sleeping as well.  She reports compliance with trazodone 150 mg nightly, Lexapro 10 mg daily and Xanax 0.25 mg daily as needed.  Reports using the Xanax once a day as needed more frequently a few times a week in comparison to prior where she rarely used medication. Prior note:  Doing well on trazodone 100 mg QHS and lexapro 10 mg QD. She is rarely using xanax, she does not need refills on xanex today. Lexapro does cause sweating, but she is ok with it. Trazodone helps her fall asleep nicely, she does usually wake in the middle of the night to use the restroom and then has trouble falling back asleep. Prior note: She uses ambien nightly. Xanax is used as needed for anxiety, she has noticed she uses more than priorat her last visit  2 months ago.She had used lexapro in the past, and tapered off.She denies Se to lexapro.She has been unable to tolerate Zoloft in the past. She does have occasional tachycardia, mostly related to anxiety and metoprolol still controls this for her, but has also noticed more elevations lately.She denies oversedation or nightmares with ambien, but would like to come off that medication if there is something better for her  ROS: See pertinent positives and negatives per HPI.  Patient Active Problem List   Diagnosis Date Noted  . Nephrolithiasis 01/25/2016  . Elevated TSH 12/29/2014  . Hernia of anterior abdominal wall 06/20/2014  . Plantar fasciitis of left foot 06/20/2014  . Generalized anxiety disorder 04/21/2010  . Insomnia 04/21/2010  . Tachycardia 04/21/2010    Social History   Tobacco Use  . Smoking status: Former Smoker    Last attempt to quit: 02/05/2009    Years since quitting: 9.6  . Smokeless tobacco: Never Used  Substance Use Topics  . Alcohol use: Yes    Alcohol/week: 0.0 standard drinks    Comment: occasionaly    Current Outpatient Medications:  .  ALPRAZolam (XANAX) 0.25 MG tablet, Take 1 tablet (0.25 mg total) by mouth at bedtime as needed for anxiety or sleep., Disp: 90 tablet, Rfl: 1 .  Apple Cider Vinegar 188 MG CAPS, Take 1 capsule by mouth daily., Disp: , Rfl:  .  desogestrel-ethinyl estradiol (VIORELE) 0.15-0.02/0.01 MG (21/5) tablet, Take 1 tablet by mouth daily.,  Disp: 3 Package, Rfl: 0 .  escitalopram (LEXAPRO) 10 MG tablet, Take 1 tablet (10 mg total) by mouth daily., Disp: 90 tablet, Rfl: 1 .  metoprolol succinate (TOPROL-XL) 25 MG 24 hr tablet, TAKE 1 TABLET (25 MG TOTAL) BY MOUTH DAILY., Disp: 90 tablet, Rfl: 1 .  tamsulosin (FLOMAX) 0.4 MG CAPS capsule, Take one capsule daily until stone passes., Disp: 30 capsule, Rfl: 0 .  traZODone (DESYREL) 100 MG tablet, Take 1-1.5 tablets (100-150 mg total) by mouth at bedtime as needed for sleep., Disp: 135 tablet,  Rfl: 1  Allergies  Allergen Reactions  . Sulfonamide Derivatives Nausea Only    OBJECTIVE: Ht 5\' 3"  (1.6 m)   Wt 124 lb (56.2 kg)   LMP 09/01/2018   BMI 21.97 kg/m  Gen: No acute distress. Nontoxic in appearance.  HENT: AT. [place].  MMM.  Eyes:Pupils Equal Round Reactive to light, Extraocular movements intact,  Conjunctiva without redness, discharge or icterus. Chest: Cough or shortness of breath not present Neuro:  Alert. Oriented x3  Psych: Normal affect, dress and demeanor. Normal speech. Normal thought content and judgment.  Depression screen [facility] 2/9 04/02/2018 12/27/2017 12/26/2016 11/12/2016 12/26/2015  Decreased Interest 0 0 0 0 0  Down, Depressed, Hopeless 0 0 0 0 0  PHQ - 2 Score 0 0 0 0 0  Altered sleeping - 0 - 0 -  Tired, decreased energy - 0 - 0 -  Change in appetite - 0 - 0 -  Feeling bad or failure about yourself  - 0 - 0 -  Trouble concentrating - 0 - 0 -  Moving slowly or fidgety/restless - 0 - 0 -  Suicidal thoughts - 0 - 0 -  PHQ-9 Score - 0 - 0 -  Difficult doing work/chores - Not difficult at all - - -   GAD 7 : Generalized Anxiety Score 10/01/2018 04/02/2018 12/27/2017 11/07/2017  Nervous, Anxious, on Edge 0 0 2 1  Control/stop worrying 0 0 2 3  Worry too much - different things 0 0 2 1  Trouble relaxing 2 0 1 0  Restless 0 0 1 0  Easily annoyed or irritable 1 0 2 2  Afraid - awful might happen 0 0 2 1  Total GAD 7 Score 3 0 12 8  Anxiety Difficulty Not difficult at all Not difficult at all Somewhat difficult Somewhat difficult     ASSESSMENT AND PLAN: Vickie Bennett is a 43 y.o. female present for  Tachycardia  - stable. Continue metoprolol 25 mg QD. refills provided.   Generalized anxiety disorder/insomnia - Increased anxiety. GAD assessment score increased from last visit as well.- with Coronavirus outbreak. Having more trouble sleeping.  - increase trazodone to 150-200 mg QHS. Refills provided.  - increase lexapro to 20 mg QD. Refills provided.  -  uses xanax a few times a week since outbreak, prior use was infrequent. Hopefully once increased doses of lexapro/trazodone are effective will able to return to rare use of xanax. Refills provided today - [place] printed, reviewed 10/01/18 - Controlled substance contact signed .  - F/U 6 mos   Howard Pouch, DO 10/01/2018

## 2018-10-03 ENCOUNTER — Other Ambulatory Visit: Payer: Self-pay | Admitting: Family Medicine

## 2018-10-03 DIAGNOSIS — F411 Generalized anxiety disorder: Secondary | ICD-10-CM

## 2018-10-15 ENCOUNTER — Other Ambulatory Visit: Payer: Self-pay | Admitting: Obstetrics and Gynecology

## 2018-10-15 DIAGNOSIS — Z1231 Encounter for screening mammogram for malignant neoplasm of breast: Secondary | ICD-10-CM

## 2018-11-03 ENCOUNTER — Encounter: Payer: Self-pay | Admitting: Family Medicine

## 2018-11-10 ENCOUNTER — Other Ambulatory Visit: Payer: Self-pay | Admitting: Obstetrics and Gynecology

## 2018-11-10 NOTE — Telephone Encounter (Signed)
Medication refill request: Vickie Bennett  Last AEX:  08/22/17 Next AEX: left message for patient to schedule AEX   Last MMG (if hormonal medication request): 12/05/17 Bi-rads 2 benign  Refill authorized: #[id number] with 0 rf

## 2018-12-08 ENCOUNTER — Other Ambulatory Visit: Payer: Self-pay

## 2018-12-08 ENCOUNTER — Ambulatory Visit
Admission: RE | Admit: 2018-12-08 | Discharge: 2018-12-08 | Disposition: A | Discharge status: Home / Self Care | Payer: BC Managed Care – PPO | Source: Ambulatory Visit | Attending: Obstetrics and Gynecology | Admitting: Obstetrics and Gynecology

## 2018-12-08 DIAGNOSIS — Z1231 Encounter for screening mammogram for malignant neoplasm of breast: Secondary | ICD-10-CM

## 2018-12-29 ENCOUNTER — Encounter: Payer: Self-pay | Admitting: Family Medicine

## 2018-12-29 ENCOUNTER — Other Ambulatory Visit: Payer: Self-pay

## 2018-12-29 ENCOUNTER — Ambulatory Visit (INDEPENDENT_AMBULATORY_CARE_PROVIDER_SITE_OTHER): Payer: BC Managed Care – PPO | Admitting: Family Medicine

## 2018-12-29 VITALS — BP 89/57 | HR 82 | Temp 98.3°F | Resp 17 | Ht 63.0 in | Wt 125.5 lb

## 2018-12-29 DIAGNOSIS — F411 Generalized anxiety disorder: Secondary | ICD-10-CM

## 2018-12-29 DIAGNOSIS — Z130 Encounter for screening for diseases of the blood and blood-forming organs and certain disorders involving the immune mechanism: Secondary | ICD-10-CM | POA: Diagnosis not present

## 2018-12-29 DIAGNOSIS — Z131 Encounter for screening for diabetes mellitus: Secondary | ICD-10-CM | POA: Diagnosis not present

## 2018-12-29 DIAGNOSIS — Z13220 Encounter for screening for lipoid disorders: Secondary | ICD-10-CM

## 2018-12-29 DIAGNOSIS — R000 Tachycardia, unspecified: Secondary | ICD-10-CM

## 2018-12-29 DIAGNOSIS — F5105 Insomnia due to other mental disorder: Secondary | ICD-10-CM

## 2018-12-29 DIAGNOSIS — Z0001 Encounter for general adult medical examination with abnormal findings: Secondary | ICD-10-CM

## 2018-12-29 DIAGNOSIS — G4700 Insomnia, unspecified: Secondary | ICD-10-CM | POA: Diagnosis not present

## 2018-12-29 DIAGNOSIS — R7989 Other specified abnormal findings of blood chemistry: Secondary | ICD-10-CM

## 2018-12-29 DIAGNOSIS — Z0000 Encounter for general adult medical examination without abnormal findings: Secondary | ICD-10-CM

## 2018-12-29 DIAGNOSIS — Z79899 Other long term (current) drug therapy: Secondary | ICD-10-CM

## 2018-12-29 DIAGNOSIS — F99 Mental disorder, not otherwise specified: Secondary | ICD-10-CM

## 2018-12-29 LAB — TSH: TSH: 4.5 u[IU]/mL (ref 0.35–4.50)

## 2018-12-29 LAB — COMPREHENSIVE METABOLIC PANEL
ALT: 15 U/L (ref 0–35)
AST: 14 U/L (ref 0–37)
Albumin: 4.2 g/dL (ref 3.5–5.2)
Alkaline Phosphatase: 49 U/L (ref 39–117)
BUN: 9 mg/dL (ref 6–23)
CO2: 27 mEq/L (ref 19–32)
Calcium: 9.3 mg/dL (ref 8.4–10.5)
Chloride: 105 mEq/L (ref 96–112)
Creatinine, Ser: 0.87 mg/dL (ref 0.40–1.20)
GFR: 71.72 mL/min (ref >60.00)
Glucose, Bld: 87 mg/dL (ref 70–99)
Potassium: 3.8 mEq/L (ref 3.5–5.1)
Sodium: 140 mEq/L (ref 135–145)
Total Bilirubin: 0.5 mg/dL (ref 0.2–1.2)
Total Protein: 6.1 g/dL (ref 6.0–8.3)

## 2018-12-29 LAB — CBC WITH DIFFERENTIAL/PLATELET
Basophils Absolute: 0 10*3/uL (ref 0.0–0.1)
Basophils Relative: 0.9 % (ref 0.0–3.0)
Eosinophils Absolute: 0.1 10*3/uL (ref 0.0–0.7)
Eosinophils Relative: 3.4 % (ref 0.0–5.0)
HCT: 41.4 % (ref 36.0–46.0)
Hemoglobin: 13.6 g/dL (ref 12.0–15.0)
Lymphocytes Relative: 34.1 % (ref 12.0–46.0)
Lymphs Abs: 1.4 10*3/uL (ref 0.7–4.0)
MCHC: 32.8 g/dL (ref 30.0–36.0)
MCV: 96.8 fl (ref 78.0–100.0)
Monocytes Absolute: 0.3 10*3/uL (ref 0.1–1.0)
Monocytes Relative: 7.3 % (ref 3.0–12.0)
Neutro Abs: 2.2 10*3/uL (ref 1.4–7.7)
Neutrophils Relative %: 54.3 % (ref 43.0–77.0)
Platelets: 148 10*3/uL — ABNORMAL LOW (ref 150.0–400.0)
RBC: 4.28 Mil/uL (ref 3.87–5.11)
RDW: 12.8 % (ref 11.5–15.5)
WBC: 4.1 10*3/uL (ref 4.0–10.5)

## 2018-12-29 LAB — LIPID PANEL
Cholesterol: 147 mg/dL (ref 0–200)
HDL: 76.9 mg/dL (ref >39.00)
LDL Cholesterol: 57 mg/dL (ref 0–99)
NonHDL: 70.04
Total CHOL/HDL Ratio: 2
Triglycerides: 64 mg/dL (ref 0.0–149.0)
VLDL: 12.8 mg/dL (ref 0.0–40.0)

## 2018-12-29 LAB — HEMOGLOBIN A1C: Hgb A1c MFr Bld: 5.2 % (ref 4.6–6.5)

## 2018-12-29 MED ORDER — METOPROLOL SUCCINATE ER 25 MG PO TB24: ORAL_TABLET | ORAL | 0 refills | Status: DC

## 2018-12-29 MED ORDER — ESCITALOPRAM OXALATE 20 MG PO TABS: 20.0000 mg | ORAL_TABLET | Freq: Every day | ORAL | 0 refills | Status: DC

## 2018-12-29 MED ORDER — TRAZODONE HCL 100 MG PO TABS: 250.0000 mg | ORAL_TABLET | Freq: Every evening | ORAL | 1 refills | Status: DC | PRN

## 2018-12-29 NOTE — Patient Instructions (Signed)
Health Maintenance, Female Adopting a healthy lifestyle and getting preventive care are important in promoting health and wellness. Ask your health care provider about:  The right schedule for you to have regular tests and exams.  Things you can do on your own to prevent diseases and keep yourself healthy. What should I know about diet, weight, and exercise? Eat a healthy diet   Eat a diet that includes plenty of vegetables, fruits, low-fat dairy products, and lean protein.  Do not eat a lot of foods that are high in solid fats, added sugars, or sodium. Maintain a healthy weight Body mass index (BMI) is used to identify weight problems. It estimates body fat based on height and weight. Your health care provider can help determine your BMI and help you achieve or maintain a healthy weight. Get regular exercise Get regular exercise. This is one of the most important things you can do for your health. Most adults should:  Exercise for at least 150 minutes each week. The exercise should increase your heart rate and make you sweat (moderate-intensity exercise).  Do strengthening exercises at least twice a week. This is in addition to the moderate-intensity exercise.  Spend less time sitting. Even light physical activity can be beneficial. Watch cholesterol and blood lipids Have your blood tested for lipids and cholesterol at 41 years of age, then have this test every 5 years. Have your cholesterol levels checked more often if:  Your lipid or cholesterol levels are high.  You are older than 40 years of age.  You are at high risk for heart disease. What should I know about cancer screening? Depending on your health history and family history, you may need to have cancer screening at various ages. This may include screening for:  Breast cancer.  Cervical cancer.  Colorectal cancer.  Skin cancer.  Lung cancer. What should I know about heart disease, diabetes, and high blood  pressure? Blood pressure and heart disease  High blood pressure causes heart disease and increases the risk of stroke. This is more likely to develop in people who have high blood pressure readings, are of African descent, or are overweight.  Have your blood pressure checked: ? Every 3-5 years if you are 18-39 years of age. ? Every year if you are 40 years old or older. Diabetes Have regular diabetes screenings. This checks your fasting blood sugar level. Have the screening done:  Once every three years after age 40 if you are at a normal weight and have a low risk for diabetes.  More often and at a younger age if you are overweight or have a high risk for diabetes. What should I know about preventing infection? Hepatitis B If you have a higher risk for hepatitis B, you should be screened for this virus. Talk with your health care provider to find out if you are at risk for hepatitis B infection. Hepatitis C Testing is recommended for:  Everyone born from 1945 through 1965.  Anyone with known risk factors for hepatitis C. Sexually transmitted infections (STIs)  Get screened for STIs, including gonorrhea and chlamydia, if: ? You are sexually active and are younger than 41 years of age. ? You are older than 41 years of age and your health care provider tells you that you are at risk for this type of infection. ? Your sexual activity has changed since you were last screened, and you are at increased risk for chlamydia or gonorrhea. Ask your health care provider if   you are at risk.  Ask your health care provider about whether you are at high risk for HIV. Your health care provider may recommend a prescription medicine to help prevent HIV infection. If you choose to take medicine to prevent HIV, you should first get tested for HIV. You should then be tested every 3 months for as long as you are taking the medicine. Pregnancy  If you are about to stop having your period (premenopausal) and  you may become pregnant, seek counseling before you get pregnant.  Take 400 to 800 micrograms (mcg) of folic acid every day if you become pregnant.  Ask for birth control (contraception) if you want to prevent pregnancy. Osteoporosis and menopause Osteoporosis is a disease in which the bones lose minerals and strength with aging. This can result in bone fractures. If you are 65 years old or older, or if you are at risk for osteoporosis and fractures, ask your health care provider if you should:  Be screened for bone loss.  Take a calcium or vitamin D supplement to lower your risk of fractures.  Be given hormone replacement therapy (HRT) to treat symptoms of menopause. Follow these instructions at home: Lifestyle  Do not use any products that contain nicotine or tobacco, such as cigarettes, e-cigarettes, and chewing tobacco. If you need help quitting, ask your health care provider.  Do not use street drugs.  Do not share needles.  Ask your health care provider for help if you need support or information about quitting drugs. Alcohol use  Do not drink alcohol if: ? Your health care provider tells you not to drink. ? You are pregnant, may be pregnant, or are planning to become pregnant.  If you drink alcohol: ? Limit how much you use to 0-1 drink a day. ? Limit intake if you are breastfeeding.  Be aware of how much alcohol is in your drink. In the [place] one drink equals one 12 oz bottle of beer (355 mL), one 5 oz glass of wine (148 mL), or one 1 oz glass of hard liquor (44 mL). General instructions  Schedule regular health, dental, and eye exams.  Stay current with your vaccines.  Tell your health care provider if: ? You often feel depressed. ? You have ever been abused or do not feel safe at home. Summary  Adopting a healthy lifestyle and getting preventive care are important in promoting health and wellness.  Follow your health care provider's instructions about healthy  diet, exercising, and getting tested or screened for diseases.  Follow your health care provider's instructions on monitoring your cholesterol and blood pressure. This information is not intended to replace advice given to you by your health care provider. Make sure you discuss any questions you have with your health care provider. Document Released: 11/20/2010 Document Revised: 04/30/2018 Document Reviewed: 04/30/2018 Elsevier Patient Education  2020 Elsevier Inc.  

## 2018-12-29 NOTE — Progress Notes (Signed)
Patient ID: Vickie Bennett, female  DOB: 1976/04/14, 43 y.o.   MRN: [id number] Patient Care Team    Relationship Specialty Notifications Start End  Ma Hillock, DO PCP - General Family Medicine  12/24/14   Oliver Pila, MD Referring Physician Obstetrics and Gynecology  12/26/15     Chief Complaint  Patient presents with  . Annual Exam    pap needs to schedule with OBGYN. mamm 12/08/18. fasting    Subjective:  Vickie Bennett is a 43 y.o.  Female  present for CPE. All past medical history, surgical history, allergies, family history, immunizations, medications and social history were updated in the electronic medical record today. All recent labs, ED visits and hospitalizations within the last year were reviewed.  Patient reports she has been doing well with her tachycardia.  She denies any symptoms of heart pounding, palpitations or dizziness.  She has had difficulty sleeping the last few weeks.  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  Up until this point the trazodone 2 tabs nightly, Lexapro daily and Xanax 0.25 mg had been working well.  Health maintenance: Colonoscopy: No fhx, screen at 52 Mammogram: completed 11/2018-- Getting better at [facility]. [facility] center Cervical cancer screening: last pap:10/24/2016, results: Normal,neg co testcompleted by:Dr.Silva Immunizations: tdap UTD 2012, Influenza (encouraged yearly), Infectious disease screening: HIV completed  DEXA: Never Assistive device: none Oxygen JYN:WGNF Patient has a Dental home. Hospitalizations/ED visits: reviewed   Depression screen [facility] 2/9 12/29/2018 04/02/2018 12/27/2017 12/26/2016 11/12/2016  Decreased Interest 0 0 0 0 0  Down, Depressed, Hopeless 0 0 0 0 0  PHQ - 2 Score 0 0 0 0 0  Altered sleeping - - 0 - 0  Tired, decreased energy - - 0 - 0  Change in appetite - - 0 - 0  Feeling bad or failure about yourself  - - 0 - 0  Trouble concentrating - - 0  - 0  Moving slowly or fidgety/restless - - 0 - 0  Suicidal thoughts - - 0 - 0  PHQ-9 Score - - 0 - 0  Difficult doing work/chores - - Not difficult at all - -   GAD 7 : Generalized Anxiety Score 12/29/2018 10/01/2018 04/02/2018 12/27/2017  Nervous, Anxious, on Edge 0 0 0 2  Control/stop worrying 1 0 0 2  Worry too much - different things 1 0 0 2  Trouble relaxing 0 2 0 1  Restless 0 0 0 1  Easily annoyed or irritable 1 1 0 2  Afraid - awful might happen 0 0 0 2  Total GAD 7 Score 3 3 0 12  Anxiety Difficulty Somewhat difficult Not difficult at all Not difficult at all Somewhat difficult     Immunization History  Administered Date(s) Administered  . HPV 9-valent 08/22/2017  . Influenza Split 03/07/2011, 03/12/2012  . Influenza,inj,Quad PF,6+ Mos 05/04/2013, 03/02/2015, 05/01/2016, 05/01/2017, 04/02/2018  . Influenza,inj,quad, With Preservative 03/01/2014  . Influenza-Unspecified 04/20/2014  . PPD Test 12/26/2015  . Tdap 03/07/2011     Past Medical History:  Diagnosis Date  . Anxiety   . Hernia of anterior abdominal wall 06/20/2014  . History of kidney stones   . Hyperlipidemia   . TACHYCARDIA   . Tachycardia 04/21/2010   Qualifier: Diagnosis of  By: Charlett Blake MD, Erline Levine    . Ureteral calculus, left    Allergies  Allergen Reactions  . Sulfonamide Derivatives Nausea Only  Past Surgical History:  Procedure Laterality Date  . CERVICAL BIOPSY  W/ LOOP ELECTRODE EXCISION  05-28-2003   dr Quincy Simmonds [facility]  . CESAREAN SECTION  10-12-2005  dr Bethann Punches [facility]  . DILATION AND CURETTAGE OF UTERUS  06-06-2004  dr aHarrington Challenger [facility]   w/ suction for missed ab  . MANDIBLE SURGERY Bilateral age 14   b/l TMJ w/screws in place   Family History  Problem Relation Age of Onset  . Hypertension Father   . Arthritis Father   . Migraines Mother   . Interstitial cystitis Mother   . Ulcers Maternal Grandmother        Peptic  . Anemia Maternal Grandmother   . Heart disease Maternal Grandfather   . Heart  disease Paternal Grandfather   . Alzheimer's disease Paternal Grandmother   . Other Paternal Grandmother        Possible PE   Social History   Social History Narrative  . Not on file    Allergies as of 12/29/2018      Reactions   Sulfonamide Derivatives Nausea Only      Medication List       Accurate as of December 29, 2018  9:45 AM. If you have any questions, ask your nurse or doctor.        STOP taking these medications   Kariva 0.15-0.02/0.01 MG (21/5) tablet Generic drug: desogestrel-ethinyl estradiol Stopped by: Howard Pouch, DO     TAKE these medications   ALPRAZolam 0.25 MG tablet Commonly known as: XANAX Take 1 tablet (0.25 mg total) by mouth at bedtime as needed for anxiety or sleep.   Apple Cider Vinegar 188 MG Caps Take 1 capsule by mouth daily.   escitalopram 20 MG tablet Commonly known as: LEXAPRO Take 1 tablet (20 mg total) by mouth daily.   metoprolol succinate 25 MG 24 hr tablet Commonly known as: TOPROL-XL TAKE 1 TABLET (25 MG TOTAL) BY MOUTH DAILY.   tamsulosin 0.4 MG Caps capsule Commonly known as: FLOMAX Take one capsule daily until stone passes.   traZODone 100 MG tablet Commonly known as: DESYREL Take 2.5-3 tablets (250-300 mg total) by mouth at bedtime as needed for sleep. What changed: how much to take Changed by: Howard Pouch, DO       All past medical history, surgical history, allergies, family history, immunizations andmedications were updated in the EMR today and reviewed under the history and medication portions of their EMR.     No results found for this or any previous visit (from the past 2160 hour(s)).  Mm 3d Screen Breast Bilateral  Result Date: 12/08/2018 CLINICAL DATA:  Screening. EXAM: DIGITAL SCREENING BILATERAL MAMMOGRAM WITH TOMO AND CAD COMPARISON:  Previous exam(s). ACR Breast Density Category c: The breast tissue is heterogeneously dense, which may obscure small masses. FINDINGS: There are no findings suspicious  for malignancy. Images were processed with CAD. IMPRESSION: No mammographic evidence of malignancy. A result letter of this screening mammogram will be mailed directly to the patient. RECOMMENDATION: Screening mammogram in one year. (Code:SM-B-01Y) BI-RADS CATEGORY  1: Negative. Electronically Signed   By: Curlene Dolphin M.D.   On: 12/08/2018 14:24    ROS: 14 pt review of systems performed and negative (unless mentioned in an HPI)  Objective: BP (!) 89/57 (BP Location: Right Arm, Patient Position: Sitting, Cuff Size: Normal)   Pulse 82   Temp 98.3 F (36.8 C) (Temporal)   Resp 17   Ht 5\' 3"  (1.6 m)  Wt 125 lb 8 oz (56.9 kg)   LMP 11/26/2018 (Exact Date)   SpO2 99%   BMI 22.23 kg/m  Gen: Afebrile. No acute distress. Nontoxic in appearance, well-developed, well-nourished,  Pleasant caucasian female.  HENT: AT. [place]. Bilateral TM visualized and normal in appearance, normal external auditory canal. MMM, no oral lesions, adequate dentition. Bilateral nares within normal limits. Throat without erythema, ulcerations or exudates. no Cough on exam, no hoarseness on exam. Eyes:Pupils Equal Round Reactive to light, Extraocular movements intact,  Conjunctiva without redness, discharge or icterus. Neck/lymp/endocrine: Supple,no lymphadenopathy, no thyromegaly CV: RRR no murmur, no edema, +2/4 P posterior tibialis pulses. no carotid bruits. No JVD. Chest: CTAB, no wheeze, rhonchi or crackles. Normal  Respiratory effort. good Air movement. Abd: Soft. flat. NTND. BS present. no Masses palpated. No hepatosplenomegaly. No rebound tenderness or guarding. Skin: no rashes, purpura or petechiae. Warm and well-perfused. Skin intact. Neuro/Msk:  Normal gait. PERLA. EOMi. Alert. Oriented x3.  Cranial nerves II through XII intact. Muscle strength 5/5 upper/lower extremity. DTRs equal bilaterally. Psych: Normal affect, dress and demeanor. Normal speech. Normal thought content and judgment.  No exam data present   Assessment/plan: Vickie Bennett is a 43 y.o. female present for CPE. Screening for deficiency anemia - CBC with Differential/Platelet Diabetes mellitus screening - Hemoglobin A1c Screening cholesterol level - Lipid panel Generalized anxiety disorder/insomnia/elevated TSH - continue lexapro and xanax as prescribed.  - increase trazodone 250-300 mg QHS - TSH - escitalopram (LEXAPRO) 20 MG tablet; Take 1 tablet (20 mg total) by mouth daily.  Dispense: 90 tablet; Refill: 0 - traZODone (DESYREL) 100 MG tablet; Take 2.5-3 tablets (250-300 mg total) by mouth at bedtime as needed for sleep.  Dispense: 270 tablet; Refill: 1  will need to maintain follow up 6 months from her prior anxiety appt for controlled substance refills.  Encounter for long-term current use of medication - Comprehensive metabolic panel Tachycardia Stable. Continue metoprolol. Refills provided today. BP low today, however she is asymptomatic and she had had similar pressures in the past. Will monitor.  Encounter for preventive health examination Patient was encouraged to exercise greater than 150 minutes a week. Patient was encouraged to choose a diet filled with fresh fruits and vegetables, and lean meats. AVS provided to patient today for education/recommendation on gender specific health and safety maintenance. Colonoscopy: No fhx, screen at 52 Mammogram: completed 11/2018-- Getting better at [facility]. Breast center Cervical cancer screening: last pap:10/24/2016, results: Normal,neg co testcompleted by:Dr.Silva Immunizations: tdap UTD 2012, Influenza (encouraged yearly), Infectious disease screening: HIV completed  DEXA: Never  Return in about 1 year (around 12/29/2019) for CPE (30 min). [place] appt due in November 2020.   Yearly physical completed today as well as additional 10 minutes spent face-to-face with patient discussing her increased anxiety/insomnia-along with medication changes.  Electronically signed by: Howard Pouch, DO [place]

## 2019-03-12 ENCOUNTER — Encounter: Payer: Self-pay | Admitting: Family Medicine

## 2019-03-12 NOTE — Telephone Encounter (Signed)
Pt sent my chart message about donating plasma and her HR being elevated and unable to donate. Pt was asked if she was taking her anxiety medication to help, she is and is not helping. Please advise.

## 2019-03-12 NOTE — Telephone Encounter (Signed)
Outside of anxiety reducing techniques there is not much more she could do.  Examples deep breathing, taking her anxiety and metoprolol medication before she goes (at least 2 hours prior), show up early so that she has time to attempt to decrease her anxiety before they check her.  Making sure she is well-hydrated so there is no other factors, such as dehydration, that cause her to have an increased pulse.

## 2019-06-29 ENCOUNTER — Other Ambulatory Visit: Payer: Self-pay

## 2019-06-29 ENCOUNTER — Encounter: Payer: Self-pay | Admitting: Family Medicine

## 2019-06-29 ENCOUNTER — Ambulatory Visit (INDEPENDENT_AMBULATORY_CARE_PROVIDER_SITE_OTHER): Payer: BC Managed Care – PPO | Admitting: Family Medicine

## 2019-06-29 VITALS — Temp 97.4°F | Ht 63.0 in | Wt 125.0 lb

## 2019-06-29 DIAGNOSIS — R000 Tachycardia, unspecified: Secondary | ICD-10-CM

## 2019-06-29 DIAGNOSIS — F5105 Insomnia due to other mental disorder: Secondary | ICD-10-CM

## 2019-06-29 DIAGNOSIS — F411 Generalized anxiety disorder: Secondary | ICD-10-CM | POA: Diagnosis not present

## 2019-06-29 DIAGNOSIS — G4700 Insomnia, unspecified: Secondary | ICD-10-CM | POA: Diagnosis not present

## 2019-06-29 DIAGNOSIS — F99 Mental disorder, not otherwise specified: Secondary | ICD-10-CM

## 2019-06-29 DIAGNOSIS — Z79899 Other long term (current) drug therapy: Secondary | ICD-10-CM

## 2019-06-29 MED ORDER — ALPRAZOLAM 0.25 MG PO TABS: 0.2500 mg | ORAL_TABLET | Freq: Every evening | ORAL | 1 refills | Status: DC | PRN

## 2019-06-29 MED ORDER — METOPROLOL SUCCINATE ER 25 MG PO TB24: ORAL_TABLET | ORAL | 1 refills | Status: DC

## 2019-06-29 MED ORDER — TRAZODONE HCL 300 MG PO TABS: 300.0000 mg | ORAL_TABLET | Freq: Every evening | ORAL | 1 refills | Status: DC | PRN

## 2019-06-29 MED ORDER — ESCITALOPRAM OXALATE 20 MG PO TABS: 20.0000 mg | ORAL_TABLET | Freq: Every day | ORAL | 1 refills | Status: DC

## 2019-06-29 NOTE — Progress Notes (Signed)
VIRTUAL VISIT VIA VIDEO  I connected with Vickie Bennett on 06/29/19 at  1:30 PM EST by a video enabled telemedicine application and verified that I am speaking with the correct person using two identifiers. Location patient: Home Location provider: [facility], Office Persons participating in the virtual visit: Patient, Dr. Raoul Pitch and R.Baker, LPN  I discussed the limitations of evaluation and management by telemedicine and the availability of in person appointments. The patient expressed understanding and agreed to proceed.  Patient ID: Vickie Bennett, female  DOB: Sep 13, 1975, 44 y.o.   MRN: [id number] Patient Care Team    Relationship Specialty Notifications Start End  Ma Hillock, DO PCP - General Family Medicine  12/24/14   Oliver Pila, MD Referring Physician Obstetrics and Gynecology  12/26/15     Chief Complaint  Patient presents with  . Anxiety    Pt needs refills on medications     Subjective: Vickie Bennett is a 44 y.o.  Female  present for  Anxiety/insomnia/tachycardia She reports her tachycardia and palpitations are well controlled on metoprolol 25 mg daily.   She reports compliance with trazodone 300 mg nightly, Lexapro 20 mg daily and Xanax 0.25 mg daily as needed.  Reports using the Xanax 1-3 times weekly.  Depression screen [facility] 2/9 06/29/2019 12/29/2018 04/02/2018 12/27/2017 12/26/2016  Decreased Interest 0 0 0 0 0  Down, Depressed, Hopeless 0 0 0 0 0  PHQ - 2 Score 0 0 0 0 0  Altered sleeping - - - 0 -  Tired, decreased energy - - - 0 -  Change in appetite - - - 0 -  Feeling bad or failure about yourself  - - - 0 -  Trouble concentrating - - - 0 -  Moving slowly or fidgety/restless - - - 0 -  Suicidal thoughts - - - 0 -  PHQ-9 Score - - - 0 -  Difficult doing work/chores - - - Not difficult at all -   GAD 7 : Generalized Anxiety Score 06/29/2019 12/29/2018 10/01/2018 04/02/2018  Nervous, Anxious, on Edge 0 0 0 0  Control/stop worrying 1 1 0  0  Worry too much - different things 0 1 0 0  Trouble relaxing 1 0 2 0  Restless 0 0 0 0  Easily annoyed or irritable 1 1 1  0  Afraid - awful might happen 0 0 0 0  Total GAD 7 Score 3 3 3  0  Anxiety Difficulty Not difficult at all Somewhat difficult Not difficult at all Not difficult at all     Immunization History  Administered Date(s) Administered  . HPV 9-valent 08/22/2017  . Influenza Split 03/07/2011, 03/12/2012  . Influenza,inj,Quad PF,6+ Mos 05/04/2013, 03/02/2015, 05/01/2016, 05/01/2017, 04/02/2018  . Influenza,inj,quad, With Preservative 03/01/2014  . Influenza-Unspecified 04/20/2014  . PPD Test 12/26/2015  . Tdap 03/07/2011     Past Medical History:  Diagnosis Date  . Anxiety   . Hernia of anterior abdominal wall 06/20/2014  . History of kidney stones   . Hyperlipidemia   . TACHYCARDIA   . Tachycardia 04/21/2010   Qualifier: Diagnosis of  By: Charlett Blake MD, Erline Levine    . Ureteral calculus, left    Allergies  Allergen Reactions  . Sulfonamide Derivatives Nausea Only   Past Surgical History:  Procedure Laterality Date  . CERVICAL BIOPSY  W/ LOOP ELECTRODE EXCISION  05-28-2003   dr Quincy Simmonds [facility]  . CESAREAN SECTION  10-12-2005  dr Bethann Punches [facility]  .  DILATION AND CURETTAGE OF UTERUS  06-06-2004  dr aHarrington Chall[facility]   w/ suction for missed ab  . MANDIBLE SURGERY Bilateral age 70   b/l TMJ w/screws in place   Family History  Problem Relation Age of Onset  . Hypertension Father   . Arthritis Father   . Migraines Mother   . Interstitial cystitis Mother   . Ulcers Maternal Grandmother        Peptic  . Anemia Maternal Grandmother   . Heart disease Maternal Grandfather   . Heart disease Paternal Grandfather   . Alzheimer's disease Paternal Grandmother   . Other Paternal Grandmother        Possible PE   Social History   Social History Narrative  . Not on file    Allergies as of 06/29/2019      Reactions   Sulfonamide Derivatives Nausea Only      Medication List        Accurate as of June 29, 2019  1:46 PM. If you have any questions, ask your nurse or doctor.        STOP taking these medications   Apple Cider Vinegar 188 MG Caps Stopped by: Howard Pouch, DO   tamsulosin 0.4 MG Caps capsule Commonly known as: FLOMAX Stopped by: Howard Pouch, DO     TAKE these medications   ALPRAZolam 0.25 MG tablet Commonly known as: XANAX Take 1 tablet (0.25 mg total) by mouth at bedtime as needed for anxiety or sleep.   escitalopram 20 MG tablet Commonly known as: LEXAPRO Take 1 tablet (20 mg total) by mouth daily.   metoprolol succinate 25 MG 24 hr tablet Commonly known as: TOPROL-XL TAKE 1 TABLET (25 MG TOTAL) BY MOUTH DAILY.   trazodone 300 MG tablet Commonly known as: DESYREL Take 1 tablet (300 mg total) by mouth at bedtime as needed for sleep. What changed:   medication strength  how much to take Changed by: Howard Pouch, DO       All past medical history, surgical history, allergies, family history, immunizations andmedications were updated in the EMR today and reviewed under the history and medication portions of their EMR.     No results found for this or any previous visit (from the past 2160 hour(s)).  ROS: 14 pt review of systems performed and negative (unless mentioned in an HPI)  Objective: Temp (!) 97.4 F (36.3 C) (Temporal)   Ht 5\' 3"  (1.6 m)   Wt 125 lb (56.7 kg)   BMI 22.14 kg/m  Gen: Afebrile. No acute distress.  HENT: AT. [place].  Eyes:Pupils Equal Round Reactive to light, Extraocular movements intact,  Conjunctiva without redness, discharge or icterus. Neuro:  Alert. Oriented x3 Psych: Normal affect, dress and demeanor. Normal speech. Normal thought content and judgment.   No exam data present  Assessment/plan: Vickie Bennett is a 44 y.o. female present for  Generalized anxiety disorder/insomnia/elevated TSH -Stable. - Continue Lexapro. -Continue Xanax as prescribed.  [place] controlled substance  database reviewed today and appropriate. -Continue trazodone 300 mg nightly. -Follow-up in 6 months for CPE  Tachycardia Stable.  Continue metoprolol 25 mg daily   Return in about 6 months (around 12/31/2019) for CPE (30 min). Meds ordered this encounter  Medications  . escitalopram (LEXAPRO) 20 MG tablet    Sig: Take 1 tablet (20 mg total) by mouth daily.    Dispense:  90 tablet    Refill:  1  . metoprolol succinate (TOPROL-XL) 25 MG 24  hr tablet    Sig: TAKE 1 TABLET (25 MG TOTAL) BY MOUTH DAILY.    Dispense:  90 tablet    Refill:  1  . traZODone (DESYREL) 300 MG tablet    Sig: Take 1 tablet (300 mg total) by mouth at bedtime as needed for sleep.    Dispense:  90 tablet    Refill:  1    Hold until pt request- please remind her to only take ONE now that dose changed per pill.  . ALPRAZolam (XANAX) 0.25 MG tablet    Sig: Take 1 tablet (0.25 mg total) by mouth at bedtime as needed for anxiety or sleep.    Dispense:  90 tablet    Refill:  1     Yearly physical completed today as well as additional 10 minutes spent face-to-face with patient discussing her increased anxiety/insomnia-along with medication changes.  Electronically signed by: Howard Pouch, DO [place]

## 2019-11-17 ENCOUNTER — Other Ambulatory Visit: Payer: Self-pay | Admitting: Obstetrics and Gynecology

## 2019-11-17 DIAGNOSIS — Z1231 Encounter for screening mammogram for malignant neoplasm of breast: Secondary | ICD-10-CM

## 2019-12-09 ENCOUNTER — Ambulatory Visit
Admission: RE | Admit: 2019-12-09 | Discharge: 2019-12-09 | Disposition: A | Discharge status: Home / Self Care | Payer: BC Managed Care – PPO | Source: Ambulatory Visit | Attending: Obstetrics and Gynecology | Admitting: Obstetrics and Gynecology

## 2019-12-09 ENCOUNTER — Other Ambulatory Visit: Payer: Self-pay

## 2019-12-09 DIAGNOSIS — Z1231 Encounter for screening mammogram for malignant neoplasm of breast: Secondary | ICD-10-CM

## 2020-01-01 ENCOUNTER — Encounter: Payer: Self-pay | Admitting: Family Medicine

## 2020-01-01 ENCOUNTER — Ambulatory Visit (INDEPENDENT_AMBULATORY_CARE_PROVIDER_SITE_OTHER): Payer: BC Managed Care – PPO | Admitting: Family Medicine

## 2020-01-01 ENCOUNTER — Other Ambulatory Visit: Payer: Self-pay

## 2020-01-01 VITALS — BP 111/72 | HR 92 | Ht 63.0 in | Wt 118.8 lb

## 2020-01-01 DIAGNOSIS — G4700 Insomnia, unspecified: Secondary | ICD-10-CM | POA: Diagnosis not present

## 2020-01-01 DIAGNOSIS — Z13220 Encounter for screening for lipoid disorders: Secondary | ICD-10-CM | POA: Diagnosis not present

## 2020-01-01 DIAGNOSIS — Z131 Encounter for screening for diabetes mellitus: Secondary | ICD-10-CM

## 2020-01-01 DIAGNOSIS — R7989 Other specified abnormal findings of blood chemistry: Secondary | ICD-10-CM

## 2020-01-01 DIAGNOSIS — Z79899 Other long term (current) drug therapy: Secondary | ICD-10-CM

## 2020-01-01 DIAGNOSIS — F411 Generalized anxiety disorder: Secondary | ICD-10-CM

## 2020-01-01 DIAGNOSIS — Z130 Encounter for screening for diseases of the blood and blood-forming organs and certain disorders involving the immune mechanism: Secondary | ICD-10-CM | POA: Diagnosis not present

## 2020-01-01 DIAGNOSIS — R000 Tachycardia, unspecified: Secondary | ICD-10-CM

## 2020-01-01 DIAGNOSIS — F5105 Insomnia due to other mental disorder: Secondary | ICD-10-CM

## 2020-01-01 DIAGNOSIS — F99 Mental disorder, not otherwise specified: Secondary | ICD-10-CM

## 2020-01-01 DIAGNOSIS — Z0000 Encounter for general adult medical examination without abnormal findings: Secondary | ICD-10-CM | POA: Diagnosis not present

## 2020-01-01 LAB — CBC WITH DIFFERENTIAL/PLATELET
Basophils Absolute: 0.1 10*3/uL (ref 0.0–0.1)
Basophils Relative: 1.5 % (ref 0.0–3.0)
Eosinophils Absolute: 0.1 10*3/uL (ref 0.0–0.7)
Eosinophils Relative: 1.5 % (ref 0.0–5.0)
HCT: 42.5 % (ref 36.0–46.0)
Hemoglobin: 14.1 g/dL (ref 12.0–15.0)
Lymphocytes Relative: 27.8 % (ref 12.0–46.0)
Lymphs Abs: 1.1 10*3/uL (ref 0.7–4.0)
MCHC: 33.2 g/dL (ref 30.0–36.0)
MCV: 97.3 fl (ref 78.0–100.0)
Monocytes Absolute: 0.3 10*3/uL (ref 0.1–1.0)
Monocytes Relative: 8.1 % (ref 3.0–12.0)
Neutro Abs: 2.4 10*3/uL (ref 1.4–7.7)
Neutrophils Relative %: 61.1 % (ref 43.0–77.0)
Platelets: 159 10*3/uL (ref 150.0–400.0)
RBC: 4.37 Mil/uL (ref 3.87–5.11)
RDW: 13.1 % (ref 11.5–15.5)
WBC: 4 10*3/uL (ref 4.0–10.5)

## 2020-01-01 LAB — COMPREHENSIVE METABOLIC PANEL
ALT: 12 U/L (ref 0–35)
AST: 16 U/L (ref 0–37)
Albumin: 4.2 g/dL (ref 3.5–5.2)
Alkaline Phosphatase: 62 U/L (ref 39–117)
BUN: 13 mg/dL (ref 6–23)
CO2: 28 mEq/L (ref 19–32)
Calcium: 9.1 mg/dL (ref 8.4–10.5)
Chloride: 105 mEq/L (ref 96–112)
Creatinine, Ser: 0.83 mg/dL (ref 0.40–1.20)
GFR: 75.35 mL/min (ref >60.00)
Glucose, Bld: 87 mg/dL (ref 70–99)
Potassium: 4.4 mEq/L (ref 3.5–5.1)
Sodium: 139 mEq/L (ref 135–145)
Total Bilirubin: 0.6 mg/dL (ref 0.2–1.2)
Total Protein: 6.1 g/dL (ref 6.0–8.3)

## 2020-01-01 LAB — TSH: TSH: 3.47 u[IU]/mL (ref 0.35–4.50)

## 2020-01-01 LAB — HEMOGLOBIN A1C: Hgb A1c MFr Bld: 5.3 % (ref 4.6–6.5)

## 2020-01-01 LAB — LIPID PANEL
Cholesterol: 146 mg/dL (ref 0–200)
HDL: 83.1 mg/dL (ref >39.00)
LDL Cholesterol: 53 mg/dL (ref 0–99)
NonHDL: 63.35
Total CHOL/HDL Ratio: 2
Triglycerides: 51 mg/dL (ref 0.0–149.0)
VLDL: 10.2 mg/dL (ref 0.0–40.0)

## 2020-01-01 LAB — T4, FREE: Free T4: 0.78 ng/dL (ref 0.60–1.60)

## 2020-01-01 MED ORDER — ALPRAZOLAM 0.25 MG PO TABS: 0.2500 mg | ORAL_TABLET | Freq: Every evening | ORAL | 1 refills | Status: DC | PRN

## 2020-01-01 MED ORDER — TRAZODONE HCL 300 MG PO TABS: 300.0000 mg | ORAL_TABLET | Freq: Every evening | ORAL | 1 refills | Status: DC | PRN

## 2020-01-01 MED ORDER — METOPROLOL SUCCINATE ER 25 MG PO TB24: ORAL_TABLET | ORAL | 1 refills | Status: DC

## 2020-01-01 MED ORDER — ESCITALOPRAM OXALATE 20 MG PO TABS: 20.0000 mg | ORAL_TABLET | Freq: Every day | ORAL | 1 refills | Status: DC

## 2020-01-01 NOTE — Progress Notes (Signed)
This visit occurred during the SARS-CoV-2 public health emergency.  Safety protocols were in place, including screening questions prior to the visit, additional usage of staff PPE, and extensive cleaning of exam room while observing appropriate contact time as indicated for disinfecting solutions.    Patient ID: Vickie Bennett, female  DOB: [birth year], 44 y.o.   MRN: [id number] Patient Care Team    Relationship Specialty Notifications Start End  Ma Hillock, DO PCP - General Family Medicine  12/24/14   Oliver Pila, MD Referring Physician Obstetrics and Gynecology  12/26/15     Chief Complaint  Patient presents with  . Annual Exam    Subjective:  Vickie Bennett is a 44 y.o.  Female  present for CPE. All past medical history, surgical history, allergies, family history, immunizations, medications and social history were updated in the electronic medical record today. All recent labs, ED visits and hospitalizations within the last year were reviewed.  Health maintenance:  Colonoscopy: No fhx, screen at 47 Mammogram:completed 11/2019-- [facility] Cervical cancer screening: last pap:10/24/2016, results: Normal,neg co testcompleted by:Dr.Silva Immunizations: tdap UTD 2012, Influenza (encouraged yearly),COVID counseled.  Infectious disease screening: HIV completed  DEXA: per routine screen Assistive device: none Oxygen KGM:WNUU Patient has a Dental home. Hospitalizations/ED visits: reviewed  Anxiety/insomnia/tachycardia She reports her tachycardia and palpitations are well controlled on metoprolol 25 mg daily. She reports compliance with trazodone 300 mg nightly, Lexapro 20 mg daily and Xanax 0.25 mgdaily as needed. Reports using the Xanax 2x times weekly.  Depression screen [facility] 2/9 01/01/2020 06/29/2019 12/29/2018 04/02/2018 12/27/2017  Decreased Interest 0 0 0 0 0  Down, Depressed, Hopeless 0 0 0 0 0  PHQ - 2 Score 0 0 0 0 0  Altered sleeping  - - - - 0  Tired, decreased energy - - - - 0  Change in appetite - - - - 0  Feeling bad or failure about yourself  - - - - 0  Trouble concentrating - - - - 0  Moving slowly or fidgety/restless - - - - 0  Suicidal thoughts - - - - 0  PHQ-9 Score - - - - 0  Difficult doing work/chores - - - - Not difficult at all   GAD 7 : Generalized Anxiety Score 06/29/2019 12/29/2018 10/01/2018 04/02/2018  Nervous, Anxious, on Edge 0 0 0 0  Control/stop worrying 1 1 0 0  Worry too much - different things 0 1 0 0  Trouble relaxing 1 0 2 0  Restless 0 0 0 0  Easily annoyed or irritable 1 1 1  0  Afraid - awful might happen 0 0 0 0  Total GAD 7 Score 3 3 3  0  Anxiety Difficulty Not difficult at all Somewhat difficult Not difficult at all Not difficult at all    Immunization History  Administered Date(s) Administered  . HPV 9-valent 08/22/2017  . Influenza Split 03/07/2011, 03/12/2012  . Influenza,inj,Quad PF,6+ Mos 05/04/2013, 03/02/2015, 05/01/2016, 05/01/2017, 04/02/2018  . Influenza,inj,quad, With Preservative 03/01/2014  . Influenza-Unspecified 04/20/2014  . PPD Test 12/26/2015  . Tdap 03/07/2011   Past Medical History:  Diagnosis Date  . Anxiety   . Hernia of anterior abdominal wall 06/20/2014  . History of kidney stones   . Hyperlipidemia   . TACHYCARDIA   . Tachycardia 04/21/2010   Qualifier: Diagnosis of  By: Charlett Blake MD, Erline Levine    . Ureteral calculus, left    Allergies  Allergen Reactions  . Sulfonamide Derivatives  Nausea Only   Past Surgical History:  Procedure Laterality Date  . CERVICAL BIOPSY  W/ LOOP ELECTRODE EXCISION  05-28-2003   dr Quincy Simmonds [facility]  . CESAREAN SECTION  10-12-2005  dr [facility]ann Punches [facility]  . DILATION AND CURETTAGE OF UTERUS  06-06-2004  dr aHarrington Chall[facility]   w/ suction for missed ab  . MANDIBLE SURGERY Bilateral age 46   b/l TMJ w/screws in place   Family History  Problem Relation Age of Onset  . Hypertension Father   . Arthritis Father   . Migraines Mother   .  Interstitial cystitis Mother   . Ulcers Maternal Grandmother        Peptic  . Anemia Maternal Grandmother   . Heart disease Maternal Grandfather   . Heart disease Paternal Grandfather   . Alzheimer's disease Paternal Grandmother   . Other Paternal Grandmother        Possible PE   Social History   Social History Narrative  . Not on file    Allergies as of 01/01/2020      Reactions   Sulfonamide Derivatives Nausea Only      Medication List       Accurate as of January 01, 2020 10:35 AM. If you have any questions, ask your nurse or doctor.        ALPRAZolam 0.25 MG tablet Commonly known as: XANAX Take 1 tablet (0.25 mg total) by mouth at bedtime as needed for anxiety or sleep.   escitalopram 20 MG tablet Commonly known as: LEXAPRO Take 1 tablet (20 mg total) by mouth daily.   metoprolol succinate 25 MG 24 hr tablet Commonly known as: TOPROL-XL TAKE 1 TABLET (25 MG TOTAL) BY MOUTH DAILY.   trazodone 300 MG tablet Commonly known as: DESYREL Take 1 tablet (300 mg total) by mouth at bedtime as needed for sleep.       All past medical history, surgical history, allergies, family history, immunizations andmedications were updated in the EMR today and reviewed under the history and medication portions of their EMR.     No results found for this or any previous visit (from the past 2160 hour(s)).  MM 3D SCREEN BREAST BILATERAL  Result Date: 12/10/2019 CLINICAL DATA:  Screening. EXAM: DIGITAL SCREENING BILATERAL MAMMOGRAM WITH TOMO AND CAD COMPARISON:  Previous exam(s). ACR Breast Density Category c: The breast tissue is heterogeneously dense, which may obscure small masses. FINDINGS: There are no findings suspicious for malignancy. Images were processed with CAD. IMPRESSION: No mammographic evidence of malignancy. A result letter of this screening mammogram will be mailed directly to the patient. RECOMMENDATION: Screening mammogram in one year. (Code:SM-B-01Y) BI-RADS CATEGORY   1: Negative. Electronically Signed   By: Nolon Nations M.D.   On: 12/10/2019 12:02    ROS: 14 pt review of systems performed and negative (unless mentioned in an HPI)  Objective: BP 111/72 (BP Location: Left Arm, Patient Position: Sitting, Cuff Size: Normal)   Pulse 92   Ht 5\' 3"  (1.6 m)   Wt 118 lb 12.8 oz (53.9 kg)   SpO2 98%   BMI 21.04 kg/m  Gen: Afebrile. No acute distress. Nontoxic in appearance, well-developed, well-nourished,  Pleasant female.  HENT: AT. Vickie Bennett. Bilateral TM visualized and normal in appearance, normal external auditory canal. MMM, no oral lesions, adequate dentition. Bilateral nares within normal limits. Throat without erythema, ulcerations or exudates. no Cough on exam, no hoarseness on exam. Eyes:Pupils Equal Round Reactive to light, Extraocular movements intact,  Conjunctiva without  redness, discharge or icterus. Neck/lymp/endocrine: Supple,no lymphadenopathy, no thyromegaly CV: RRR no murmur, no edema, +2/4 P posterior tibialis pulses. No JVD. Chest: CTAB, no wheeze, rhonchi or crackles. normal Respiratory effort. good Air movement. Abd: Soft. flat. NTND. BS present. no Masses palpated. No hepatosplenomegaly. No rebound tenderness or guarding. Skin: no rashes, purpura or petechiae. Warm and well-perfused. Skin intact. Neuro/Msk:  Normal gait. PERLA. EOMi. Alert. Oriented x3.  Cranial nerves II through XII intact. Muscle strength 5/5 upper/lower extremity. DTRs equal bilaterally. Psych: Normal affect, dress and demeanor. Normal speech. Normal thought content and judgment.   No exam data present  Assessment/plan: Vickie Bennett is a 44 y.o. female present for  Generalized anxiety disorder/insomnia/elevated TSH - stable - continue  Lexapro. - Continue  Xanax as prescribed.  [place] controlled substance database reviewed  Today.  - continue  trazodone 300 mg nightly. -Follow-up in 5.5 mos.   Tachycardia Stable.  Continue  metoprolol 25 mg daily   - Comprehensive metabolic panel - TSH Elevated TSH - TSH - T4, free Screening for deficiency anemia - CBC with Differential/Platelet Lipid screening - Lipid panel Diabetes mellitus screening - Hemoglobin A1c Encounter for preventive health examination Patient was encouraged to exercise greater than 150 minutes a week. Patient was encouraged to choose a diet filled with fresh fruits and vegetables, and lean meats. AVS provided to patient today for education/recommendation on gender specific health and safety maintenance. Colonoscopy: No fhx, screen at 47 Mammogram:completed 11/2019-- [facility] Cervical cancer screening: last pap:10/24/2016, results: Normal,neg co testcompleted by:Dr.Silva Immunizations: tdap UTD 2012, Influenza (encouraged yearly),COVID counseled.  Infectious disease screening: HIV completed  DEXA: per routine screen   Return in about 6 months (around 06/20/2020) for [place] (30 min) and 1 year cpe.   Orders Placed This Encounter  Procedures  . CBC with Differential/Platelet  . Comprehensive metabolic panel  . Lipid panel  . TSH  . Hemoglobin A1c  . T4, free   Meds ordered this encounter  Medications  . trazodone (DESYREL) 300 MG tablet    Sig: Take 1 tablet (300 mg total) by mouth at bedtime as needed for sleep.    Dispense:  90 tablet    Refill:  1  . metoprolol succinate (TOPROL-XL) 25 MG 24 hr tablet    Sig: TAKE 1 TABLET (25 MG TOTAL) BY MOUTH DAILY.    Dispense:  90 tablet    Refill:  1  . escitalopram (LEXAPRO) 20 MG tablet    Sig: Take 1 tablet (20 mg total) by mouth daily.    Dispense:  90 tablet    Refill:  1  . ALPRAZolam (XANAX) 0.25 MG tablet    Sig: Take 1 tablet (0.25 mg total) by mouth at bedtime as needed for anxiety or sleep.    Dispense:  90 tablet    Refill:  1   Referral Orders  No referral(s) requested today     Electronically signed by: Howard Pouch, [place]

## 2020-01-01 NOTE — Patient Instructions (Signed)
Health Maintenance, Female Adopting a healthy lifestyle and getting preventive care are important in promoting health and wellness. Ask your health care provider about:  The right schedule for you to have regular tests and exams.  Things you can do on your own to prevent diseases and keep yourself healthy. What should I know about diet, weight, and exercise? Eat a healthy diet   Eat a diet that includes plenty of vegetables, fruits, low-fat dairy products, and lean protein.  Do not eat a lot of foods that are high in solid fats, added sugars, or sodium. Maintain a healthy weight Body mass index (BMI) is used to identify weight problems. It estimates body fat based on height and weight. Your health care provider can help determine your BMI and help you achieve or maintain a healthy weight. Get regular exercise Get regular exercise. This is one of the most important things you can do for your health. Most adults should:  Exercise for at least 150 minutes each week. The exercise should increase your heart rate and make you sweat (moderate-intensity exercise).  Do strengthening exercises at least twice a week. This is in addition to the moderate-intensity exercise.  Spend less time sitting. Even light physical activity can be beneficial. Watch cholesterol and blood lipids Have your blood tested for lipids and cholesterol at 42 years of age, then have this test every 5 years. Have your cholesterol levels checked more often if:  Your lipid or cholesterol levels are high.  You are older than 42 years of age.  You are at high risk for heart disease. What should I know about cancer screening? Depending on your health history and family history, you may need to have cancer screening at various ages. This may include screening for:  Breast cancer.  Cervical cancer.  Colorectal cancer.  Skin cancer.  Lung cancer. What should I know about heart disease, diabetes, and high blood  pressure? Blood pressure and heart disease  High blood pressure causes heart disease and increases the risk of stroke. This is more likely to develop in people who have high blood pressure readings, are of African descent, or are overweight.  Have your blood pressure checked: ? Every 3-5 years if you are 18-39 years of age. ? Every year if you are 40 years old or older. Diabetes Have regular diabetes screenings. This checks your fasting blood sugar level. Have the screening done:  Once every three years after age 40 if you are at a normal weight and have a low risk for diabetes.  More often and at a younger age if you are overweight or have a high risk for diabetes. What should I know about preventing infection? Hepatitis B If you have a higher risk for hepatitis B, you should be screened for this virus. Talk with your health care provider to find out if you are at risk for hepatitis B infection. Hepatitis C Testing is recommended for:  Everyone born from 1945 through 1965.  Anyone with known risk factors for hepatitis C. Sexually transmitted infections (STIs)  Get screened for STIs, including gonorrhea and chlamydia, if: ? You are sexually active and are younger than 42 years of age. ? You are older than 42 years of age and your health care provider tells you that you are at risk for this type of infection. ? Your sexual activity has changed since you were last screened, and you are at increased risk for chlamydia or gonorrhea. Ask your health care provider if   you are at risk.  Ask your health care provider about whether you are at high risk for HIV. Your health care provider may recommend a prescription medicine to help prevent HIV infection. If you choose to take medicine to prevent HIV, you should first get tested for HIV. You should then be tested every 3 months for as long as you are taking the medicine. Pregnancy  If you are about to stop having your period (premenopausal) and  you may become pregnant, seek counseling before you get pregnant.  Take 400 to 800 micrograms (mcg) of folic acid every day if you become pregnant.  Ask for birth control (contraception) if you want to prevent pregnancy. Osteoporosis and menopause Osteoporosis is a disease in which the bones lose minerals and strength with aging. This can result in bone fractures. If you are 65 years old or older, or if you are at risk for osteoporosis and fractures, ask your health care provider if you should:  Be screened for bone loss.  Take a calcium or vitamin D supplement to lower your risk of fractures.  Be given hormone replacement therapy (HRT) to treat symptoms of menopause. Follow these instructions at home: Lifestyle  Do not use any products that contain nicotine or tobacco, such as cigarettes, e-cigarettes, and chewing tobacco. If you need help quitting, ask your health care provider.  Do not use street drugs.  Do not share needles.  Ask your health care provider for help if you need support or information about quitting drugs. Alcohol use  Do not drink alcohol if: ? Your health care provider tells you not to drink. ? You are pregnant, may be pregnant, or are planning to become pregnant.  If you drink alcohol: ? Limit how much you use to 0-1 drink a day. ? Limit intake if you are breastfeeding.  Be aware of how much alcohol is in your drink. In the [place] one drink equals one 12 oz bottle of beer (355 mL), one 5 oz glass of wine (148 mL), or one 1 oz glass of hard liquor (44 mL). General instructions  Schedule regular health, dental, and eye exams.  Stay current with your vaccines.  Tell your health care provider if: ? You often feel depressed. ? You have ever been abused or do not feel safe at home. Summary  Adopting a healthy lifestyle and getting preventive care are important in promoting health and wellness.  Follow your health care provider's instructions about healthy  diet, exercising, and getting tested or screened for diseases.  Follow your health care provider's instructions on monitoring your cholesterol and blood pressure. This information is not intended to replace advice given to you by your health care provider. Make sure you discuss any questions you have with your health care provider. Document Revised: 04/30/2018 Document Reviewed: 04/30/2018 Elsevier Patient Education  2020 Elsevier Inc.  

## 2020-01-04 ENCOUNTER — Telehealth: Payer: Self-pay

## 2020-01-04 NOTE — Telephone Encounter (Signed)
Voice message left for Vickie Bennett to return the phone call. No details given.

## 2020-01-04 NOTE — Telephone Encounter (Signed)
-----   Message from Ma Hillock, DO sent at 01/01/2020  5:19 PM EDT ----- Please call patient Liver, kidney and thyroid function are normal Blood cell counts and electrolytes are normal Diabetes screening/A1c is normal at 5.3 Cholesterol panel looks great and is at goal

## 2020-01-04 NOTE — Telephone Encounter (Signed)
Patient aware informed of lab results.

## 2020-04-13 ENCOUNTER — Encounter: Payer: Self-pay | Admitting: Family Medicine

## 2020-05-31 ENCOUNTER — Encounter: Payer: Self-pay | Admitting: Family Medicine

## 2020-06-29 ENCOUNTER — Telehealth: Payer: Self-pay

## 2020-06-29 NOTE — Telephone Encounter (Signed)
Patient is requesting visit she has scheduled on Monday 07/04/20 with Dr. Raoul Pitch to be a virtual visit due to work schedule. Appointment notes states:  Return in about 6 months (around 06/20/2020) for [facility]  Can this be approved to change to mychart visit?  Patient can be reached at [phone].

## 2020-06-30 ENCOUNTER — Encounter: Payer: Self-pay | Admitting: Family Medicine

## 2020-06-30 ENCOUNTER — Other Ambulatory Visit: Payer: Self-pay | Admitting: Family Medicine

## 2020-06-30 DIAGNOSIS — F411 Generalized anxiety disorder: Secondary | ICD-10-CM

## 2020-06-30 NOTE — Telephone Encounter (Signed)
Please see other encounter.

## 2020-06-30 NOTE — Telephone Encounter (Signed)
Can be mychart visit - please advise her to be prepared the day PRIOR ,  with downloads etc.

## 2020-06-30 NOTE — Telephone Encounter (Signed)
Patient is requesting visit she has scheduled on Monday 07/04/20 with Dr. Raoul Pitch to be a virtual visit due to work schedule. Appointment notes states:  Return in about 6 months (around 06/20/2020) for [facility]   Can this be approved to change to mychart visit?  Patient can be reached at [phone]

## 2020-07-01 NOTE — Telephone Encounter (Signed)
Dr. Raoul Pitch approved to change in office appt to [facility] virtual visit on 2/14.  Called patient to go over details regarding virtual visit.  She has an iPhone and will make sure her phone is upgraded. She understood.

## 2020-07-04 ENCOUNTER — Encounter: Payer: Self-pay | Admitting: Family Medicine

## 2020-07-04 ENCOUNTER — Telehealth (INDEPENDENT_AMBULATORY_CARE_PROVIDER_SITE_OTHER): Payer: BC Managed Care – PPO | Admitting: Family Medicine

## 2020-07-04 DIAGNOSIS — F99 Mental disorder, not otherwise specified: Secondary | ICD-10-CM | POA: Diagnosis not present

## 2020-07-04 DIAGNOSIS — F411 Generalized anxiety disorder: Secondary | ICD-10-CM | POA: Diagnosis not present

## 2020-07-04 DIAGNOSIS — F5105 Insomnia due to other mental disorder: Secondary | ICD-10-CM

## 2020-07-04 MED ORDER — ESCITALOPRAM OXALATE 20 MG PO TABS: 20.0000 mg | ORAL_TABLET | Freq: Every day | ORAL | 1 refills | Status: DC

## 2020-07-04 MED ORDER — TRAZODONE HCL 300 MG PO TABS: 300.0000 mg | ORAL_TABLET | Freq: Every evening | ORAL | 1 refills | Status: DC | PRN

## 2020-07-04 MED ORDER — ALPRAZOLAM 0.25 MG PO TABS: 0.2500 mg | ORAL_TABLET | Freq: Every evening | ORAL | 1 refills | Status: DC | PRN

## 2020-07-04 MED ORDER — METOPROLOL SUCCINATE ER 25 MG PO TB24: ORAL_TABLET | ORAL | 1 refills | Status: DC

## 2020-07-04 NOTE — Progress Notes (Signed)
VIRTUAL VISIT VIA VIDEO  I connected with Vickie Bennett on 07/04/20 at  3:00 PM EST by a video enabled telemedicine application and verified that I am speaking with the correct person using two identifiers. Location patient: Home Location provider: [facility], Office Persons participating in the virtual visit: Patient, Dr. Raoul Pitch and Cyndra Numbers, CMA  I discussed the limitations of evaluation and management by telemedicine and the availability of in person appointments. The patient expressed understanding and agreed to proceed.   Patient ID: Vickie Bennett, female  DOB: Oct 23, 1975, 45 y.o.   MRN: [id number] Patient Care Team    Relationship Specialty Notifications Start End  Ma Hillock, DO PCP - General Family Medicine  12/24/14   Oliver Pila, MD Referring Physician Obstetrics and Gynecology  12/26/15     Chief Complaint  Patient presents with  . Follow-up    [facility];     Subjective: Vickie Bennett is a 45 y.o.  Female  present for Anxiety/insomnia/tachycardia She reports her tachycardia and palpitations are well controlled on metoprolol 25 mg daily. She did have an episode x1 of tachycardia after drinking a cafeeine drink in the morning a few days ago. She reports compliance with trazodone 300 mg nightly, Lexapro 20 mg daily and Xanax 0.25 mgdaily as needed.    Depression screen [facility] 2/9 07/04/2020 01/01/2020 06/29/2019 12/29/2018 04/02/2018  Decreased Interest 0 0 0 0 0  Down, Depressed, Hopeless 0 0 0 0 0  PHQ - 2 Score 0 0 0 0 0  Altered sleeping 0 - - - -  Tired, decreased energy 0 - - - -  Change in appetite 0 - - - -  Feeling bad or failure about yourself  0 - - - -  Trouble concentrating 0 - - - -  Moving slowly or fidgety/restless 0 - - - -  Suicidal thoughts 0 - - - -  PHQ-9 Score 0 - - - -  Difficult doing work/chores - - - - -   GAD 7 : Generalized Anxiety Score 07/04/2020 06/29/2019 12/29/2018 10/01/2018  Nervous, Anxious, on Edge 1 0 0 0   Control/stop worrying 1 1 1  0  Worry too much - different things 1 0 1 0  Trouble relaxing 1 1 0 2  Restless 0 0 0 0  Easily annoyed or irritable 1 1 1 1   Afraid - awful might happen 0 0 0 0  Total GAD 7 Score 5 3 3 3   Anxiety Difficulty - Not difficult at all Somewhat difficult Not difficult at all    Immunization History  Administered Date(s) Administered  . HPV 9-valent 08/22/2017  . Influenza Split 03/07/2011, 03/12/2012  . Influenza,inj,Quad PF,6+ Mos 05/04/2013, 03/02/2015, 05/01/2016, 05/01/2017, 04/02/2018  . Influenza,inj,quad, With Preservative 03/01/2014  . Influenza-Unspecified 04/20/2014  . PFIZER(Purple Top)SARS-COV-2 Vaccination 01/02/2020, 01/27/2020  . PPD Test 12/26/2015  . Tdap 03/07/2011   Past Medical History:  Diagnosis Date  . Anxiety   . Hernia of anterior abdominal wall 06/20/2014  . History of kidney stones   . Hyperlipidemia   . TACHYCARDIA   . Tachycardia 04/21/2010   Qualifier: Diagnosis of  By: Charlett Blake MD, Erline Levine    . Ureteral calculus, left    Allergies  Allergen Reactions  . Sulfonamide Derivatives Nausea Only   Past Surgical History:  Procedure Laterality Date  . CERVICAL BIOPSY  W/ LOOP ELECTRODE EXCISION  05-28-2003   dr Quincy Simmonds [facility]  . CESAREAN SECTION  10-12-2005  dr Bethann Punches [facility]  . DILATION AND CURETTAGE OF UTERUS  06-06-2004  dr aHarrington Challenger [facility]   w/ suction for missed ab  . MANDIBLE SURGERY Bilateral age 47   b/l TMJ w/screws in place   Family History  Problem Relation Age of Onset  . Hypertension Father   . Arthritis Father   . Migraines Mother   . Interstitial cystitis Mother   . Ulcers Maternal Grandmother        Peptic  . Anemia Maternal Grandmother   . Heart disease Maternal Grandfather   . Heart disease Paternal Grandfather   . Alzheimer's disease Paternal Grandmother   . Other Paternal Grandmother        Possible PE   Social History   Social History Narrative  . Not on file    Allergies as of 07/04/2020       Reactions   Sulfonamide Derivatives Nausea Only      Medication List       Accurate as of July 04, 2020  2:53 PM. If you have any questions, ask your nurse or doctor.        ALPRAZolam 0.25 MG tablet Commonly known as: XANAX Take 1 tablet (0.25 mg total) by mouth at bedtime as needed for anxiety or sleep.   escitalopram 20 MG tablet Commonly known as: LEXAPRO Take 1 tablet (20 mg total) by mouth daily.   metoprolol succinate 25 MG 24 hr tablet Commonly known as: TOPROL-XL TAKE 1 TABLET (25 MG TOTAL) BY MOUTH DAILY.   trazodone 300 MG tablet Commonly known as: DESYREL Take 1 tablet (300 mg total) by mouth at bedtime as needed for sleep.       All past medical history, surgical history, allergies, family history, immunizations andmedications were updated in the EMR today and reviewed under the history and medication portions of their EMR.     No results found for this or any previous visit (from the past 2160 hour(s)).  MM 3D SCREEN BREAST BILATERAL  Result Date: 12/10/2019 CLINICAL DATA:  Screening. EXAM: DIGITAL SCREENING BILATERAL MAMMOGRAM WITH TOMO AND CAD COMPARISON:  Previous exam(s). ACR Breast Density Category c: The breast tissue is heterogeneously dense, which may obscure small masses. FINDINGS: There are no findings suspicious for malignancy. Images were processed with CAD. IMPRESSION: No mammographic evidence of malignancy. A result letter of this screening mammogram will be mailed directly to the patient. RECOMMENDATION: Screening mammogram in one year. (Code:SM-B-01Y) BI-RADS CATEGORY  1: Negative. Electronically Signed   By: Nolon Nations M.D.   On: 12/10/2019 12:02    ROS: 14 pt review of systems performed and negative (unless mentioned in an HPI)  Objective: There were no vitals taken for this visit. Gen: Afebrile. No acute distress.  HENT: AT. [place].  Eyes:Pupils Equal Round Reactive to light, Extraocular movements intact,  Conjunctiva without  redness, discharge or icterus. Neuro:PERLA. EOMi. Alert. Oriented x3 Psych: Normal affect, dress and demeanor. Normal speech. Normal thought content and judgment.   No exam data present  Assessment/plan: GERALDINA PARROTT is a 45 y.o. female present for  Generalized anxiety disorder/insomnia/elevated TSH - stable.  - continue  Lexapro. - continue   Xanax as prescribed.  [place] controlled substance database reviewed  Today.  - continue   trazodone 300 mg nightly. -Follow-up in 5.5 mos.   Tachycardia stable Continue metoprolol 25 mg daily  - labs due next visit.   Return in about 26 weeks (around 01/02/2021) for CPE (30 min), [facility] (  30 min).   No orders of the defined types were placed in this encounter.  Meds ordered this encounter  Medications  . escitalopram (LEXAPRO) 20 MG tablet    Sig: Take 1 tablet (20 mg total) by mouth daily.    Dispense:  90 tablet    Refill:  1  . metoprolol succinate (TOPROL-XL) 25 MG 24 hr tablet    Sig: TAKE 1 TABLET (25 MG TOTAL) BY MOUTH DAILY.    Dispense:  90 tablet    Refill:  1  . trazodone (DESYREL) 300 MG tablet    Sig: Take 1 tablet (300 mg total) by mouth at bedtime as needed for sleep.    Dispense:  90 tablet    Refill:  1  . ALPRAZolam (XANAX) 0.25 MG tablet    Sig: Take 1 tablet (0.25 mg total) by mouth at bedtime as needed for anxiety or sleep.    Dispense:  90 tablet    Refill:  1   Referral Orders  No referral(s) requested today     Electronically signed by: Howard Pouch, [place]

## 2020-08-15 ENCOUNTER — Ambulatory Visit: Payer: BC Managed Care – PPO | Admitting: Family Medicine

## 2020-08-15 ENCOUNTER — Encounter: Payer: Self-pay | Admitting: Family Medicine

## 2020-08-15 ENCOUNTER — Other Ambulatory Visit: Payer: Self-pay

## 2020-08-15 VITALS — BP 82/65 | HR 101 | Temp 97.2°F | Ht 63.0 in | Wt 126.0 lb

## 2020-08-15 DIAGNOSIS — M545 Low back pain, unspecified: Secondary | ICD-10-CM | POA: Diagnosis not present

## 2020-08-15 DIAGNOSIS — S39012A Strain of muscle, fascia and tendon of lower back, initial encounter: Secondary | ICD-10-CM

## 2020-08-15 MED ORDER — CYCLOBENZAPRINE HCL 10 MG PO TABS: 10.0000 mg | ORAL_TABLET | Freq: Three times a day (TID) | ORAL | 0 refills | Status: DC | PRN

## 2020-08-15 MED ORDER — NAPROXEN 500 MG PO TABS: 500.0000 mg | ORAL_TABLET | Freq: Two times a day (BID) | ORAL | 0 refills | Status: DC

## 2020-08-15 MED ORDER — METHYLPREDNISOLONE ACETATE 40 MG/ML IJ SUSP
80.0000 mg | Freq: Once | INTRAMUSCULAR | Status: AC
  Administered 2020-08-15: 80 mg via INTRAMUSCULAR

## 2020-08-15 NOTE — Patient Instructions (Signed)
Muscle Strain A muscle strain (pulled muscle) happens when a muscle is stretched beyond normal length. This can happen during a fall, sports, or lifting. This can tear some muscle fibers. Usually, recovery from muscle strain takes 1-2 weeks. Complete healing normally takes 5-6 weeks. This condition is first treated with PRICE therapy. This involves:  Protecting your muscle from being injured again.  Resting your injured muscle.  Icing your injured muscle.  Applying pressure (compression) to your injured muscle. This may be done with a splint or elastic bandage.  Raising (elevating) your injured muscle. Your doctor may also recommend medicine for pain. Follow these instructions at home: If you have a splint:  Wear the splint as told by your doctor. Take it off only as told by your doctor.  Loosen the splint if your fingers or toes tingle, get numb, or turn cold and blue.  Keep the splint clean.  If the splint is not waterproof: ? Do not let it get wet. ? Cover it with a watertight covering when you take a bath or a shower. Managing pain, stiffness, and swelling  If told, put ice on your injured area: ? If you have a removable splint, take it off as told by your doctor. ? Put ice in a plastic bag. ? Place a towel between your skin and the bag. ? Leave the ice on for 20 minutes, 2-3 times a day.  Move your fingers or toes often. This helps to avoid stiffness and lessen swelling.  Raise your injured area above the level of your heart while you are sitting or lying down.  Wear an elastic bandage as told by your doctor. Make sure it is not too tight.   General instructions  Take over-the-counter and prescription medicines only as told by your doctor. This may include medicines for pain and swelling that are taken by mouth or put on the skin, prescription pain medicine, or muscle relaxants.  Limit your activity. Rest your injured muscle as told by your doctor. Your doctor may say  that gentle movements are okay.  If physical therapy was prescribed, do exercises as told by your doctor.  Do not put pressure on any part of the splint until it is fully hardened. This may take many hours.  Do not use any products that contain nicotine or tobacco, such as cigarettes and e-cigarettes. These can delay bone healing. If you need help quitting, ask your doctor.  Warm up before you exercise. This helps to prevent more muscle strains.  Ask your doctor when it is safe to drive if you have a splint.  Keep all follow-up visits as told by your doctor. This is important. Contact a doctor if:  You have more pain or swelling in your injured area. Get help right away if:  You have any of these problems in your injured area: ? You have numbness. ? You have tingling. ? You lose a lot of strength. Summary  A muscle strain is an injury that happens when a muscle is stretched longer than normal.  This condition is first treated with PRICE therapy. This includes protecting, resting, icing, adding pressure, and raising your injury.  Limit your activity. Rest your injured muscle as told by your doctor. Your doctor may say that gentle movements are okay.  Warm up before you exercise. This helps to prevent more muscle strains. This information is not intended to replace advice given to you by your health care provider. Make sure you discuss any questions  you have with your health care provider. Document Revised: 01/29/2020 Document Reviewed: 01/29/2020 Elsevier Patient Education  2021 [employer].

## 2020-08-15 NOTE — Progress Notes (Signed)
This visit occurred during the SARS-CoV-2 public health emergency.  Safety protocols were in place, including screening questions prior to the visit, additional usage of staff PPE, and extensive cleaning of exam room while observing appropriate contact time as indicated for disinfecting solutions.    Vickie Bennett , 02/23/1976, 45 y.o., female MRN: [id number] Patient Care Team    Relationship Specialty Notifications Start End  Ma Hillock, DO PCP - General Family Medicine  12/24/14   Oliver Pila, MD Referring Physician Obstetrics and Gynecology  12/26/15     Chief Complaint  Patient presents with  . Back Pain    Pt c/o LBP x 2 days worsen with movement; pt states some discomfort in lower abd.      Subjective: Pt presents for an OV with complaints of bilateral "low back "pain of 2 days duration.  Patient reports she was laying on the couch and rolled from the left to the right to pick up a cell phone off the table and she immediately felt her back catch.  She was unable to stand up.  She denies radiation of pain to her lower extremities.  She denies any bowel or bladder dysfunction.  She did take ibuprofen 800 mg twice daily yesterday but did not feel it resolved her pain.  She does not use it mildly improved the pain.  Since day of injury she has had consistent pain that is worse with any movement including-walking up stairs, bending over or to the side, transitioning between positions.  She has never had a back injury in the past.  She has never had back surgery in the past.  Depression screen [facility] 2/9 07/04/2020 01/01/2020 06/29/2019 12/29/2018 04/02/2018  Decreased Interest 0 0 0 0 0  Down, Depressed, Hopeless 0 0 0 0 0  PHQ - 2 Score 0 0 0 0 0  Altered sleeping 0 - - - -  Tired, decreased energy 0 - - - -  Change in appetite 0 - - - -  Feeling bad or failure about yourself  0 - - - -  Trouble concentrating 0 - - - -  Moving slowly or fidgety/restless 0 - - - -  Suicidal  thoughts 0 - - - -  PHQ-9 Score 0 - - - -  Difficult doing work/chores - - - - -    Allergies  Allergen Reactions  . Sulfonamide Derivatives Nausea Only   Social History   Social History Narrative  . Not on file   Past Medical History:  Diagnosis Date  . Anxiety   . Hernia of anterior abdominal wall 06/20/2014  . History of kidney stones   . Hyperlipidemia   . Tachycardia 04/21/2010   Qualifier: Diagnosis of  By: Charlett Blake MD, Erline Levine    . Ureteral calculus, left    Past Surgical History:  Procedure Laterality Date  . CERVICAL BIOPSY  W/ LOOP ELECTRODE EXCISION  05-28-2003   dr Quincy Simmonds [facility]  . CESAREAN SECTION  10-12-2005  dr Bethann Punches [facility]  . DILATION AND CURETTAGE OF UTERUS  06-06-2004  dr aHarrington Challenger [facility]   w/ suction for missed ab  . MANDIBLE SURGERY Bilateral age 56   b/l TMJ w/screws in place   Family History  Problem Relation Age of Onset  . Hypertension Father   . Arthritis Father   . Migraines Mother   . Interstitial cystitis Mother   . Ulcers Maternal Grandmother        Peptic  .  Anemia Maternal Grandmother   . Heart disease Maternal Grandfather   . Heart disease Paternal Grandfather   . Alzheimer's disease Paternal Grandmother   . Other Paternal Grandmother        Possible PE   Allergies as of 08/15/2020      Reactions   Sulfonamide Derivatives Nausea Only      Medication List       Accurate as of August 15, 2020  2:25 PM. If you have any questions, ask your nurse or doctor.        ALPRAZolam 0.25 MG tablet Commonly known as: XANAX Take 1 tablet (0.25 mg total) by mouth at bedtime as needed for anxiety or sleep.   cyclobenzaprine 10 MG tablet Commonly known as: FLEXERIL Take 1 tablet (10 mg total) by mouth 3 (three) times daily as needed for muscle spasms. Started by: Howard Pouch, DO   escitalopram 20 MG tablet Commonly known as: LEXAPRO Take 1 tablet (20 mg total) by mouth daily.   metoprolol succinate 25 MG 24 hr tablet Commonly known as:  TOPROL-XL TAKE 1 TABLET (25 MG TOTAL) BY MOUTH DAILY.   naproxen 500 MG tablet Commonly known as: Naprosyn Take 1 tablet (500 mg total) by mouth 2 (two) times daily with a meal. Started by: Howard Pouch, DO   trazodone 300 MG tablet Commonly known as: DESYREL Take 1 tablet (300 mg total) by mouth at bedtime as needed for sleep.       All past medical history, surgical history, allergies, family history, immunizations andmedications were updated in the EMR today and reviewed under the history and medication portions of their EMR.     ROS: Negative, with the exception of above mentioned in HPI   Objective:  BP (!) 82/65   Pulse (!) 101   Temp (!) 97.2 F (36.2 C) (Oral)   Ht 5\' 3"  (1.6 m)   Wt 126 lb (57.2 kg)   LMP 07/25/2020   SpO2 99%   BMI 22.32 kg/m  Body mass index is 22.32 kg/m. Gen: Afebrile. No acute distress. Nontoxic in appearance, well developed, well nourished.  HENT: AT. [place].  Eyes:Pupils Equal Round Reactive to light, Extraocular movements intact,  Conjunctiva without redness, discharge or icterus. MSK: Lumbar spine without bony tenderness.  Bilateral SI joints without tenderness.  Tender to palpation over left and right sacral area.  Mild tenderness over piriformis bilaterally.  Muscle strength 5/5 bilateral lower extremity.  Neurovascular intact distally. Skin: No rashes, purpura or petechiae.  Neuro:  Normal gait. PERLA. EOMi. Alert. Oriented x3  Psych: Normal affect, dress and demeanor. Normal speech. Normal thought content and judgment.  No exam data present No results found. No results found for this or any previous visit (from the past 24 hour(s)).  Assessment/Plan: Vickie Bennett is a 45 y.o. female present for OV for  Acute low back pain without sciatica, unspecified back pain laterality/sacral strain Discussed strain of sacral area and/or gluteus muscle group.  Suspect sacral somatic dysfunction with level of discomfort. Rest, heat/ice,  scheduled NSAIDs, muscle relaxer.  Steroid injection provided today. Flexeril 5-10 mg 3 times daily.  Patient was cautioned on sedation and was advised not to take with her trazodone. Naproxen 500 mg twice daily with food. - methylPREDNISolone acetate (DEPO-MEDROL) injection 80 mg Follow-up 2-4 weeks, sooner if worsening.     Reviewed expectations re: course of current medical issues.  Discussed self-management of symptoms.  Outlined signs and symptoms indicating need for more acute intervention.  Patient verbalized understanding and all questions were answered.  Patient received an After-Visit Summary.    No orders of the defined types were placed in this encounter.  Meds ordered this encounter  Medications  . cyclobenzaprine (FLEXERIL) 10 MG tablet    Sig: Take 1 tablet (10 mg total) by mouth 3 (three) times daily as needed for muscle spasms.    Dispense:  30 tablet    Refill:  0  . naproxen (NAPROSYN) 500 MG tablet    Sig: Take 1 tablet (500 mg total) by mouth 2 (two) times daily with a meal.    Dispense:  30 tablet    Refill:  0  . methylPREDNISolone acetate (DEPO-MEDROL) injection 80 mg   Referral Orders  No referral(s) requested today     Note is dictated utilizing voice recognition software. Although note has been proof read prior to signing, occasional typographical errors still can be missed. If any questions arise, please do not hesitate to call for verification.   electronically signed by:  Howard Pouch, DO  [place]

## 2020-08-25 ENCOUNTER — Encounter: Payer: Self-pay | Admitting: Family Medicine

## 2020-08-29 ENCOUNTER — Ambulatory Visit: Payer: BC Managed Care – PPO | Admitting: Family Medicine

## 2020-08-29 ENCOUNTER — Encounter: Payer: Self-pay | Admitting: Family Medicine

## 2020-08-29 NOTE — Telephone Encounter (Signed)
Called patient and scheduled her daughter for a new patient appt 12/27/20.

## 2020-11-08 ENCOUNTER — Other Ambulatory Visit: Payer: Self-pay | Admitting: Family Medicine

## 2020-11-08 DIAGNOSIS — Z1231 Encounter for screening mammogram for malignant neoplasm of breast: Secondary | ICD-10-CM

## 2021-01-02 ENCOUNTER — Encounter: Payer: BC Managed Care – PPO | Admitting: Family Medicine

## 2021-01-02 ENCOUNTER — Other Ambulatory Visit: Payer: Self-pay

## 2021-01-02 ENCOUNTER — Ambulatory Visit
Admission: RE | Admit: 2021-01-02 | Discharge: 2021-01-02 | Disposition: A | Discharge status: Home / Self Care | Payer: BC Managed Care – PPO | Source: Ambulatory Visit | Attending: Family Medicine | Admitting: Family Medicine

## 2021-01-02 DIAGNOSIS — Z1231 Encounter for screening mammogram for malignant neoplasm of breast: Secondary | ICD-10-CM

## 2021-01-12 ENCOUNTER — Other Ambulatory Visit: Payer: Self-pay

## 2021-01-13 ENCOUNTER — Ambulatory Visit (INDEPENDENT_AMBULATORY_CARE_PROVIDER_SITE_OTHER): Payer: BC Managed Care – PPO | Admitting: Family Medicine

## 2021-01-13 ENCOUNTER — Encounter: Payer: Self-pay | Admitting: Family Medicine

## 2021-01-13 VITALS — BP 94/65 | HR 101 | Temp 98.1°F | Ht 63.0 in | Wt 131.0 lb

## 2021-01-13 DIAGNOSIS — Z0000 Encounter for general adult medical examination without abnormal findings: Secondary | ICD-10-CM | POA: Diagnosis not present

## 2021-01-13 DIAGNOSIS — Z131 Encounter for screening for diabetes mellitus: Secondary | ICD-10-CM | POA: Diagnosis not present

## 2021-01-13 DIAGNOSIS — Z23 Encounter for immunization: Secondary | ICD-10-CM | POA: Diagnosis not present

## 2021-01-13 DIAGNOSIS — R000 Tachycardia, unspecified: Secondary | ICD-10-CM

## 2021-01-13 DIAGNOSIS — F5105 Insomnia due to other mental disorder: Secondary | ICD-10-CM

## 2021-01-13 DIAGNOSIS — R7989 Other specified abnormal findings of blood chemistry: Secondary | ICD-10-CM | POA: Diagnosis not present

## 2021-01-13 DIAGNOSIS — Z79899 Other long term (current) drug therapy: Secondary | ICD-10-CM | POA: Diagnosis not present

## 2021-01-13 DIAGNOSIS — Z1159 Encounter for screening for other viral diseases: Secondary | ICD-10-CM

## 2021-01-13 DIAGNOSIS — F411 Generalized anxiety disorder: Secondary | ICD-10-CM | POA: Diagnosis not present

## 2021-01-13 DIAGNOSIS — F99 Mental disorder, not otherwise specified: Secondary | ICD-10-CM

## 2021-01-13 LAB — TSH: TSH: 3.39 u[IU]/mL (ref 0.35–5.50)

## 2021-01-13 LAB — CBC WITH DIFFERENTIAL/PLATELET
Basophils Absolute: 0 10*3/uL (ref 0.0–0.1)
Basophils Relative: 0.9 % (ref 0.0–3.0)
Eosinophils Absolute: 0.1 10*3/uL (ref 0.0–0.7)
Eosinophils Relative: 2 % (ref 0.0–5.0)
HCT: 43.1 % (ref 36.0–46.0)
Hemoglobin: 14.1 g/dL (ref 12.0–15.0)
Lymphocytes Relative: 24 % (ref 12.0–46.0)
Lymphs Abs: 1.2 10*3/uL (ref 0.7–4.0)
MCHC: 32.6 g/dL (ref 30.0–36.0)
MCV: 100.4 fl — ABNORMAL HIGH (ref 78.0–100.0)
Monocytes Absolute: 0.3 10*3/uL (ref 0.1–1.0)
Monocytes Relative: 6.1 % (ref 3.0–12.0)
Neutro Abs: 3.4 10*3/uL (ref 1.4–7.7)
Neutrophils Relative %: 67 % (ref 43.0–77.0)
Platelets: 183 10*3/uL (ref 150.0–400.0)
RBC: 4.29 Mil/uL (ref 3.87–5.11)
RDW: 12.8 % (ref 11.5–15.5)
WBC: 5 10*3/uL (ref 4.0–10.5)

## 2021-01-13 LAB — COMPREHENSIVE METABOLIC PANEL
ALT: 17 U/L (ref 0–35)
AST: 17 U/L (ref 0–37)
Albumin: 4.2 g/dL (ref 3.5–5.2)
Alkaline Phosphatase: 60 U/L (ref 39–117)
BUN: 11 mg/dL (ref 6–23)
CO2: 25 mEq/L (ref 19–32)
Calcium: 9.3 mg/dL (ref 8.4–10.5)
Chloride: 106 mEq/L (ref 96–112)
Creatinine, Ser: 0.86 mg/dL (ref 0.40–1.20)
GFR: 82.91 mL/min (ref >60.00)
Glucose, Bld: 87 mg/dL (ref 70–99)
Potassium: 4.1 mEq/L (ref 3.5–5.1)
Sodium: 140 mEq/L (ref 135–145)
Total Bilirubin: 0.5 mg/dL (ref 0.2–1.2)
Total Protein: 6.4 g/dL (ref 6.0–8.3)

## 2021-01-13 LAB — LIPID PANEL
Cholesterol: 149 mg/dL (ref 0–200)
HDL: 73.8 mg/dL (ref >39.00)
LDL Cholesterol: 62 mg/dL (ref 0–99)
NonHDL: 75.42
Total CHOL/HDL Ratio: 2
Triglycerides: 65 mg/dL (ref 0.0–149.0)
VLDL: 13 mg/dL (ref 0.0–40.0)

## 2021-01-13 LAB — T4, FREE: Free T4: 0.61 ng/dL (ref 0.60–1.60)

## 2021-01-13 LAB — HEMOGLOBIN A1C: Hgb A1c MFr Bld: 5.4 % (ref 4.6–6.5)

## 2021-01-13 MED ORDER — METOPROLOL SUCCINATE ER 25 MG PO TB24: ORAL_TABLET | ORAL | 1 refills | Status: DC

## 2021-01-13 MED ORDER — ESCITALOPRAM OXALATE 20 MG PO TABS: 20.0000 mg | ORAL_TABLET | Freq: Every day | ORAL | 1 refills | Status: DC

## 2021-01-13 MED ORDER — TRAZODONE HCL 300 MG PO TABS: 300.0000 mg | ORAL_TABLET | Freq: Every evening | ORAL | 1 refills | Status: DC | PRN

## 2021-01-13 MED ORDER — ALPRAZOLAM 0.25 MG PO TABS: 0.2500 mg | ORAL_TABLET | Freq: Every evening | ORAL | 1 refills | Status: DC | PRN

## 2021-01-13 NOTE — Progress Notes (Signed)
This visit occurred during the SARS-CoV-2 public health emergency.  Safety protocols were in place, including screening questions prior to the visit, additional usage of staff PPE, and extensive cleaning of exam room while observing appropriate contact time as indicated for disinfecting solutions.    Patient ID: Vickie Bennett, female  DOB: 1975-11-30, 45 y.o.   MRN: [id number] Patient Care Team    Relationship Specialty Notifications Start End  Ma Hillock, DO PCP - General Family Medicine  12/24/14   Oliver Pila, MD Referring Physician Obstetrics and Gynecology  12/26/15     Chief Complaint  Patient presents with   Annual Exam    Subjective: Vickie Bennett is a 45 y.o.  Female  present for CPE/cmc. All past medical history, surgical history, allergies, family history, immunizations, medications and social history were updated in the electronic medical record today. All recent labs, ED visits and hospitalizations within the last year were reviewed.  Health maintenance:  Colonoscopy: No fhx, screen at 47 Mammogram: completed 12/2020-- Getting better at [facility]. Breast center> ordered Cervical cancer screening: last pap:10/24/2017, results: Normal, neg co test completed by:Dr.  Quincy Simmonds Immunizations: tdap administered today, Influenza administered today (encouraged yearly),COVID completed x2 Infectious disease screening: HIV completed , Hep C ordered DEXA: per routine screen Assistive device: none Oxygen [id number] Patient has a Dental home. Hospitalizations/ED visits: reviewed  Anxiety/insomnia/tachycardia She reports her tachycardia and palpitations are very well controlled on metoprolol 25 mg daily.  She reports compliance with trazodone 300 mg nightly, Lexapro 20 mg daily and Xanax 0.25 mg daily as needed. She feels this regimen is working well for her. School has started back this week and kids start next week.   Depression screen [facility] 2/9 01/13/2021 07/04/2020 01/01/2020  06/29/2019 12/29/2018  Decreased Interest 0 0 0 0 0  Down, Depressed, Hopeless 0 0 0 0 0  PHQ - 2 Score 0 0 0 0 0  Altered sleeping 0 0 - - -  Tired, decreased energy 0 0 - - -  Change in appetite 1 0 - - -  Feeling bad or failure about yourself  0 0 - - -  Trouble concentrating 0 0 - - -  Moving slowly or fidgety/restless 0 0 - - -  Suicidal thoughts 0 0 - - -  PHQ-9 Score 1 0 - - -  Difficult doing work/chores - - - - -   GAD 7 : Generalized Anxiety Score 01/13/2021 07/04/2020 06/29/2019 12/29/2018  Nervous, Anxious, on Edge 1 1 0 0  Control/stop worrying 0 '1 1 1  '$ Worry too much - different things 0 1 0 1  Trouble relaxing 0 1 1 0  Restless 0 0 0 0  Easily annoyed or irritable 0 '1 1 1  '$ Afraid - awful might happen 1 0 0 0  Total GAD 7 Score '2 5 3 3  '$ Anxiety Difficulty - - Not difficult at all Somewhat difficult    Immunization History  Administered Date(s) Administered   HPV 9-valent 08/22/2017   Influenza Split 03/07/2011, 03/12/2012   Influenza,inj,Quad PF,6+ Mos 05/04/2013, 03/02/2015, 05/01/2016, 05/01/2017, 04/02/2018, 01/13/2021   Influenza,inj,quad, With Preservative 03/01/2014   Influenza-Unspecified 04/20/2014   PFIZER(Purple Top)SARS-COV-2 Vaccination 01/02/2020, 01/27/2020   PPD Test 12/26/2015   Tdap 03/07/2011, 01/13/2021   Past Medical History:  Diagnosis Date   Anxiety    Hernia of anterior abdominal wall 06/20/2014   History of kidney stones    Hyperlipidemia    Tachycardia 04/21/2010   Qualifier: Diagnosis  of  By: Charlett Blake MD, Stacey     Ureteral calculus, left    Allergies  Allergen Reactions   Sulfonamide Derivatives Nausea Only   Past Surgical History:  Procedure Laterality Date   CERVICAL BIOPSY  W/ LOOP ELECTRODE EXCISION  05-28-2003   dr Quincy Simmonds [place]  10-12-2005  dr Jerilynn Mages. [place]   DILATION AND CURETTAGE OF UTERUS  06-06-2004  dr aHarrington Challenger [facility]   w/ suction for missed ab   MANDIBLE SURGERY Bilateral age 66   b/l TMJ w/screws in place    Family History  Problem Relation Age of Onset   Hypertension Father    Arthritis Father    Migraines Mother    Interstitial cystitis Mother    Ulcers Maternal Grandmother        Peptic   Anemia Maternal Grandmother    Heart disease Maternal Grandfather    Heart disease Paternal Grandfather    Alzheimer's disease Paternal Grandmother    Other Paternal Grandmother        Possible PE   Social History   Social History Narrative   Not on file    Allergies as of 01/13/2021       Reactions   Sulfonamide Derivatives Nausea Only        Medication List        Accurate as of January 13, 2021 10:21 AM. If you have any questions, ask your nurse or doctor.          STOP taking these medications    cephALEXin 500 MG capsule Commonly known as: KEFLEX Stopped by: Howard Pouch, DO   cyclobenzaprine 10 MG tablet Commonly known as: FLEXERIL Stopped by: Howard Pouch, DO   naproxen 500 MG tablet Commonly known as: Naprosyn Stopped by: Howard Pouch, DO       TAKE these medications    ALPRAZolam 0.25 MG tablet Commonly known as: XANAX Take 1 tablet (0.25 mg total) by mouth at bedtime as needed for anxiety or sleep.   escitalopram 20 MG tablet Commonly known as: LEXAPRO Take 1 tablet (20 mg total) by mouth daily.   metoprolol succinate 25 MG 24 hr tablet Commonly known as: TOPROL-XL TAKE 1 TABLET (25 MG TOTAL) BY MOUTH DAILY.   trazodone 300 MG tablet Commonly known as: DESYREL Take 1 tablet (300 mg total) by mouth at bedtime as needed for sleep.        All past medical history, surgical history, allergies, family history, immunizations andmedications were updated in the EMR today and reviewed under the history and medication portions of their EMR.     No results found for this or any previous visit (from the past 2160 hour(s)).  MM 3D SCREEN BREAST BILATERAL  Result Date: 01/03/2021 CLINICAL DATA:  Screening. EXAM: DIGITAL SCREENING BILATERAL MAMMOGRAM  WITH TOMOSYNTHESIS AND CAD TECHNIQUE: Bilateral screening digital craniocaudal and mediolateral oblique mammograms were obtained. Bilateral screening digital breast tomosynthesis was performed. The images were evaluated with computer-aided detection. COMPARISON:  Previous exam(s). ACR Breast Density Category c: The breast tissue is heterogeneously dense, which may obscure small masses. FINDINGS: There are no findings suspicious for malignancy. IMPRESSION: No mammographic evidence of malignancy. A result letter of this screening mammogram will be mailed directly to the patient. RECOMMENDATION: Screening mammogram in one year. (Code:SM-B-01Y) BI-RADS CATEGORY  1: Negative. Electronically Signed   By: Kristopher Oppenheim M.D.   On: 01/03/2021 10:22     ROS: 14 pt review of systems performed and negative (  unless mentioned in an HPI)  Objective: BP 94/65   Pulse (!) 101   Temp 98.1 F (36.7 C) (Oral)   Ht '5\' 3"'$  (1.6 m)   Wt 131 lb (59.4 kg)   LMP 12/27/2020   SpO2 100%   BMI 23.21 kg/m  Gen: Afebrile. No acute distress. Nontoxic in appearance, well-developed, well-nourished,  very pleasant female.  HENT: AT. [place]. Bilateral TM visualized and normal in appearance, normal external auditory canal. MMM, no oral lesions, adequate dentition. Bilateral nares within normal limits. Throat without erythema, ulcerations or exudates. No Cough on exam, no hoarseness on exam. Eyes:Pupils Equal Round Reactive to light, Extraocular movements intact,  Conjunctiva without redness, discharge or icterus. Neck/lymp/endocrine: Supple,no lymphadenopathy, no thyromegaly CV: RRR no murmur, no edema, +2/4 P posterior tibialis pulses.  Chest: CTAB, no wheeze, rhonchi or crackles. normal Respiratory effort. good Air movement. Abd: Soft. flat. NTND. BS present. no Masses palpated. No hepatosplenomegaly. No rebound tenderness or guarding. Skin: no rashes, purpura or petechiae. Warm and well-perfused. Skin intact. Neuro/Msk: Normal  gait. PERLA. EOMi. Alert. Oriented x3.  Cranial nerves II through XII intact. Muscle strength 5/5 upper/lower extremity. DTRs equal bilaterally. Psych: Normal affect, dress and demeanor. Normal speech. Normal thought content and judgment.   No results found.  Assessment/plan: Vickie Bennett is a 45 y.o. female present for CPE/CMC Generalized anxiety disorder/insomnia/Benzodiazepine contract exists Stable. - continue Lexapro. - continue  Xanax as prescribed.  [place] reviewed  01/13/21 - continue  trazodone 300 mg nightly. -Follow-up in 5.5 mos.    Tachycardia Stable.  Continue metoprolol 25 mg daily  Cbc, cmp, tsh, lipids collected today Elevated TSH - Lipid panel - TSH - T4, free  Influenza vaccine administered Need for diphtheria-tetanus-pertussis (Tdap) vaccine> administered Need for hepatitis C screening test - Hepatitis C antibody Encounter for long-term current use of medication - Comprehensive metabolic panel Diabetes mellitus screening - Hemoglobin A1c Routine general medical examination at a health care facility Colonoscopy: No fhx, screen at 47 Mammogram: completed 12/2020-- Getting better at [facility]. Breast center> ordered Cervical cancer screening: last pap:10/24/2017, results: Normal, neg co test completed by:Dr.  Quincy Simmonds Immunizations: tdap administered today, Influenza administered today (encouraged yearly),COVID completed x2 Infectious disease screening: HIV completed , Hep C ordered DEXA: per routine screen Patient was encouraged to exercise greater than 150 minutes a week. Patient was encouraged to choose a diet filled with fresh fruits and vegetables, and lean meats. AVS provided to patient today for education/recommendation on gender specific health and safety maintenance.  Return in about 6 months (around 07/03/2021) for [place] (30 min) and 25yrd for cpe.  Orders Placed This Encounter  Procedures   Tdap vaccine greater than  or equal to 7yo IM   Flu Vaccine QUAD 6+ mos PF IM (Fluarix Quad PF)   CBC with Differential/Platelet   Comprehensive metabolic panel   Hemoglobin A1c   Lipid panel   TSH   T4, free   Hepatitis C antibody   Meds ordered this encounter  Medications   escitalopram (LEXAPRO) 20 MG tablet    Sig: Take 1 tablet (20 mg total) by mouth daily.    Dispense:  90 tablet    Refill:  1   metoprolol succinate (TOPROL-XL) 25 MG 24 hr tablet    Sig: TAKE 1 TABLET (25 MG TOTAL) BY MOUTH DAILY.    Dispense:  90 tablet    Refill:  1   trazodone (DESYREL) 300 MG tablet  Sig: Take 1 tablet (300 mg total) by mouth at bedtime as needed for sleep.    Dispense:  90 tablet    Refill:  1   ALPRAZolam (XANAX) 0.25 MG tablet    Sig: Take 1 tablet (0.25 mg total) by mouth at bedtime as needed for anxiety or sleep.    Dispense:  90 tablet    Refill:  1   Referral Orders  No referral(s) requested today     Electronically signed by: Howard Pouch, [place]

## 2021-01-13 NOTE — Patient Instructions (Signed)
Great to see you today.  I have refilled the medication(s) we provide.   If labs were collected, we will inform you of lab results once received either by echart message or telephone call.   - echart message- for normal results that have been seen by the patient already.   - telephone call: abnormal results or if patient has not viewed results in their echart.   Next appt early February fr chronic conditions.    Health Maintenance, Female Adopting a healthy lifestyle and getting preventive care are important in promoting health and wellness. Ask your health care provider about: The right schedule for you to have regular tests and exams. Things you can do on your own to prevent diseases and keep yourself healthy. What should I know about diet, weight, and exercise? Eat a healthy diet  Eat a diet that includes plenty of vegetables, fruits, low-fat dairy products, and lean protein. Do not eat a lot of foods that are high in solid fats, added sugars, or sodium.  Maintain a healthy weight Body mass index (BMI) is used to identify weight problems. It estimates body fat based on height and weight. Your health care provider can help determineyour BMI and help you achieve or maintain a healthy weight. Get regular exercise Get regular exercise. This is one of the most important things you can do for your health. Most adults should: Exercise for at least 150 minutes each week. The exercise should increase your heart rate and make you sweat (moderate-intensity exercise). Do strengthening exercises at least twice a week. This is in addition to the moderate-intensity exercise. Spend less time sitting. Even light physical activity can be beneficial. Watch cholesterol and blood lipids Have your blood tested for lipids and cholesterol at 43 years of age, then havethis test every 5 years. Have your cholesterol levels checked more often if: Your lipid or cholesterol levels are high. You are older than  43 years of age. You are at high risk for heart disease. What should I know about cancer screening? Depending on your health history and family history, you may need to have cancer screening at various ages. This may include screening for: Breast cancer. Cervical cancer. Colorectal cancer. Skin cancer. Lung cancer. What should I know about heart disease, diabetes, and high blood pressure? Blood pressure and heart disease High blood pressure causes heart disease and increases the risk of stroke. This is more likely to develop in people who have high blood pressure readings, are of African descent, or are overweight. Have your blood pressure checked: Every 3-5 years if you are 73-80 years of age. Every year if you are 48 years old or older. Diabetes Have regular diabetes screenings. This checks your fasting blood sugar level. Have the screening done: Once every three years after age 66 if you are at a normal weight and have a low risk for diabetes. More often and at a younger age if you are overweight or have a high risk for diabetes. What should I know about preventing infection? Hepatitis B If you have a higher risk for hepatitis B, you should be screened for this virus. Talk with your health care provider to find out if you are at risk forhepatitis B infection. Hepatitis C Testing is recommended for: Everyone born from [birth year] through 1963. Anyone with known risk factors for hepatitis C. Sexually transmitted infections (STIs) Get screened for STIs, including gonorrhea and chlamydia, if: You are sexually active and are younger than 43 years of age.  You are older than 43 years of age and your health care provider tells you that you are at risk for this type of infection. Your sexual activity has changed since you were last screened, and you are at increased risk for chlamydia or gonorrhea. Ask your health care provider if you are at risk. Ask your health care provider about whether you are  at high risk for HIV. Your health care provider may recommend a prescription medicine to help prevent HIV infection. If you choose to take medicine to prevent HIV, you should first get tested for HIV. You should then be tested every 3 months for as long as you are taking the medicine. Pregnancy If you are about to stop having your period (premenopausal) and you may become pregnant, seek counseling before you get pregnant. Take 400 to 800 micrograms (mcg) of folic acid every day if you become pregnant. Ask for birth control (contraception) if you want to prevent pregnancy. Osteoporosis and menopause Osteoporosis is a disease in which the bones lose minerals and strength with aging. This can result in bone fractures. If you are 41 years old or older, or if you are at risk for osteoporosis and fractures, ask your health care provider if you should: Be screened for bone loss. Take a calcium or vitamin D supplement to lower your risk of fractures. Be given hormone replacement therapy (HRT) to treat symptoms of menopause. Follow these instructions at home: Lifestyle Do not use any products that contain nicotine or tobacco, such as cigarettes, e-cigarettes, and chewing tobacco. If you need help quitting, ask your health care provider. Do not use street drugs. Do not share needles. Ask your health care provider for help if you need support or information about quitting drugs. Alcohol use Do not drink alcohol if: Your health care provider tells you not to drink. You are pregnant, may be pregnant, or are planning to become pregnant. If you drink alcohol: Limit how much you use to 0-1 drink a day. Limit intake if you are breastfeeding. Be aware of how much alcohol is in your drink. In the [place] one drink equals one 12 oz bottle of beer (355 mL), one 5 oz glass of wine (148 mL), or one 1 oz glass of hard liquor (44 mL). General instructions Schedule regular health, dental, and eye exams. Stay current  with your vaccines. Tell your health care provider if: You often feel depressed. You have ever been abused or do not feel safe at home. Summary Adopting a healthy lifestyle and getting preventive care are important in promoting health and wellness. Follow your health care provider's instructions about healthy diet, exercising, and getting tested or screened for diseases. Follow your health care provider's instructions on monitoring your cholesterol and blood pressure. This information is not intended to replace advice given to you by your health care provider. Make sure you discuss any questions you have with your healthcare provider. Document Revised: 04/30/2018 Document Reviewed: 04/30/2018 Elsevier Patient Education  2022 [employer].

## 2021-01-16 LAB — HEPATITIS C ANTIBODY
Hepatitis C Ab: NONREACTIVE
SIGNAL TO CUT-OFF: 0 (ref <1.00)

## 2021-04-16 ENCOUNTER — Other Ambulatory Visit: Payer: Self-pay | Admitting: Family Medicine

## 2021-04-16 DIAGNOSIS — F411 Generalized anxiety disorder: Secondary | ICD-10-CM

## 2021-04-17 NOTE — Telephone Encounter (Signed)
Please decline

## 2021-07-07 ENCOUNTER — Encounter: Payer: Self-pay | Admitting: Family Medicine

## 2021-07-07 ENCOUNTER — Other Ambulatory Visit: Payer: Self-pay

## 2021-07-07 ENCOUNTER — Ambulatory Visit: Payer: BC Managed Care – PPO | Admitting: Family Medicine

## 2021-07-07 VITALS — BP 100/65 | HR 81 | Temp 98.2°F | Ht 63.0 in | Wt 137.0 lb

## 2021-07-07 DIAGNOSIS — R000 Tachycardia, unspecified: Secondary | ICD-10-CM | POA: Diagnosis not present

## 2021-07-07 DIAGNOSIS — F5105 Insomnia due to other mental disorder: Secondary | ICD-10-CM | POA: Diagnosis not present

## 2021-07-07 DIAGNOSIS — F411 Generalized anxiety disorder: Secondary | ICD-10-CM | POA: Diagnosis not present

## 2021-07-07 DIAGNOSIS — Z79899 Other long term (current) drug therapy: Secondary | ICD-10-CM

## 2021-07-07 DIAGNOSIS — F99 Mental disorder, not otherwise specified: Secondary | ICD-10-CM

## 2021-07-07 MED ORDER — ESCITALOPRAM OXALATE 20 MG PO TABS: 20.0000 mg | ORAL_TABLET | Freq: Every day | ORAL | 1 refills | Status: DC

## 2021-07-07 MED ORDER — ALPRAZOLAM 0.25 MG PO TABS: 0.2500 mg | ORAL_TABLET | Freq: Every evening | ORAL | 1 refills | Status: DC | PRN

## 2021-07-07 MED ORDER — TRAZODONE HCL 300 MG PO TABS: 300.0000 mg | ORAL_TABLET | Freq: Every evening | ORAL | 1 refills | Status: DC | PRN

## 2021-07-07 MED ORDER — METOPROLOL SUCCINATE ER 25 MG PO TB24: ORAL_TABLET | ORAL | 1 refills | Status: DC

## 2021-07-07 NOTE — Progress Notes (Signed)
This visit occurred during the SARS-CoV-2 public health emergency.  Safety protocols were in place, including screening questions prior to the visit, additional usage of staff PPE, and extensive cleaning of exam room while observing appropriate contact time as indicated for disinfecting solutions.    Patient ID: Vickie Bennett, female  DOB: [birth year], 46 y.o.   MRN: [id number] Patient Care Team    Relationship Specialty Notifications Start End  Ma Hillock, DO PCP - General Family Medicine  12/24/14   Oliver Pila, MD Referring Physician Obstetrics and Gynecology  12/26/15     Chief Complaint  Patient presents with   Anxiety    Cmc; pt is not fasting    Subjective: Vickie Bennett is a 46 y.o.  Female  present for [facility] All past medical history, surgical history, allergies, family history, immunizations, medications and social history were updated in the electronic medical record today. All recent labs, ED visits and hospitalizations within the last year were reviewed.  Anxiety/insomnia/tachycardia She reports her tachycardia and palpitations are very well controlled on metoprolol 25 mg daily.  She reports compliance with trazodone 300 mg nightly, Lexapro 20 mg daily and Xanax 0.25 mg daily as needed. She feels this regimen is working well for her.   Depression screen [facility] 2/9 07/07/2021 01/13/2021 07/04/2020 01/01/2020 06/29/2019  Decreased Interest 0 0 0 0 0  Down, Depressed, Hopeless 0 0 0 0 0  PHQ - 2 Score 0 0 0 0 0  Altered sleeping 0 0 0 - -  Tired, decreased energy 1 0 0 - -  Change in appetite 1 1 0 - -  Feeling bad or failure about yourself  0 0 0 - -  Trouble concentrating 0 0 0 - -  Moving slowly or fidgety/restless 0 0 0 - -  Suicidal thoughts 0 0 0 - -  PHQ-9 Score 2 1 0 - -  Difficult doing work/chores - - - - -   GAD 7 : Generalized Anxiety Score 07/07/2021 01/13/2021 07/04/2020 06/29/2019  Nervous, Anxious, on Edge 0 1 1 0  Control/stop worrying 0 0 1 1  Worry  too much - different things 0 0 1 0  Trouble relaxing 0 0 1 1  Restless 0 0 0 0  Easily annoyed or irritable 1 0 1 1  Afraid - awful might happen 0 1 0 0  Total GAD 7 Score 1 2 5 3   Anxiety Difficulty - - - Not difficult at all    Immunization History  Administered Date(s) Administered   HPV 9-valent 08/22/2017   Influenza Split 03/07/2011, 03/12/2012   Influenza,inj,Quad PF,6+ Mos 05/04/2013, 03/02/2015, 05/01/2016, 05/01/2017, 04/02/2018, 01/13/2021   Influenza,inj,quad, With Preservative 03/01/2014   Influenza-Unspecified 04/20/2014   PFIZER(Purple Top)SARS-COV-2 Vaccination 01/02/2020, 01/27/2020   PPD Test 12/26/2015   Tdap 03/07/2011, 01/13/2021   Past Medical History:  Diagnosis Date   Anxiety    Hernia of anterior abdominal wall 06/20/2014   History of kidney stones    Hyperlipidemia    Tachycardia 04/21/2010   Qualifier: Diagnosis of  By: Charlett Blake MD, Stacey     Ureteral calculus, left    Allergies  Allergen Reactions   Sulfonamide Derivatives Nausea Only   Past Surgical History:  Procedure Laterality Date   CERVICAL BIOPSY  W/ LOOP ELECTRODE EXCISION  05-28-2003   dr Quincy Simmonds [facility]  10-12-2005  dr Jerilynn Mages. [place] [facility]   DILATION AND CURETTAGE OF UTERUS  06-06-2004  dr [facility]Harrington [facility]  w/ suction for missed ab   MANDIBLE SURGERY Bilateral age 32   b/l TMJ w/screws in place   Family History  Problem Relation Age of Onset   Hypertension Father    Arthritis Father    Migraines Mother    Interstitial cystitis Mother    Ulcers Maternal Grandmother        Peptic   Anemia Maternal Grandmother    Heart disease Maternal Grandfather    Heart disease Paternal Grandfather    Alzheimer's disease Paternal Grandmother    Other Paternal Grandmother        Possible PE   Social History   Social History Narrative   Not on file    Allergies as of 07/07/2021       Reactions   Sulfonamide Derivatives Nausea Only        Medication List        Accurate as of  July 07, 2021  4:44 PM. If you have any questions, ask your nurse or doctor.          ALPRAZolam 0.25 MG tablet Commonly known as: XANAX Take 1 tablet (0.25 mg total) by mouth at bedtime as needed for anxiety or sleep.   escitalopram 20 MG tablet Commonly known as: LEXAPRO Take 1 tablet (20 mg total) by mouth daily.   metoprolol succinate 25 MG 24 hr tablet Commonly known as: TOPROL-XL TAKE 1 TABLET (25 MG TOTAL) BY MOUTH DAILY.   trazodone 300 MG tablet Commonly known as: DESYREL Take 1 tablet (300 mg total) by mouth at bedtime as needed for sleep.        All past medical history, surgical history, allergies, family history, immunizations andmedications were updated in the EMR today and reviewed under the history and medication portions of their EMR.     No results found for this or any previous visit (from the past 2160 hour(s)).  MM 3D SCREEN BREAST BILATERAL   ROS: 14 pt review of systems performed and negative (unless mentioned in an HPI)  Objective: BP 100/65    Pulse 81    Temp 98.2 F (36.8 C) (Oral)    Ht 5\' 3"  (1.6 m)    Wt 137 lb (62.1 kg)    SpO2 100%    BMI 24.27 kg/m  Physical Exam Vitals and nursing note reviewed.  Constitutional:      General: She is not in acute distress.    Appearance: Normal appearance. She is not ill-appearing, toxic-appearing or diaphoretic.  HENT:     Head: Normocephalic and atraumatic.  Eyes:     General: No scleral icterus.       Right eye: No discharge.        Left eye: No discharge.     Extraocular Movements: Extraocular movements intact.     Conjunctiva/sclera: Conjunctivae normal.     Pupils: Pupils are equal, round, and reactive to light.  Skin:    General: Skin is warm and dry.     Coloration: Skin is not jaundiced or pale.     Findings: No erythema or rash.  Neurological:     Mental Status: She is alert and oriented to person, place, and time. Mental status is at baseline.     Motor: No weakness.     Gait:  Gait normal.  Psychiatric:        Mood and Affect: Mood normal.        Behavior: Behavior normal.        Thought Content: Thought  content normal.        Judgment: Judgment normal.     No results found.  Assessment/plan: ESHA FINCHER is a 46 y.o. female present for CPE/CMC Generalized anxiety disorder/insomnia/Benzodiazepine contract exists Stable. Continue Lexapro. - continue Xanax as prescribed.   [place] controlled substance database reviewed  07/07/21 - continue trazodone 300 mg nightly. -Follow-up in 5.5 mos.    Tachycardia Stable Continue metoprolol 25 mg daily     Return in about 6 months (around 01/14/2022) for CPE (30 min), CMC (30 min).  No orders of the defined types were placed in this encounter.  Meds ordered this encounter  Medications   escitalopram (LEXAPRO) 20 MG tablet    Sig: Take 1 tablet (20 mg total) by mouth daily.    Dispense:  90 tablet    Refill:  1   metoprolol succinate (TOPROL-XL) 25 MG 24 hr tablet    Sig: TAKE 1 TABLET (25 MG TOTAL) BY MOUTH DAILY.    Dispense:  90 tablet    Refill:  1   trazodone (DESYREL) 300 MG tablet    Sig: Take 1 tablet (300 mg total) by mouth at bedtime as needed for sleep.    Dispense:  90 tablet    Refill:  1   ALPRAZolam (XANAX) 0.25 MG tablet    Sig: Take 1 tablet (0.25 mg total) by mouth at bedtime as needed for anxiety or sleep.    Dispense:  90 tablet    Refill:  1   Referral Orders  No referral(s) requested today     Electronically signed by: Howard Pouch, [place]

## 2021-07-10 ENCOUNTER — Ambulatory Visit: Payer: BC Managed Care – PPO | Admitting: Family Medicine

## 2021-07-10 ENCOUNTER — Other Ambulatory Visit: Payer: Self-pay

## 2021-07-10 VITALS — BP 115/72 | HR 99 | Temp 98.0°F | Ht 63.0 in | Wt 133.0 lb

## 2021-07-10 DIAGNOSIS — L989 Disorder of the skin and subcutaneous tissue, unspecified: Secondary | ICD-10-CM

## 2021-07-10 NOTE — Progress Notes (Signed)
This visit occurred during the SARS-CoV-2 public health emergency.  Safety protocols were in place, including screening questions prior to the visit, additional usage of staff PPE, and extensive cleaning of exam room while observing appropriate contact time as indicated for disinfecting solutions.    Vickie Bennett , 07/22/1975, 46 y.o., female MRN: [id number] Patient Care Team    Relationship Specialty Notifications Start End  Ma Hillock, DO PCP - General Family Medicine  12/24/14   Oliver Pila, MD Referring Physician Obstetrics and Gynecology  12/26/15     Chief Complaint  Patient presents with   Nevus    Pt reports mole on R shoulder that began bleeding without any aggravation to area 3 days ago; no changes in shape,color or size     Subjective: Pt presents for an OV with complaints of bleeding skin lesion. She reports she noticed about a quarter size blood on her shirt over the weekend. She reports no pain with bleeding. Lesion has been present for years and unchanged other than the bleeding  No personal h/o of skin cancers. Mother with h/o melanoma on her back 2 years ago.   Depression screen [facility] 2/9 07/07/2021 01/13/2021 07/04/2020 01/01/2020 06/29/2019  Decreased Interest 0 0 0 0 0  Down, Depressed, Hopeless 0 0 0 0 0  PHQ - 2 Score 0 0 0 0 0  Altered sleeping 0 0 0 - -  Tired, decreased energy 1 0 0 - -  Change in appetite 1 1 0 - -  Feeling bad or failure about yourself  0 0 0 - -  Trouble concentrating 0 0 0 - -  Moving slowly or fidgety/restless 0 0 0 - -  Suicidal thoughts 0 0 0 - -  PHQ-9 Score 2 1 0 - -  Difficult doing work/chores - - - - -    Allergies  Allergen Reactions   Sulfonamide Derivatives Nausea Only   Social History   Social History Narrative   Not on file   Past Medical History:  Diagnosis Date   Anxiety    Hernia of anterior abdominal wall 06/20/2014   History of kidney stones    Hyperlipidemia    Tachycardia 04/21/2010    Qualifier: Diagnosis of  By: Charlett Blake MD, Stacey     Ureteral calculus, left    Past Surgical History:  Procedure Laterality Date   CERVICAL BIOPSY  W/ LOOP ELECTRODE EXCISION  05-28-2003   dr Quincy Simmonds [place]  10-12-2005  dr Jerilynn Mages. [place] [facility]   DILATION AND CURETTAGE OF UTERUS  06-06-2004  dr aHarrington [facility]   w/ suction for missed ab   MANDIBLE SURGERY Bilateral age 25   b/l TMJ w/screws in place   Family History  Problem Relation Age of Onset   Migraines Mother    Interstitial cystitis Mother    Melanoma Mother        back   Hypertension Father    Arthritis Father    Ulcers Maternal Grandmother        Peptic   Anemia Maternal Grandmother    Heart disease Maternal Grandfather    Alzheimer's disease Paternal Grandmother    Other Paternal Grandmother        Possible PE   Heart disease Paternal Grandfather    Allergies as of 07/10/2021       Reactions   Sulfonamide Derivatives Nausea Only        Medication List  Accurate as of July 10, 2021  9:45 AM. If you have any questions, ask your nurse or doctor.          ALPRAZolam 0.25 MG tablet Commonly known as: XANAX Take 1 tablet (0.25 mg total) by mouth at bedtime as needed for anxiety or sleep.   escitalopram 20 MG tablet Commonly known as: LEXAPRO Take 1 tablet (20 mg total) by mouth daily.   metoprolol succinate 25 MG 24 hr tablet Commonly known as: TOPROL-XL TAKE 1 TABLET (25 MG TOTAL) BY MOUTH DAILY.   trazodone 300 MG tablet Commonly known as: DESYREL Take 1 tablet (300 mg total) by mouth at bedtime as needed for sleep.        All past medical history, surgical history, allergies, family history, immunizations andmedications were updated in the EMR today and reviewed under the history and medication portions of their EMR.     ROS Negative, with the exception of above mentioned in HPI   Objective:  BP 115/72    Pulse 99    Temp 98 F (36.7 C) (Oral)    Ht 5\' 3"  (1.6 m)    Wt 133 lb  (60.3 kg)    SpO2 98%    BMI 23.56 kg/m  Body mass index is 23.56 kg/m.  Physical Exam Vitals and nursing note reviewed.  Constitutional:      General: She is not in acute distress.    Appearance: Normal appearance. She is normal weight. She is not ill-appearing or toxic-appearing.  Eyes:     Extraocular Movements: Extraocular movements intact.     Conjunctiva/sclera: Conjunctivae normal.     Pupils: Pupils are equal, round, and reactive to light.  Skin:    Findings: Lesion present. No bruising, erythema or rash.     Comments: 5x3 mm raised round nevi of uniform color with trauma. Partially lifted on the inferior portion.   Neurological:     Mental Status: She is alert and oriented to person, place, and time. Mental status is at baseline.  Psychiatric:        Mood and Affect: Mood normal.        Behavior: Behavior normal.        Thought Content: Thought content normal.        Judgment: Judgment normal.     No results found. No results found. No results found for this or any previous visit (from the past 24 hour(s)).  Assessment/Plan: Vickie Bennett is a 46 y.o. female present for OV for  Skin lesion Reassured. Lesion appears to be normal nevi that has been partially ripped off/traumatized on the inferior portion of lesion.  Continue to monitor and if nay color changes or growth would refer to derm. She can schedule a lesion removal here (1130) slot if desired.    Reviewed expectations re: course of current medical issues. Discussed self-management of symptoms. Outlined signs and symptoms indicating need for more acute intervention. Patient verbalized understanding and all questions were answered. Patient received an After-Visit Summary.    No orders of the defined types were placed in this encounter.  No orders of the defined types were placed in this encounter.  Referral Orders  No referral(s) requested today     Note is dictated utilizing voice recognition  software. Although note has been proof read prior to signing, occasional typographical errors still can be missed. If any questions arise, please do not hesitate to call for verification.   electronically signed by:  Howard Pouch,  DO  [facility] - [place]

## 2021-12-06 ENCOUNTER — Other Ambulatory Visit: Payer: Self-pay | Admitting: Family Medicine

## 2021-12-06 DIAGNOSIS — Z1231 Encounter for screening mammogram for malignant neoplasm of breast: Secondary | ICD-10-CM

## 2021-12-16 IMAGING — MG DIGITAL SCREENING BILAT W/ TOMO W/ CAD
8 series · 9 of 24 positions shown · non-contrast
Comparison: Previous exam(s).

CLINICAL DATA: Screening.

EXAM:
DIGITAL SCREENING BILATERAL MAMMOGRAM WITH TOMO AND CAD

[L CC synth-2D]
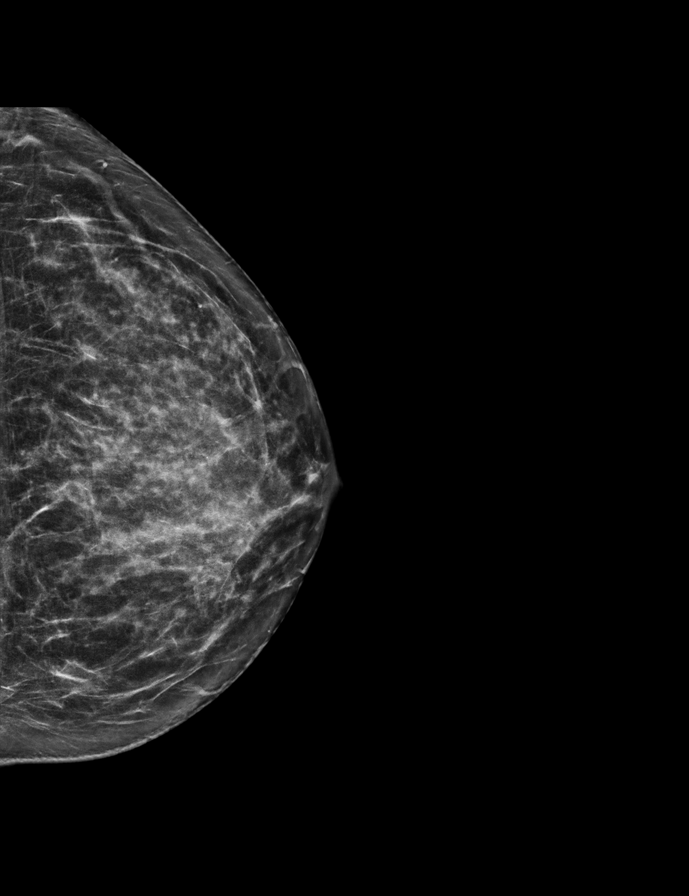

[R MLO synth-2D]
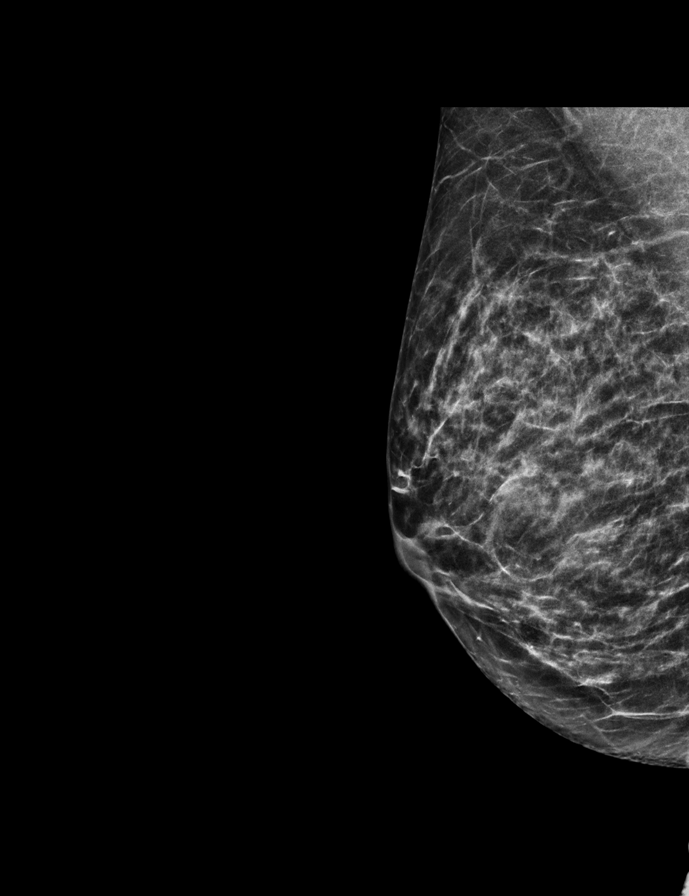

[R CC synth-2D]
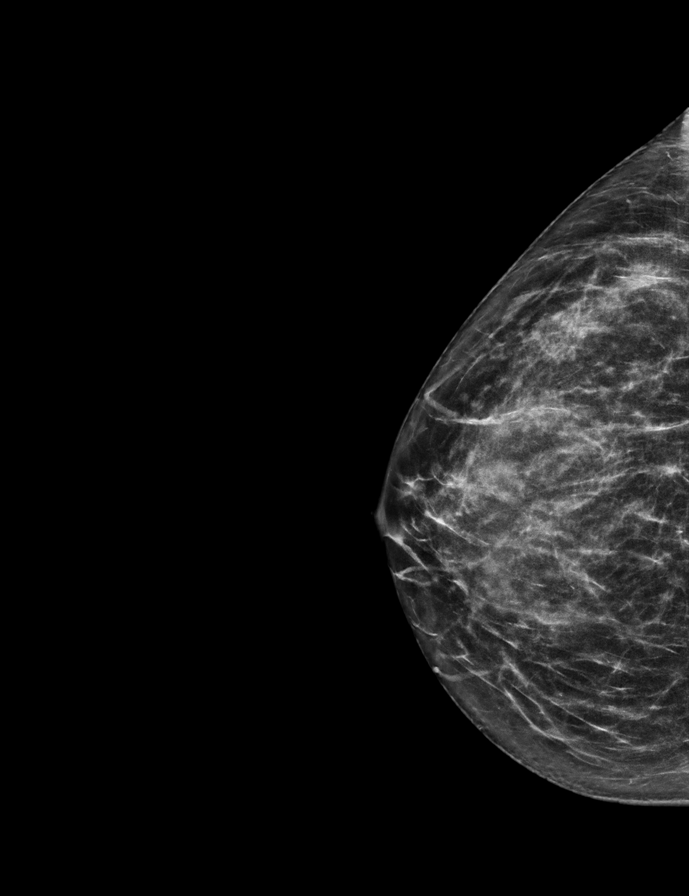

[L MLO synth-2D]
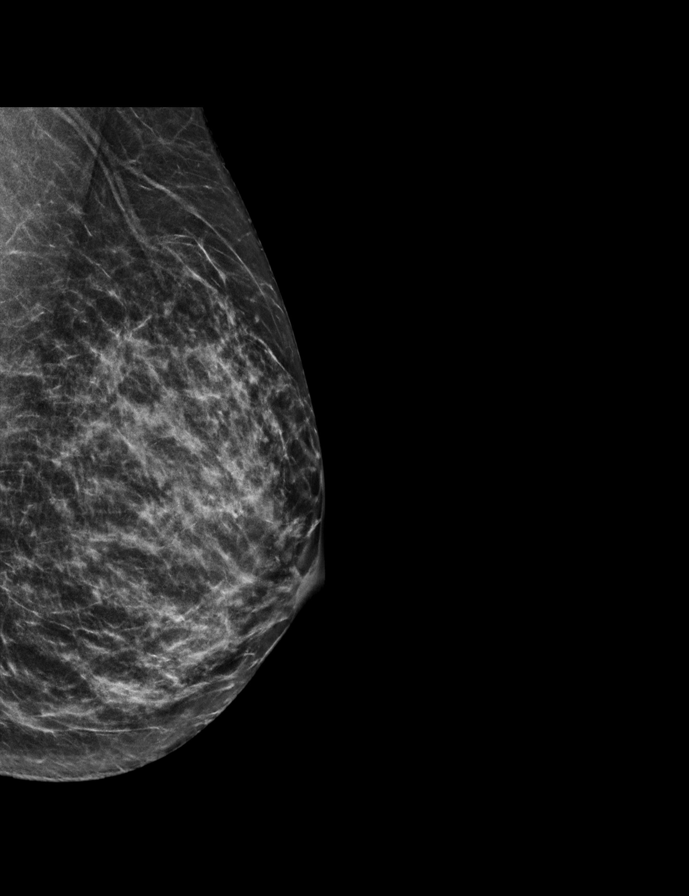

[R MLO tomo · 2 of 49 frames shown]
[frame 16/49]
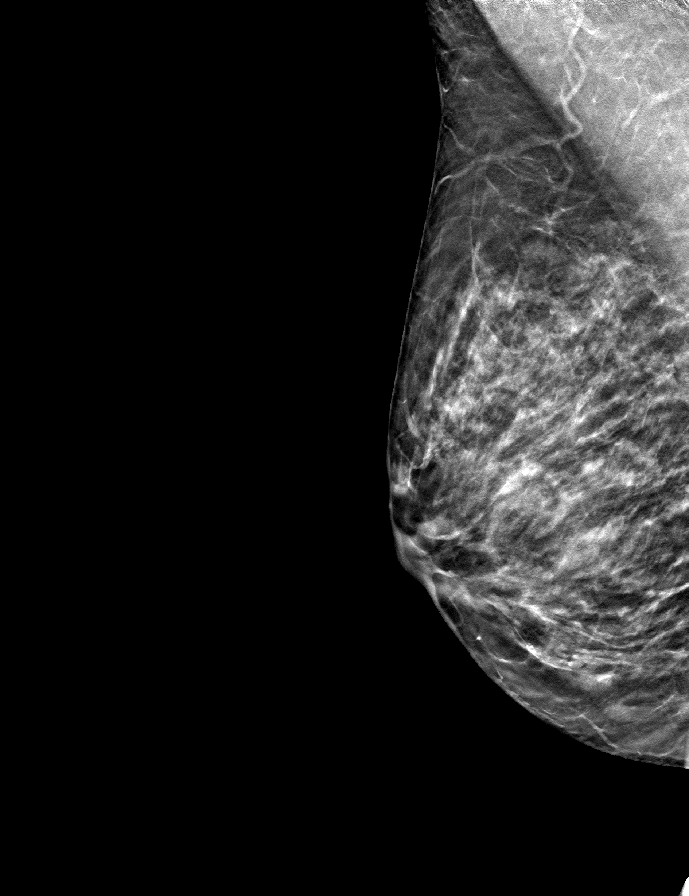
[frame 25/49]
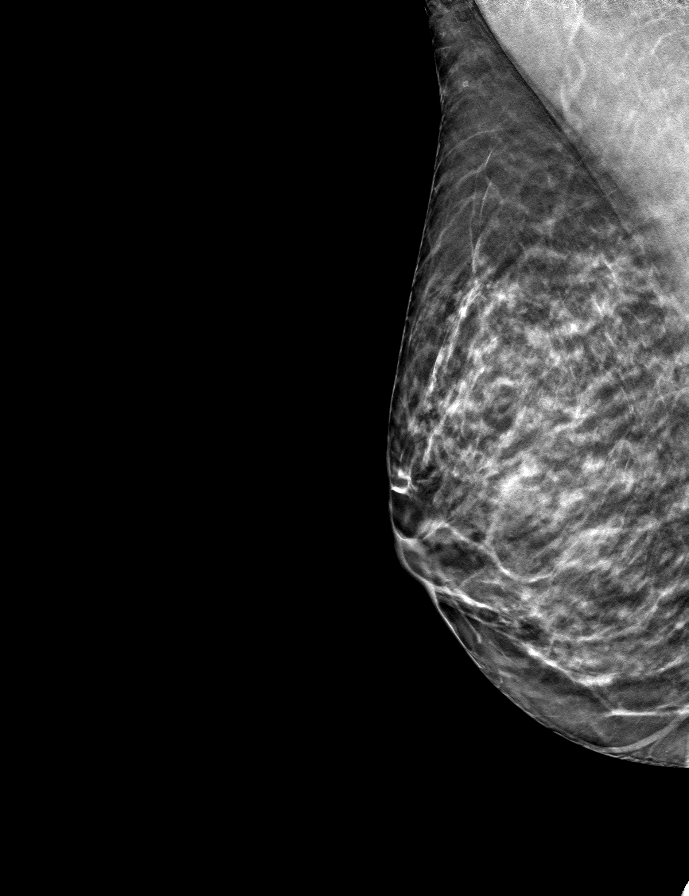

[R CC tomo · tomo slice 29/56.0]
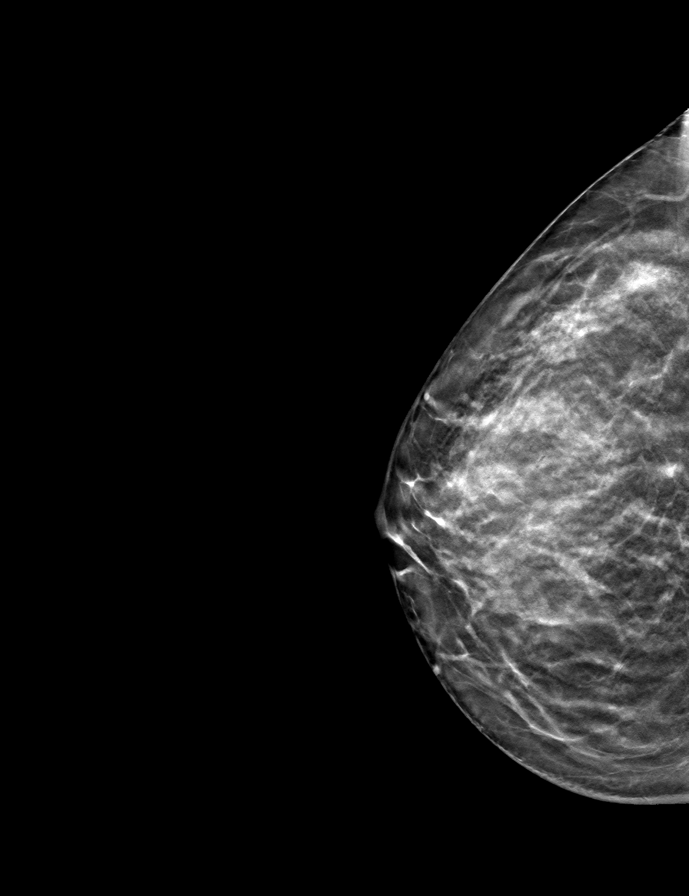

[L CC tomo · tomo slice 29/57.0]
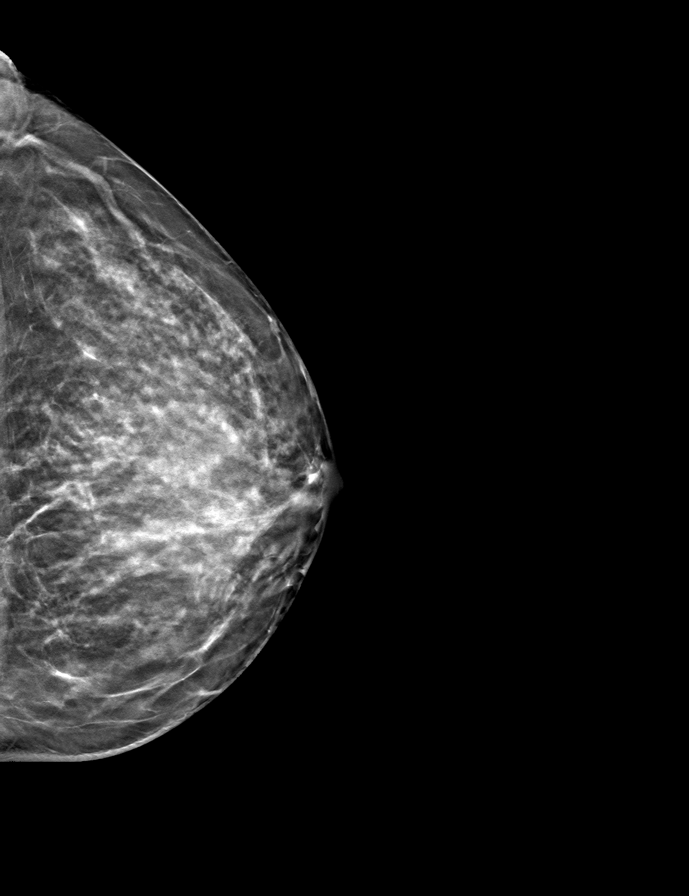

[L MLO tomo · tomo slice 27/53.0]
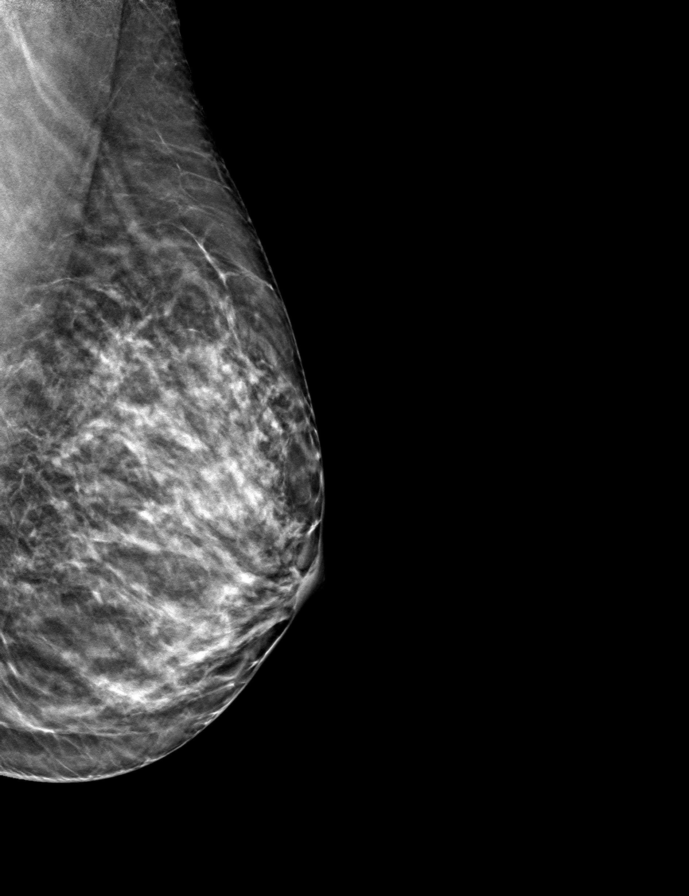

[9 of 24 positions shown; findings below may reference images not displayed]

ACR Breast Density Category c: The breast tissue is heterogeneously
dense, which may obscure small masses.
FINDINGS: There are no findings suspicious for malignancy. Images were
processed with CAD.
IMPRESSION: No mammographic evidence of malignancy. A result letter of this
screening mammogram will be mailed directly to the patient.

RECOMMENDATION:
Screening mammogram in one year. (Code:[id number])

BI-RADS CATEGORY  1: Negative.

## 2022-01-03 ENCOUNTER — Ambulatory Visit
Admission: RE | Admit: 2022-01-03 | Discharge: 2022-01-03 | Disposition: A | Discharge status: Home / Self Care | Payer: BC Managed Care – PPO | Source: Ambulatory Visit | Attending: Family Medicine | Admitting: Family Medicine

## 2022-01-03 DIAGNOSIS — Z1231 Encounter for screening mammogram for malignant neoplasm of breast: Secondary | ICD-10-CM

## 2022-01-04 ENCOUNTER — Encounter: Payer: Self-pay | Admitting: Family Medicine

## 2022-01-04 ENCOUNTER — Other Ambulatory Visit: Payer: Self-pay | Admitting: Family Medicine

## 2022-01-04 DIAGNOSIS — R928 Other abnormal and inconclusive findings on diagnostic imaging of breast: Secondary | ICD-10-CM

## 2022-01-04 NOTE — Telephone Encounter (Signed)
Please advise 

## 2022-01-05 ENCOUNTER — Ambulatory Visit
Admission: RE | Admit: 2022-01-05 | Discharge: 2022-01-05 | Disposition: A | Discharge status: Home / Self Care | Payer: BC Managed Care – PPO | Source: Ambulatory Visit | Attending: Family Medicine | Admitting: Family Medicine

## 2022-01-05 DIAGNOSIS — R928 Other abnormal and inconclusive findings on diagnostic imaging of breast: Secondary | ICD-10-CM

## 2022-01-12 ENCOUNTER — Other Ambulatory Visit: Payer: BC Managed Care – PPO

## 2022-01-16 ENCOUNTER — Encounter: Payer: Self-pay | Admitting: Family Medicine

## 2022-01-16 ENCOUNTER — Ambulatory Visit (INDEPENDENT_AMBULATORY_CARE_PROVIDER_SITE_OTHER): Payer: BC Managed Care – PPO | Admitting: Family Medicine

## 2022-01-16 VITALS — BP 89/62 | HR 96 | Temp 98.2°F | Ht 63.5 in | Wt 131.0 lb

## 2022-01-16 DIAGNOSIS — R000 Tachycardia, unspecified: Secondary | ICD-10-CM

## 2022-01-16 DIAGNOSIS — F5105 Insomnia due to other mental disorder: Secondary | ICD-10-CM | POA: Diagnosis not present

## 2022-01-16 DIAGNOSIS — R7989 Other specified abnormal findings of blood chemistry: Secondary | ICD-10-CM

## 2022-01-16 DIAGNOSIS — Z0000 Encounter for general adult medical examination without abnormal findings: Secondary | ICD-10-CM

## 2022-01-16 DIAGNOSIS — Z79899 Other long term (current) drug therapy: Secondary | ICD-10-CM

## 2022-01-16 DIAGNOSIS — F411 Generalized anxiety disorder: Secondary | ICD-10-CM

## 2022-01-16 DIAGNOSIS — F99 Mental disorder, not otherwise specified: Secondary | ICD-10-CM | POA: Diagnosis not present

## 2022-01-16 DIAGNOSIS — Z23 Encounter for immunization: Secondary | ICD-10-CM

## 2022-01-16 DIAGNOSIS — G4700 Insomnia, unspecified: Secondary | ICD-10-CM

## 2022-01-16 LAB — HEMOGLOBIN A1C: Hgb A1c MFr Bld: 5.4 % (ref 4.6–6.5)

## 2022-01-16 LAB — CBC
HCT: 43.6 % (ref 36.0–46.0)
Hemoglobin: 14.5 g/dL (ref 12.0–15.0)
MCHC: 33.2 g/dL (ref 30.0–36.0)
MCV: 98.3 fl (ref 78.0–100.0)
Platelets: 159 10*3/uL (ref 150.0–400.0)
RBC: 4.43 Mil/uL (ref 3.87–5.11)
RDW: 13.1 % (ref 11.5–15.5)
WBC: 4.1 10*3/uL (ref 4.0–10.5)

## 2022-01-16 LAB — TSH: TSH: 3.93 u[IU]/mL (ref 0.35–5.50)

## 2022-01-16 MED ORDER — TRAZODONE HCL 300 MG PO TABS: 300.0000 mg | ORAL_TABLET | Freq: Every evening | ORAL | 1 refills | Status: DC | PRN

## 2022-01-16 MED ORDER — ALPRAZOLAM 0.25 MG PO TABS: 0.2500 mg | ORAL_TABLET | Freq: Every evening | ORAL | 1 refills | Status: DC | PRN

## 2022-01-16 MED ORDER — METOPROLOL SUCCINATE ER 25 MG PO TB24
ORAL_TABLET | ORAL | 1 refills | Status: DC
Start: 2022-01-16 — End: 2022-09-24

## 2022-01-16 MED ORDER — ESCITALOPRAM OXALATE 20 MG PO TABS: 20.0000 mg | ORAL_TABLET | Freq: Every day | ORAL | 1 refills | Status: DC

## 2022-01-16 NOTE — Patient Instructions (Addendum)
Return in about 24 weeks (around 07/03/2022) for Routine chronic condition follow-up.        Great to see you today.  I have refilled the medication(s) we provide.   If labs were collected, we will inform you of lab results once received either by echart message or telephone call.   - echart message- for normal results that have been seen by the patient already.   - telephone call: abnormal results or if patient has not viewed results in their echart.   Health Maintenance, Female Adopting a healthy lifestyle and getting preventive care are important in promoting health and wellness. Ask your health care provider about: The right schedule for you to have regular tests and exams. Things you can do on your own to prevent diseases and keep yourself healthy. What should I know about diet, weight, and exercise? Eat a healthy diet  Eat a diet that includes plenty of vegetables, fruits, low-fat dairy products, and lean protein. Do not eat a lot of foods that are high in solid fats, added sugars, or sodium. Maintain a healthy weight Body mass index (BMI) is used to identify weight problems. It estimates body fat based on height and weight. Your health care provider can help determine your BMI and help you achieve or maintain a healthy weight. Get regular exercise Get regular exercise. This is one of the most important things you can do for your health. Most adults should: Exercise for at least 150 minutes each week. The exercise should increase your heart rate and make you sweat (moderate-intensity exercise). Do strengthening exercises at least twice a week. This is in addition to the moderate-intensity exercise. Spend less time sitting. Even light physical activity can be beneficial. Watch cholesterol and blood lipids Have your blood tested for lipids and cholesterol at 44 years of age, then have this test every 5 years. Have your cholesterol levels checked more often if: Your lipid or  cholesterol levels are high. You are older than 44 years of age. You are at high risk for heart disease. What should I know about cancer screening? Depending on your health history and family history, you may need to have cancer screening at various ages. This may include screening for: Breast cancer. Cervical cancer. Colorectal cancer. Skin cancer. Lung cancer. What should I know about heart disease, diabetes, and high blood pressure? Blood pressure and heart disease High blood pressure causes heart disease and increases the risk of stroke. This is more likely to develop in people who have high blood pressure readings or are overweight. Have your blood pressure checked: Every 3-5 years if you are 13-90 years of age. Every year if you are 36 years old or older. Diabetes Have regular diabetes screenings. This checks your fasting blood sugar level. Have the screening done: Once every three years after age 38 if you are at a normal weight and have a low risk for diabetes. More often and at a younger age if you are overweight or have a high risk for diabetes. What should I know about preventing infection? Hepatitis B If you have a higher risk for hepatitis B, you should be screened for this virus. Talk with your health care provider to find out if you are at risk for hepatitis B infection. Hepatitis C Testing is recommended for: Everyone born from [birth year] through 1965. Anyone with known risk factors for hepatitis C. Sexually transmitted infections (STIs) Get screened for STIs, including gonorrhea and chlamydia, if: You are sexually active and  are younger than 44 years of age. You are older than 44 years of age and your health care provider tells you that you are at risk for this type of infection. Your sexual activity has changed since you were last screened, and you are at increased risk for chlamydia or gonorrhea. Ask your health care provider if you are at risk. Ask your health care  provider about whether you are at high risk for HIV. Your health care provider may recommend a prescription medicine to help prevent HIV infection. If you choose to take medicine to prevent HIV, you should first get tested for HIV. You should then be tested every 3 months for as long as you are taking the medicine. Pregnancy If you are about to stop having your period (premenopausal) and you may become pregnant, seek counseling before you get pregnant. Take 400 to 800 micrograms (mcg) of folic acid every day if you become pregnant. Ask for birth control (contraception) if you want to prevent pregnancy. Osteoporosis and menopause Osteoporosis is a disease in which the bones lose minerals and strength with aging. This can result in bone fractures. If you are 9 years old or older, or if you are at risk for osteoporosis and fractures, ask your health care provider if you should: Be screened for bone loss. Take a calcium or vitamin D supplement to lower your risk of fractures. Be given hormone replacement therapy (HRT) to treat symptoms of menopause. Follow these instructions at home: Alcohol use Do not drink alcohol if: Your health care provider tells you not to drink. You are pregnant, may be pregnant, or are planning to become pregnant. If you drink alcohol: Limit how much you have to: 0-1 drink a day. Know how much alcohol is in your drink. In the [place] one drink equals one 12 oz bottle of beer (355 mL), one 5 oz glass of wine (148 mL), or one 1 oz glass of hard liquor (44 mL). Lifestyle Do not use any products that contain nicotine or tobacco. These products include cigarettes, chewing tobacco, and vaping devices, such as e-cigarettes. If you need help quitting, ask your health care provider. Do not use street drugs. Do not share needles. Ask your health care provider for help if you need support or information about quitting drugs. General instructions Schedule regular health, dental, and  eye exams. Stay current with your vaccines. Tell your health care provider if: You often feel depressed. You have ever been abused or do not feel safe at home. Summary Adopting a healthy lifestyle and getting preventive care are important in promoting health and wellness. Follow your health care provider's instructions about healthy diet, exercising, and getting tested or screened for diseases. Follow your health care provider's instructions on monitoring your cholesterol and blood pressure. This information is not intended to replace advice given to you by your health care provider. Make sure you discuss any questions you have with your health care provider. Document Revised: 09/26/2020 Document Reviewed: 09/26/2020 Elsevier Patient Education  [place].

## 2022-01-16 NOTE — Progress Notes (Signed)
Patient ID: Vickie Bennett, female  DOB: [birth year], 46 y.o.   MRN: [id number] Patient Care Team    Relationship Specialty Notifications Start End  Ma Hillock, DO PCP - General Family Medicine  12/24/14   Oliver Pila, MD Referring Physician Obstetrics and Gynecology  12/26/15     Chief Complaint  Patient presents with   Annual Exam    Pt is fasting    Subjective: Vickie Bennett is a 46 y.o.  Female  present for CPE/CMC. All past medical history, surgical history, allergies, family history, immunizations, medications and social history were updated in the electronic medical record today. All recent labs, ED visits and hospitalizations within the last year were reviewed.  Health maintenance:  Colonoscopy: No fhx, screen at 47 Mammogram: completed 01/05/2022-abnormal mammogram-followed by normal repeat and ultrasound.  Recommendations is to return to yearly screening. Cervical cancer screening: last pap:10/24/2017, results: Normal, neg co test completed by:Dr.  Quincy Simmonds Immunizations: tdap UTD 2022, Influenza completed today(encouraged yearly),COVID completed  Infectious disease screening: HIV completed , Hep C ordered DEXA: per routine screen Assistive device: None Oxygen use: None Patient has a Dental home. Hospitalizations/ED visits: Reviewed  tachycardia She reports her tachycardia and palpitations are well controlled  controlled on metoprolol 25 mg daily.   Anxiety/insomnia She reports compliance with trazodone 300 mg nightly, Lexapro 20 mg daily and Xanax 0.25 mg daily as needed.  She is happy with results of regimen     01/16/2022    8:13 AM 07/07/2021    3:19 PM 01/13/2021   10:00 AM 07/04/2020    2:12 PM 01/01/2020    9:52 AM  Depression screen PHQ 2/9  Decreased Interest 0 0 0 0 0  Down, Depressed, Hopeless 0 0 0 0 0  PHQ - 2 Score 0 0 0 0 0  Altered sleeping  0 0 0   Tired, decreased energy  1 0 0   Change in appetite  1 1 0   Feeling bad or failure about  yourself   0 0 0   Trouble concentrating  0 0 0   Moving slowly or fidgety/restless  0 0 0   Suicidal thoughts  0 0 0   PHQ-9 Score  2 1 0       07/07/2021    3:19 PM 01/13/2021   10:00 AM 07/04/2020    2:13 PM 06/29/2019    1:27 PM  GAD 7 : Generalized Anxiety Score  Nervous, Anxious, on Edge 0 1 1 0  Control/stop worrying 0 0 1 1  Worry too much - different things 0 0 1 0  Trouble relaxing 0 0 1 1  Restless 0 0 0 0  Easily annoyed or irritable 1 0 1 1  Afraid - awful might happen 0 1 0 0  Total GAD 7 Score '1 2 5 3  '$ Anxiety Difficulty    Not difficult at all           Immunization History  Administered Date(s) Administered   HPV 9-valent 08/22/2017, 01/16/2022   Influenza Split 03/07/2011, 03/12/2012   Influenza,inj,Quad PF,6+ Mos 05/04/2013, 03/02/2015, 05/01/2016, 05/01/2017, 04/02/2018, 01/13/2021, 01/16/2022   Influenza,inj,quad, With Preservative 03/01/2014   Influenza-Unspecified 04/20/2014   PFIZER(Purple Top)SARS-COV-2 Vaccination 01/02/2020, 01/27/2020   PPD Test 12/26/2015   Tdap 03/07/2011, 01/13/2021     Past Medical History:  Diagnosis Date   Anxiety    Hernia of anterior abdominal wall 06/20/2014   History of kidney stones  Hyperlipidemia    Tachycardia 04/21/2010   Qualifier: Diagnosis of  By: Charlett Blake MD, [place]     Ureteral calculus, left    Allergies  Allergen Reactions   Sulfonamide Derivatives Nausea Only   Past Surgical History:  Procedure Laterality Date   CERVICAL BIOPSY  W/ LOOP ELECTRODE EXCISION  05-28-2003   dr Quincy Simmonds [place]  10-12-2005  dr Jerilynn Mages. [place] [facility] [facility]   DILATION AND CURETTAGE OF UTERUS  06-06-2004  dr aHarrington Challenger [facility]   w/ suction for missed ab   MANDIBLE SURGERY Bilateral age 61   b/l TMJ w/screws in place   Family History  Problem Relation Age of Onset   Migraines Mother    Interstitial cystitis Mother    Melanoma Mother        back   Hypertension Father    Arthritis Father    Ulcers Maternal Grandmother         Peptic   Anemia Maternal Grandmother    Heart disease Maternal Grandfather    Alzheimer's disease Paternal Grandmother    Other Paternal Grandmother        Possible PE   Heart disease Paternal Grandfather    Social History   Social History Narrative   Not on file    Allergies as of 01/16/2022       Reactions   Sulfonamide Derivatives Nausea Only        Medication List        Accurate as of January 16, 2022  9:31 AM. If you have any questions, ask your nurse or doctor.          ALPRAZolam 0.25 MG tablet Commonly known as: XANAX Take 1 tablet (0.25 mg total) by mouth at bedtime as needed for anxiety or sleep.   escitalopram 20 MG tablet Commonly known as: LEXAPRO Take 1 tablet (20 mg total) by mouth daily.   metoprolol succinate 25 MG 24 hr tablet Commonly known as: TOPROL-XL TAKE 1 TABLET (25 MG TOTAL) BY MOUTH DAILY.   trazodone 300 MG tablet Commonly known as: DESYREL Take 1 tablet (300 mg total) by mouth at bedtime as needed for sleep.        All past medical history, surgical history, allergies, family history, immunizations andmedications were updated in the EMR today and reviewed under the history and medication portions of their EMR.     No results found for this or any previous visit (from the past 2160 hour(s)).  MM DIAG BREAST TOMO UNI LEFT Result Date: 01/05/2022 LEFT BREAST LIMITED TECHNIQUE: Left digital diagnostic  BI-RADS CATEGORY  2: Benign. Electronically Signed   By: Dorise Bullion III M.D.   On: 01/05/2022 13:26  US BREAST LTD UNI LEFT INC AXILLA Result Date: 01/05/2022 malignancy. RECOMMENDATION: Annual screening mammography. I have discussed the findings and recommendations with the patient. If applicable, a reminder letter will be sent to the patient regarding the next appointment. BI-RADS CATEGORY  2: Benign.     ROS 14 pt review of systems performed and negative (unless mentioned in an HPI)  Objective: BP (!) 89/62   Pulse 96    Temp 98.2 F (36.8 C) (Oral)   Ht 5' 3.5" (1.613 m)   Wt 131 lb (59.4 kg)   LMP 01/03/2022   SpO2 99%   BMI 22.84 kg/m  Physical Exam Vitals and nursing note reviewed.  Constitutional:      General: She is not in acute distress.    Appearance: Normal appearance.  She is not ill-appearing or toxic-appearing.  HENT:     Head: Normocephalic and atraumatic.     Right Ear: Tympanic membrane, ear canal and external ear normal. There is no impacted cerumen.     Left Ear: Tympanic membrane, ear canal and external ear normal. There is no impacted cerumen.     Nose: No congestion or rhinorrhea.     Mouth/Throat:     Mouth: Mucous membranes are moist.     Pharynx: Oropharynx is clear. No oropharyngeal exudate or posterior oropharyngeal erythema.  Eyes:     General: No scleral icterus.       Right eye: No discharge.        Left eye: No discharge.     Extraocular Movements: Extraocular movements intact.     Conjunctiva/sclera: Conjunctivae normal.     Pupils: Pupils are equal, round, and reactive to light.  Cardiovascular:     Rate and Rhythm: Normal rate and regular rhythm.     Pulses: Normal pulses.     Heart sounds: Normal heart sounds. No murmur heard.    No friction rub. No gallop.  Pulmonary:     Effort: Pulmonary effort is normal. No respiratory distress.     Breath sounds: Normal breath sounds. No stridor. No wheezing, rhonchi or rales.  Chest:     Chest wall: No tenderness.  Abdominal:     General: Abdomen is flat. Bowel sounds are normal. There is no distension.     Palpations: Abdomen is soft. There is no mass.     Tenderness: There is no abdominal tenderness. There is no right CVA tenderness, left CVA tenderness, guarding or rebound.     Hernia: No hernia is present.  Musculoskeletal:        General: No swelling, tenderness or deformity. Normal range of motion.     Cervical back: Normal range of motion and neck supple. No rigidity or tenderness.     Right lower leg: No  edema.     Left lower leg: No edema.  Lymphadenopathy:     Cervical: No cervical adenopathy.  Skin:    General: Skin is warm and dry.     Coloration: Skin is not jaundiced or pale.     Findings: No bruising, erythema, lesion or rash.  Neurological:     General: No focal deficit present.     Mental Status: She is alert and oriented to person, place, and time. Mental status is at baseline.     Cranial Nerves: No cranial nerve deficit.     Sensory: No sensory deficit.     Motor: No weakness.     Coordination: Coordination normal.     Gait: Gait normal.     Deep Tendon Reflexes: Reflexes normal.  Psychiatric:        Mood and Affect: Mood normal.        Behavior: Behavior normal.        Thought Content: Thought content normal.        Judgment: Judgment normal.        No results found.  Assessment/plan: KYLEA BERRONG is a 46 y.o. female present for CPE/CMC Generalized anxiety disorder/insomnia/Benzodiazepine contract exists Stable Continue Lexapro 20 mg daily Continue Xanax daily as needed Continue trazodone 300 mg nightly [place] controlled substance database reviewed  01/16/22   Tachycardia Stable Continue metoprolol 25 mg daily  Cbc, cmp thsh, lipids Elevated TSH - Lipid panel - TSH Encounter for long-term current use of medication - Hemoglobin  A1c Need for immunization against influenza - Flu Vaccine QUAD 6+ mos PF IM (Fluarix Quad PF) Need for HPV vaccination - HPV 9-valent vaccine,Recombinat - nurse visit affter Thanksgiving 2023 for HPV #3 Routine general medical examination at a health care facility Colonoscopy: No fhx, screen at 47 Mammogram: completed 01/05/2022-abnormal mammogram-followed by normal repeat and ultrasound.  Recommendations is to return to yearly screening. Cervical cancer screening: last pap:10/24/2017, results: Normal, neg co test completed by:Dr.  Quincy Simmonds Immunizations: tdap UTD 2022, Influenza completed today(encouraged yearly),COVID  completed  Infectious disease screening: HIV completed , Hep C ordered DEXA: per routine screen Patient was encouraged to exercise greater than 150 minutes a week. Patient was encouraged to choose a diet filled with fresh fruits and vegetables, and lean meats. AVS provided to patient today for education/recommendation on gender specific health and safety maintenance.  Return in about 24 weeks (around 07/03/2022) for Routine chronic condition follow-up.  Orders Placed This Encounter  Procedures   Flu Vaccine QUAD 6+ mos PF IM (Fluarix Quad PF)   HPV 9-valent vaccine,Recombinat   CBC   Comprehensive metabolic panel   Hemoglobin A1c   Lipid panel   TSH     Meds ordered this encounter  Medications   escitalopram (LEXAPRO) 20 MG tablet    Sig: Take 1 tablet (20 mg total) by mouth daily.    Dispense:  90 tablet    Refill:  1   metoprolol succinate (TOPROL-XL) 25 MG 24 hr tablet    Sig: TAKE 1 TABLET (25 MG TOTAL) BY MOUTH DAILY.    Dispense:  90 tablet    Refill:  1   trazodone (DESYREL) 300 MG tablet    Sig: Take 1 tablet (300 mg total) by mouth at bedtime as needed for sleep.    Dispense:  90 tablet    Refill:  1   ALPRAZolam (XANAX) 0.25 MG tablet    Sig: Take 1 tablet (0.25 mg total) by mouth at bedtime as needed for anxiety or sleep.    Dispense:  90 tablet    Refill:  1   Referral Orders  No referral(s) requested today     Electronically signed by: Howard Pouch, [place]

## 2022-01-17 LAB — LIPID PANEL
Cholesterol: 144 mg/dL (ref 0–200)
HDL: 81.7 mg/dL (ref >39.00)
LDL Cholesterol: 47 mg/dL (ref 0–99)
NonHDL: 61.9
Total CHOL/HDL Ratio: 2
Triglycerides: 76 mg/dL (ref 0.0–149.0)
VLDL: 15.2 mg/dL (ref 0.0–40.0)

## 2022-01-17 LAB — COMPREHENSIVE METABOLIC PANEL
ALT: 14 U/L (ref 0–35)
AST: 13 U/L (ref 0–37)
Albumin: 4.3 g/dL (ref 3.5–5.2)
Alkaline Phosphatase: 57 U/L (ref 39–117)
BUN: 11 mg/dL (ref 6–23)
CO2: 25 mEq/L (ref 19–32)
Calcium: 9.2 mg/dL (ref 8.4–10.5)
Chloride: 105 mEq/L (ref 96–112)
Creatinine, Ser: 0.91 mg/dL (ref 0.40–1.20)
GFR: 76.93 mL/min (ref >60.00)
Glucose, Bld: 89 mg/dL (ref 70–99)
Potassium: 4.2 mEq/L (ref 3.5–5.1)
Sodium: 140 mEq/L (ref 135–145)
Total Bilirubin: 0.6 mg/dL (ref 0.2–1.2)
Total Protein: 6.6 g/dL (ref 6.0–8.3)

## 2022-02-05 ENCOUNTER — Encounter: Payer: Self-pay | Admitting: Family Medicine

## 2022-02-05 ENCOUNTER — Ambulatory Visit: Payer: BC Managed Care – PPO | Admitting: Family Medicine

## 2022-02-05 VITALS — BP 107/69 | HR 79 | Temp 98.0°F | Ht 63.5 in | Wt 136.0 lb

## 2022-02-05 DIAGNOSIS — H01119 Allergic dermatitis of unspecified eye, unspecified eyelid: Secondary | ICD-10-CM | POA: Insufficient documentation

## 2022-02-05 MED ORDER — PREDNISONE 20 MG PO TABS: 20.0000 mg | ORAL_TABLET | Freq: Every day | ORAL | 0 refills | Status: DC

## 2022-02-05 MED ORDER — TRIAMCINOLONE ACETONIDE 0.1 % EX CREA: 1.0000 | TOPICAL_CREAM | Freq: Two times a day (BID) | CUTANEOUS | 1 refills | Status: DC

## 2022-02-05 NOTE — Progress Notes (Signed)
Vickie Bennett , March 14, 1976, 46 y.o., female MRN: [id number] Patient Care Team    Relationship Specialty Notifications Start End  Ma Hillock, DO PCP - General Family Medicine  12/24/14   Oliver Pila, MD Referring Physician Obstetrics and Gynecology  12/26/15     Chief Complaint  Patient presents with   Facial Swelling    Pt c/o b/l eye swelling x 1 week and gradually worsening      Subjective: Pt presents for an OV with complaints of bilateral upper and lower eye dry flaky itchy skin and swelling.  She reports she was at the beach and when she returned she noticed her eyes started to bother her.  She denies any redness or itchiness, no drainage of her eye.  Condition is affecting the skin of her upper and lower eyelid.  She tried to put facial cream on her eyes but it burned.  She denies any change in personal hygiene products, with the exception of attempting to use a coconut oil for the first time in a long time.  She states it is the first time she used it in quite some time, especially on her eyes.  She has never had similar condition in the past.     01/16/2022    8:13 AM 07/07/2021    3:19 PM 01/13/2021   10:00 AM 07/04/2020    2:12 PM 01/01/2020    9:52 AM  Depression screen PHQ 2/9  Decreased Interest 0 0 0 0 0  Down, Depressed, Hopeless 0 0 0 0 0  PHQ - 2 Score 0 0 0 0 0  Altered sleeping  0 0 0   Tired, decreased energy  1 0 0   Change in appetite  1 1 0   Feeling bad or failure about yourself   0 0 0   Trouble concentrating  0 0 0   Moving slowly or fidgety/restless  0 0 0   Suicidal thoughts  0 0 0   PHQ-9 Score  2 1 0     Allergies  Allergen Reactions   Sulfonamide Derivatives Nausea Only   Social History   Social History Narrative   Not on file   Past Medical History:  Diagnosis Date   Anxiety    Hernia of anterior abdominal wall 06/20/2014   History of kidney stones    Hyperlipidemia    Tachycardia 04/21/2010   Qualifier: Diagnosis of   By: Charlett Blake MD, [place]     Ureteral calculus, left    Past Surgical History:  Procedure Laterality Date   CERVICAL BIOPSY  W/ LOOP ELECTRODE EXCISION  05-28-2003   dr Quincy Simmonds [place]  10-12-2005  dr Jerilynn [facility]ages. [place] [facility]   DILATION AND CURETTAGE OF UTERUS  06-06-2004  dr aHarrington Challenger [facility]   w/ suction for missed ab   MANDIBLE SURGERY Bilateral age 59   b/l TMJ w/screws in place   Family History  Problem Relation Age of Onset   Migraines Mother    Interstitial cystitis Mother    Melanoma Mother        back   Hypertension Father    Arthritis Father    Ulcers Maternal Grandmother        Peptic   Anemia Maternal Grandmother    Heart disease Maternal Grandfather    Alzheimer's disease Paternal Grandmother    Other Paternal Grandmother        Possible PE   Heart disease  Paternal Grandfather    Allergies as of 02/05/2022       Reactions   Sulfonamide Derivatives Nausea Only        Medication List        Accurate as of February 05, 2022 11:24 AM. If you have any questions, ask your nurse or doctor.          ALPRAZolam 0.25 MG tablet Commonly known as: XANAX Take 1 tablet (0.25 mg total) by mouth at bedtime as needed for anxiety or sleep.   escitalopram 20 MG tablet Commonly known as: LEXAPRO Take 1 tablet (20 mg total) by mouth daily.   metoprolol succinate 25 MG 24 hr tablet Commonly known as: TOPROL-XL TAKE 1 TABLET (25 MG TOTAL) BY MOUTH DAILY.   predniSONE 20 MG tablet Commonly known as: DELTASONE Take 1 tablet (20 mg total) by mouth daily with breakfast. Started by: Howard Pouch, DO   trazodone 300 MG tablet Commonly known as: DESYREL Take 1 tablet (300 mg total) by mouth at bedtime as needed for sleep.   triamcinolone cream 0.1 % Commonly known as: KENALOG Apply 1 Application topically 2 (two) times daily. Started by: Howard Pouch, DO        All past medical history, surgical history, allergies, family history, immunizations andmedications were  updated in the EMR today and reviewed under the history and medication portions of their EMR.     ROS Negative, with the exception of above mentioned in HPI   Objective:  BP 107/69   Pulse 79   Temp 98 F (36.7 C) (Oral)   Ht 5' 3.5" (1.613 m)   Wt 136 lb (61.7 kg)   LMP 01/31/2022   SpO2 99%   BMI 23.71 kg/m  Body mass index is 23.71 kg/m. Physical Exam Vitals and nursing note reviewed.  Constitutional:      General: She is not in acute distress.    Appearance: Normal appearance. She is normal weight. She is not ill-appearing or toxic-appearing.  Eyes:     Extraocular Movements: Extraocular movements intact.     Conjunctiva/sclera: Conjunctivae normal.     Pupils: Pupils are equal, round, and reactive to light.  Skin:    Findings: Rash present.     Comments: Bilateral upper and lower eyelid dry, scaliness.  Swelling present lower eyelid.  Neurological:     Mental Status: She is alert and oriented to person, place, and time. Mental status is at baseline.  Psychiatric:        Mood and Affect: Mood normal.        Behavior: Behavior normal.        Thought Content: Thought content normal.        Judgment: Judgment normal.     No results found. No results found. No results found for this or any previous visit (from the past 24 hour(s)).  Assessment/Plan: Vickie Bennett is a 46 y.o. female present for OV for  Eyelid dermatitis, allergic/contact Bilateral eyelid dermatitis present on exam today. She will avoid any usage of make-up or over-the-counter products until completely resolved. Recommend avoid the coconut oil application in the future, since is the only thing she remembers changing recently. Gentle warm cloth daily cleansing. Kenalog cream twice daily.  Precautions discussed. Prednisone taper x10 days. BB ointment nightly  Reviewed expectations re: course of current medical issues. Discussed self-management of symptoms. Outlined signs and symptoms  indicating need for more acute intervention. Patient verbalized understanding and all questions were answered. Patient received  an Scientific laboratory technician.    No orders of the defined types were placed in this encounter.  Meds ordered this encounter  Medications   triamcinolone cream (KENALOG) 0.1 %    Sig: Apply 1 Application topically 2 (two) times daily.    Dispense:  30 g    Refill:  1   predniSONE (DELTASONE) 20 MG tablet    Sig: Take 1 tablet (20 mg total) by mouth daily with breakfast.    Dispense:  18 tablet    Refill:  0   Referral Orders  No referral(s) requested today     Note is dictated utilizing voice recognition software. Although note has been proof read prior to signing, occasional typographical errors still can be missed. If any questions arise, please do not hesitate to call for verification.   electronically signed by:  Howard Pouch, DO  [place]

## 2022-02-05 NOTE — Patient Instructions (Signed)
Kenalog cream twice a day, sparingly.  Bag balm at night Gentle cleanse with warm water.

## 2022-04-02 ENCOUNTER — Ambulatory Visit: Payer: BC Managed Care – PPO | Admitting: Family Medicine

## 2022-04-02 ENCOUNTER — Encounter: Payer: Self-pay | Admitting: Family Medicine

## 2022-04-02 VITALS — BP 104/72 | HR 80 | Temp 98.1°F | Wt 138.0 lb

## 2022-04-02 DIAGNOSIS — S39012A Strain of muscle, fascia and tendon of lower back, initial encounter: Secondary | ICD-10-CM

## 2022-04-02 MED ORDER — HYDROCODONE-ACETAMINOPHEN 5-325 MG PO TABS: 1.0000 | ORAL_TABLET | Freq: Three times a day (TID) | ORAL | 0 refills | Status: AC

## 2022-04-02 MED ORDER — HYDROCODONE-ACETAMINOPHEN 5-325 MG PO TABS: 1.0000 | ORAL_TABLET | Freq: Three times a day (TID) | ORAL | 0 refills | Status: DC

## 2022-04-02 MED ORDER — PREDNISONE 20 MG PO TABS: ORAL_TABLET | ORAL | 0 refills | Status: DC

## 2022-04-02 MED ORDER — CYCLOBENZAPRINE HCL 10 MG PO TABS: 5.0000 mg | ORAL_TABLET | Freq: Three times a day (TID) | ORAL | 0 refills | Status: DC | PRN

## 2022-04-02 MED ORDER — METHYLPREDNISOLONE ACETATE 80 MG/ML IJ SUSP
80.0000 mg | Freq: Once | INTRAMUSCULAR | Status: AC
  Administered 2022-04-02: 80 mg via INTRAMUSCULAR

## 2022-04-02 NOTE — Progress Notes (Signed)
Vickie Bennett , 1975/10/28, 46 y.o., female MRN: [id number] Patient Care Team    Relationship Specialty Notifications Start End  Ma Hillock, DO PCP - General Family Medicine  12/24/14   Oliver Pila, MD Referring Physician Obstetrics and Gynecology  12/26/15     Chief Complaint  Patient presents with   Back Pain    Started yesterday     Subjective: Pt presents for an OV with complaints of bilateral low back pain  of 24hr duration.  Associated symptoms include fastening and sharp shooting pain across the lower back.  She denies radiation of pain down her legs.  She denies numbness or tingling distally.  Bladder and bowel function is normal. Pt has tried, ice, ibuprofen and lidocaine patch to ease their symptoms.  She had a similar episode about a year and a half ago.  Resolved with muscle relaxants and steroids.     01/16/2022    8:13 AM 07/07/2021    3:19 PM 01/13/2021   10:00 AM 07/04/2020    2:12 PM 01/01/2020    9:52 AM  Depression screen PHQ 2/9  Decreased Interest 0 0 0 0 0  Down, Depressed, Hopeless 0 0 0 0 0  PHQ - 2 Score 0 0 0 0 0  Altered sleeping  0 0 0   Tired, decreased energy  1 0 0   Change in appetite  1 1 0   Feeling bad or failure about yourself   0 0 0   Trouble concentrating  0 0 0   Moving slowly or fidgety/restless  0 0 0   Suicidal thoughts  0 0 0   PHQ-9 Score  2 1 0     Allergies  Allergen Reactions   Sulfonamide Derivatives Nausea Only   Social History   Social History Narrative   Not on file   Past Medical History:  Diagnosis Date   Anxiety    Hernia of anterior abdominal wall 06/20/2014   History of kidney stones    Hyperlipidemia    Tachycardia 04/21/2010   Qualifier: Diagnosis of  By: Charlett Blake MD, Stacey     Ureteral calculus, left    Past Surgical History:  Procedure Laterality Date   CERVICAL BIOPSY  W/ LOOP ELECTRODE EXCISION  05-28-2003   dr Quincy Simmonds [place]  10-12-2005  dr Jerilynn Mages. [place] [facility]   DILATION  AND CURETTAGE OF UTERUS  06-06-2004  dr aHarrington Challenger [facility]   w/ suction for missed ab   MANDIBLE SURGERY Bilateral age 41   b/l TMJ w/screws in place   Family History  Problem Relation Age of Onset   Migraines Mother    Interstitial cystitis Mother    Melanoma Mother        back   Hypertension Father    Arthritis Father    Ulcers Maternal Grandmother        Peptic   Anemia Maternal Grandmother    Heart disease Maternal Grandfather    Alzheimer's disease Paternal Grandmother    Other Paternal Grandmother        Possible PE   Heart disease Paternal Grandfather    Allergies as of 04/02/2022       Reactions   Sulfonamide Derivatives Nausea Only        Medication List        Accurate as of April 02, 2022 10:21 AM. If you have any questions, ask your nurse or doctor.  ALPRAZolam 0.25 MG tablet Commonly known as: XANAX Take 1 tablet (0.25 mg total) by mouth at bedtime as needed for anxiety or sleep.   cyclobenzaprine 10 MG tablet Commonly known as: FLEXERIL Take 0.5-1 tablets (5-10 mg total) by mouth 3 (three) times daily as needed for muscle spasms. Started by: Howard Pouch, DO   escitalopram 20 MG tablet Commonly known as: LEXAPRO Take 1 tablet (20 mg total) by mouth daily.   HYDROcodone-acetaminophen 5-325 MG tablet Commonly known as: NORCO/VICODIN Take 1 tablet by mouth every 8 (eight) hours for 5 days. Started by: Howard Pouch, DO   metoprolol succinate 25 MG 24 hr tablet Commonly known as: TOPROL-XL TAKE 1 TABLET (25 MG TOTAL) BY MOUTH DAILY.   predniSONE 20 MG tablet Commonly known as: DELTASONE 60 mg x3d, 40 mg x3d, 20 mg x2d, 10 mg x2d What changed:  how much to take how to take this when to take this additional instructions Changed by: Howard Pouch, DO   trazodone 300 MG tablet Commonly known as: DESYREL Take 1 tablet (300 mg total) by mouth at bedtime as needed for sleep.   triamcinolone cream 0.1 % Commonly known as: KENALOG Apply  1 Application topically 2 (two) times daily.        All past medical history, surgical history, allergies, family history, immunizations andmedications were updated in the EMR today and reviewed under the history and medication portions of their EMR.     ROS Negative, with the exception of above mentioned in HPI   Objective:  BP 104/72   Pulse 80   Temp 98.1 F (36.7 C)   Wt 138 lb (62.6 kg)   SpO2 99%   BMI 24.06 kg/m  Body mass index is 24.06 kg/m.  Physical Exam Vitals and nursing note reviewed.  Constitutional:      General: She is not in acute distress.    Appearance: Normal appearance. She is normal weight. She is not ill-appearing or toxic-appearing.  Eyes:     Extraocular Movements: Extraocular movements intact.     Conjunctiva/sclera: Conjunctivae normal.     Pupils: Pupils are equal, round, and reactive to light.  Musculoskeletal:     Lumbar back: Spasms and tenderness present. No swelling, deformity or bony tenderness. Decreased range of motion.       Back:  Neurological:     Mental Status: She is alert and oriented to person, place, and time. Mental status is at baseline.  Psychiatric:        Mood and Affect: Mood normal.        Behavior: Behavior normal.        Thought Content: Thought content normal.        Judgment: Judgment normal.      No results found. No results found. No results found for this or any previous visit (from the past 24 hour(s)).  Assessment/Plan: Vickie Bennett is a 46 y.o. female present for OV for  Lumbar strain, initial encounter Rest.  Heat application. IM Depo-Medrol injection provided today. Flexeril 5-10 mg 3 times daily as needed Prednisone taper x10 days Vicodin 5-325 mg every 8-12 hours as needed Work excuse provided for today and tomorrow. Follow-up in 1 week if symptoms are not improving.  Reviewed expectations re: course of current medical issues. Discussed self-management of symptoms. Outlined signs and  symptoms indicating need for more acute intervention. Patient verbalized understanding and all questions were answered. Patient received an After-Visit Summary.    No orders of the  defined types were placed in this encounter.  Meds ordered this encounter  Medications   cyclobenzaprine (FLEXERIL) 10 MG tablet    Sig: Take 0.5-1 tablets (5-10 mg total) by mouth 3 (three) times daily as needed for muscle spasms.    Dispense:  90 tablet    Refill:  0   predniSONE (DELTASONE) 20 MG tablet    Sig: 60 mg x3d, 40 mg x3d, 20 mg x2d, 10 mg x2d    Dispense:  18 tablet    Refill:  0   DISCONTD: HYDROcodone-acetaminophen (NORCO/VICODIN) 5-325 MG tablet    Sig: Take 1 tablet by mouth every 8 (eight) hours for 5 days.    Dispense:  15 tablet    Refill:  0   HYDROcodone-acetaminophen (NORCO/VICODIN) 5-325 MG tablet    Sig: Take 1 tablet by mouth every 8 (eight) hours for 5 days.    Dispense:  15 tablet    Refill:  0   methylPREDNISolone acetate (DEPO-MEDROL) injection 80 mg   Referral Orders  No referral(s) requested today     Note is dictated utilizing voice recognition software. Although note has been proof read prior to signing, occasional typographical errors still can be missed. If any questions arise, please do not hesitate to call for verification.   electronically signed by:  Howard Pouch, DO  [place]

## 2022-07-24 ENCOUNTER — Encounter: Payer: Self-pay | Admitting: Family Medicine

## 2022-08-15 ENCOUNTER — Encounter: Payer: Self-pay | Admitting: Family Medicine

## 2022-08-27 ENCOUNTER — Other Ambulatory Visit: Payer: Self-pay | Admitting: Family Medicine

## 2022-09-03 ENCOUNTER — Other Ambulatory Visit: Payer: Self-pay

## 2022-09-03 DIAGNOSIS — F99 Mental disorder, not otherwise specified: Secondary | ICD-10-CM

## 2022-09-03 NOTE — Telephone Encounter (Signed)
Pharmacy faxed request to refill Trazodone. Pt is currently due for [facility] follow up. Refill denied

## 2022-09-24 ENCOUNTER — Other Ambulatory Visit: Payer: Self-pay

## 2022-09-24 MED ORDER — METOPROLOL SUCCINATE ER 25 MG PO TB24: ORAL_TABLET | ORAL | 0 refills | Status: DC

## 2022-10-10 ENCOUNTER — Telehealth: Payer: Self-pay | Admitting: Family Medicine

## 2022-10-10 DIAGNOSIS — F411 Generalized anxiety disorder: Secondary | ICD-10-CM

## 2022-10-10 MED ORDER — METOPROLOL SUCCINATE ER 25 MG PO TB24: ORAL_TABLET | ORAL | 0 refills | Status: DC

## 2022-10-10 MED ORDER — ESCITALOPRAM OXALATE 20 MG PO TABS: 20.0000 mg | ORAL_TABLET | Freq: Every day | ORAL | 0 refills | Status: DC

## 2022-10-10 NOTE — Telephone Encounter (Signed)
30 d/s sent 

## 2022-10-10 NOTE — Telephone Encounter (Signed)
Patient is requesting refills on metoprolol succinate (TOPROL-XL) 25 MG 24 hr tablet  and escitalopram (LEXAPRO) 20 MG tablet . She is scheduled for an office visit on 06/24.  Pharmacy is confirmed as [facility] in [place]

## 2022-10-11 ENCOUNTER — Other Ambulatory Visit: Payer: Self-pay

## 2022-10-16 ENCOUNTER — Other Ambulatory Visit: Payer: Self-pay

## 2022-10-16 DIAGNOSIS — F5105 Insomnia due to other mental disorder: Secondary | ICD-10-CM

## 2022-10-16 MED ORDER — TRAZODONE HCL 300 MG PO TABS: 300.0000 mg | ORAL_TABLET | Freq: Every evening | ORAL | 0 refills | Status: DC | PRN

## 2022-11-12 ENCOUNTER — Ambulatory Visit: Payer: BC Managed Care – PPO | Admitting: Family Medicine

## 2022-11-26 ENCOUNTER — Other Ambulatory Visit: Payer: Self-pay | Admitting: Family Medicine

## 2022-11-26 DIAGNOSIS — Z1231 Encounter for screening mammogram for malignant neoplasm of breast: Secondary | ICD-10-CM

## 2022-12-12 ENCOUNTER — Other Ambulatory Visit: Payer: Self-pay

## 2022-12-12 DIAGNOSIS — F5105 Insomnia due to other mental disorder: Secondary | ICD-10-CM

## 2022-12-12 DIAGNOSIS — F411 Generalized anxiety disorder: Secondary | ICD-10-CM

## 2023-01-03 ENCOUNTER — Encounter (INDEPENDENT_AMBULATORY_CARE_PROVIDER_SITE_OTHER): Payer: Self-pay

## 2023-01-07 ENCOUNTER — Ambulatory Visit: Payer: BC Managed Care – PPO

## 2023-01-08 ENCOUNTER — Ambulatory Visit
Admission: RE | Admit: 2023-01-08 | Discharge: 2023-01-08 | Disposition: A | Discharge status: Home / Self Care | Payer: BC Managed Care – PPO | Source: Ambulatory Visit | Attending: Family Medicine | Admitting: Family Medicine

## 2023-01-08 DIAGNOSIS — Z1231 Encounter for screening mammogram for malignant neoplasm of breast: Secondary | ICD-10-CM

## 2023-01-28 ENCOUNTER — Encounter: Payer: Self-pay | Admitting: Family Medicine

## 2023-01-28 ENCOUNTER — Telehealth (INDEPENDENT_AMBULATORY_CARE_PROVIDER_SITE_OTHER): Payer: BC Managed Care – PPO | Admitting: Family

## 2023-01-28 DIAGNOSIS — U071 COVID-19: Secondary | ICD-10-CM | POA: Insufficient documentation

## 2023-01-28 HISTORY — DX: COVID-19: U07.1

## 2023-01-28 NOTE — Progress Notes (Signed)
MyChart Video Visit    Virtual Visit via Video Note    Patient location: Home. Patient and provider in visit Provider location: Office  I discussed the limitations of evaluation and management by telemedicine and the availability of in person appointments. The patient expressed understanding and agreed to proceed.  Visit Date: 01/28/2023  Today's healthcare provider: Lemont Fillers, NP     Subjective:    Patient ID: Vickie Bennett, female    DOB: 09/23/1975, 47 y.o.   MRN: [id number]  Chief Complaint  Patient presents with   Covid Positive    Tested positive yesterday   Covid symptoms    Symptoms started 9/6 . 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 She reports onset of symptoms three days ago, initially experiencing chills and not feeling well. The following day, she woke up feeling worse and tested negative for COVID-19. However, the subsequent day, she tested positive. Her symptoms include congestion, headaches, and cough. She has tried various remedies for the congestion, including hot compresses, steam inhalation, and an electric Neti Pot, but relief has been temporary. She has also tried over-the-counter decongestants, but these have not been effective. She has been vaccinated against COVID-19.  Past Medical History:  Diagnosis Date   Anxiety    Hernia of anterior abdominal wall 06/20/2014   History of kidney stones    Hyperlipidemia    Tachycardia 04/21/2010   Qualifier: Diagnosis of  By: Abner Greenspan MD, Stacey     Ureteral calculus, left     Past Surgical History:  Procedure Laterality Date   CERVICAL BIOPSY  W/ LOOP ELECTRODE EXCISION  05-28-2003   dr Edward Jolly [facility]   CESAREAN SECTION  10-12-2005  dr Judie Petit. [facility]   DILATION AND CURETTAGE OF UTERUS  06-06-2004  dr [facility]Tenny Craw [facility]   w/ suction for missed ab   MANDIBLE  SURGERY Bilateral age 78   b/l TMJ w/screws in place    Family History  Problem Relation Age of Onset   Migraines Mother    Interstitial cystitis Mother    Melanoma Mother        back   Hypertension Father    Arthritis Father    Ulcers Maternal Grandmother        Peptic   Anemia Maternal Grandmother    Heart disease Maternal Grandfather    Alzheimer's disease Paternal Grandmother    Other Paternal Grandmother        Possible PE   Heart disease Paternal Grandfather     Social History   Socioeconomic History   Marital status: Married    Spouse name: Not on file   Number of children: Not on file   Years of education: Not on file   Highest education level: Bachelor's degree (e.g., BA, AB, BS)  Occupational History   Not on file  Tobacco Use   Smoking status: Former    Current packs/day: 0.00    Types: Cigarettes    Quit date: 02/05/2009    Years since quitting: 13.9   Smokeless tobacco: Never  Vaping Use   Vaping status: Never Used  Substance and Sexual Activity   Alcohol use: Yes    Alcohol/week: 0.0 standard drinks of alcohol    Comment: occasionaly   Drug use: No   Sexual activity: Yes    Partners: Male  Birth control/protection: Pill  Other Topics Concern   Not on file  Social History Narrative   Not on file   Social Determinants of Health   Financial Resource Strain: Low Risk  (07/10/2021)   Overall Financial Resource Strain (CARDIA)    Difficulty of Paying Living Expenses: Not hard at all  Food Insecurity: No Food Insecurity (07/10/2021)   Hunger Vital Sign    Worried About Running Out of Food in the Last Year: Never true    Ran Out of Food in the Last Year: Never true  Transportation Needs: No Transportation Needs (07/10/2021)   PRAPARE - Administrator, Civil Service (Medical): No    Lack of Transportation (Non-Medical): No  Physical Activity: Unknown (07/10/2021)   Exercise Vital Sign    Days of Exercise per Week: 0 days    Minutes of  Exercise per Session: Not on file  Stress: No Stress Concern Present (07/10/2021)   [employer] of Occupational Health - Occupational Stress Questionnaire    Feeling of Stress : Not at all  Social Connections: Unknown (07/10/2021)   Social Connection and Isolation Panel [NHANES]    Frequency of Communication with Friends and Family: Not on file    Frequency of Social Gatherings with Friends and Family: Once a week    Attends Religious Services: More than 4 times per year    Active Member of [employer] or Organizations: Yes    Attends Banker Meetings: 1 to 4 times per year    Marital Status: Married  Catering manager Violence: Not on file    Outpatient Medications Prior to Visit  Medication Sig Dispense Refill   ALPRAZolam (XANAX) 0.25 MG tablet Take 1 tablet (0.25 mg total) by mouth at bedtime as needed for anxiety or sleep. 90 tablet 1   cyclobenzaprine (FLEXERIL) 10 MG tablet Take 0.5-1 tablets (5-10 mg total) by mouth 3 (three) times daily as needed for muscle spasms. 90 tablet 0   escitalopram (LEXAPRO) 20 MG tablet Take 1 tablet (20 mg total) by mouth daily. MUST KEEP SCHEDULED APPT 30 tablet 0   metoprolol succinate (TOPROL-XL) 25 MG 24 hr tablet TAKE 1 TABLET (25 MG TOTAL) BY MOUTH DAILY. MUST KEEP SCHEDULED APPT 30 tablet 0   trazodone (DESYREL) 300 MG tablet Take 1 tablet (300 mg total) by mouth at bedtime as needed for sleep. 30 tablet 0   triamcinolone cream (KENALOG) 0.1 % Apply 1 Application topically 2 (two) times daily. 30 g 1   No facility-administered medications prior to visit.    Allergies  Allergen Reactions   Sulfonamide Derivatives Nausea Only    ROS    See HPI Objective:    Physical Exam  There were no vitals taken for this visit. Wt Readings from Last 3 Encounters:  04/02/22 138 lb (62.6 kg)  02/05/22 136 lb (61.7 kg)  01/16/22 131 lb (59.4 kg)    Gen: Awake, alert, no acute distress Resp: Breathing is even and non-labored Psych:  calm/pleasant demeanor Neuro: Alert and Oriented x 3, + facial symmetry, speech is clear.     Assessment & Plan:   Problem List Items Addressed This Visit       Unprioritized   COVID-19 virus infection - Primary     Symptomatic with congestion, headache, and cough. Onset of symptoms three days ago. 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 No severe symptoms or red flags. -Strict quarantine until 01/31/2023, if symptoms improve may come off quarantin on  9/13, but should continue to wear a mask in public for an additional five days unless she tests negative during that time. -Over-the-counter treatments:Tylenol or Motrin for aches and pains, nasal saline spray, Mucinex, coricidin. Avoid Sudafed due to history of tachycardia. -Notify healthcare provider if severe symptoms develop (inability to eat or drink, severe weakness, chest pain, shortness of breath).  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 -Remain out of work through 01/30/2023. May return to work on 01/31/2023 if symptoms improve. If not improved, contact healthcare provider for extension of work excuse.       I am having Chessa Lightburn. Studer maintain her ALPRAZolam, triamcinolone cream, cyclobenzaprine, metoprolol succinate, escitalopram, and trazodone.  No orders of the defined types were placed in this encounter.   I discussed the assessment and treatment plan with the patient. The patient was provided an opportunity to ask questions and all were answered. The patient agreed with the plan and demonstrated an understanding of the instructions.   The patient was advised to call back or seek an in-person evaluation if the symptoms worsen or if the condition fails to improve as anticipated.   Lemont Fillers, NP [facility] [phone] (phone) [phone] (fax)  [facility]

## 2023-01-28 NOTE — Assessment & Plan Note (Addendum)
  Symptomatic with congestion, headache, and cough. Onset of symptoms three days ago. 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 No severe symptoms or red flags. -Strict quarantine until 01/31/2023, if symptoms improve may come off quarantin on 9/13, but should continue to wear a mask in public for an additional five days unless she tests negative during that time. -Over-the-counter treatments:Tylenol or Motrin for aches and pains, nasal saline spray, Mucinex, coricidin. Avoid Sudafed due to history of tachycardia. -Go to ER if severe symptoms develop (inability to eat or drink, severe weakness, chest pain, shortness of breath).  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 -Remain out of work through 01/30/2023. May return to work on 01/31/2023 if symptoms improve. If not improved, contact healthcare provider for extension of work excuse.

## 2023-01-28 NOTE — Patient Instructions (Signed)
VISIT SUMMARY:  During your visit, we discussed your recent positive COVID-19 test and the symptoms you've been experiencing, including congestion, headaches, and cough. We also reviewed your history of tachycardia, which is being managed with Metoprolol. 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  YOUR PLAN:  -COVID-19: COVID-19 is a viral infection that can cause symptoms like congestion, headaches, and cough. You should strictly quarantine until January 31, 2023, if your symptoms improve. Continue to wear a mask in public for an additional five days unless you test negative during that time You can use over-the-counter treatments like Tylenol or Motrin for aches and pains, nasal saline spray, Mucinex, and quercetin. Avoid Sudafed due to your history of tachycardia. If you develop severe symptoms like inability to eat or drink, severe weakness, chest pain, or shortness of breath, notify your healthcare provider immediately.  -TACHYCARDIA: Tachycardia is a condition where your heart rate is higher than normal. You should continue taking Metoprolol as prescribed to manage this condition.  -WORK EXCUSE: Due to your COVID-19 diagnosis, you should remain out of work through January 30, 2023. You may return to work on January 31, 2023, if your symptoms improve. If not, contact your healthcare provider for an extension of your work excuse.

## 2023-01-29 NOTE — Telephone Encounter (Signed)
She may want to reschedule her CPE, mostly for lab collection might be is skewed by her acute illness. As far as having COVID and being seen in the doctor's office, if she is 10 days since her symptoms onset and she is fever free, she could be seen for her physical.

## 2023-01-31 ENCOUNTER — Encounter: Payer: BC Managed Care – PPO | Admitting: Family Medicine

## 2023-02-06 ENCOUNTER — Ambulatory Visit (INDEPENDENT_AMBULATORY_CARE_PROVIDER_SITE_OTHER): Payer: BC Managed Care – PPO | Admitting: Family Medicine

## 2023-02-06 ENCOUNTER — Other Ambulatory Visit (HOSPITAL_COMMUNITY)
Admission: RE | Admit: 2023-02-06 | Discharge: 2023-02-06 | Disposition: A | Discharge status: Home / Self Care | Payer: BC Managed Care – PPO | Source: Ambulatory Visit | Attending: Family Medicine | Admitting: Family Medicine

## 2023-02-06 ENCOUNTER — Encounter: Payer: Self-pay | Admitting: Family Medicine

## 2023-02-06 VITALS — BP 124/85 | HR 105 | Temp 98.1°F | Ht 62.6 in | Wt 142.0 lb

## 2023-02-06 DIAGNOSIS — F411 Generalized anxiety disorder: Secondary | ICD-10-CM | POA: Diagnosis not present

## 2023-02-06 DIAGNOSIS — Z1231 Encounter for screening mammogram for malignant neoplasm of breast: Secondary | ICD-10-CM

## 2023-02-06 DIAGNOSIS — Z01419 Encounter for gynecological examination (general) (routine) without abnormal findings: Secondary | ICD-10-CM | POA: Insufficient documentation

## 2023-02-06 DIAGNOSIS — Z131 Encounter for screening for diabetes mellitus: Secondary | ICD-10-CM

## 2023-02-06 DIAGNOSIS — Z13220 Encounter for screening for lipoid disorders: Secondary | ICD-10-CM | POA: Diagnosis not present

## 2023-02-06 DIAGNOSIS — F99 Mental disorder, not otherwise specified: Secondary | ICD-10-CM | POA: Diagnosis not present

## 2023-02-06 DIAGNOSIS — Z23 Encounter for immunization: Secondary | ICD-10-CM | POA: Diagnosis not present

## 2023-02-06 DIAGNOSIS — F5105 Insomnia due to other mental disorder: Secondary | ICD-10-CM

## 2023-02-06 DIAGNOSIS — Z124 Encounter for screening for malignant neoplasm of cervix: Secondary | ICD-10-CM

## 2023-02-06 DIAGNOSIS — R000 Tachycardia, unspecified: Secondary | ICD-10-CM | POA: Diagnosis not present

## 2023-02-06 DIAGNOSIS — Z0000 Encounter for general adult medical examination without abnormal findings: Secondary | ICD-10-CM | POA: Diagnosis not present

## 2023-02-06 DIAGNOSIS — Z1211 Encounter for screening for malignant neoplasm of colon: Secondary | ICD-10-CM

## 2023-02-06 MED ORDER — METOPROLOL SUCCINATE ER 25 MG PO TB24: 25.0000 mg | ORAL_TABLET | Freq: Every day | ORAL | 1 refills | Status: DC

## 2023-02-06 MED ORDER — ALPRAZOLAM 0.25 MG PO TABS
0.2500 mg | ORAL_TABLET | Freq: Every evening | ORAL | 1 refills | Status: DC | PRN
Start: 2023-02-06 — End: 2023-07-18

## 2023-02-06 MED ORDER — TRAZODONE HCL 300 MG PO TABS: 300.0000 mg | ORAL_TABLET | Freq: Every evening | ORAL | 1 refills | Status: DC | PRN

## 2023-02-06 MED ORDER — ESCITALOPRAM OXALATE 20 MG PO TABS: 20.0000 mg | ORAL_TABLET | Freq: Every day | ORAL | 1 refills | Status: DC

## 2023-02-06 NOTE — Progress Notes (Signed)
Patient ID: MYYA BLANKENSHIP, female  DOB: Jun 16, 1975, 47 y.o.   MRN: [id number] Patient Care Team    Relationship Specialty Notifications Start End  Natalia Leatherwood, DO PCP - General Family Medicine  12/24/14   Evaristo Bury, MD Referring Physician Obstetrics and Gynecology  12/26/15     Chief Complaint  Patient presents with   Annual Exam    W/pap and cmc    Subjective: Vickie Bennett is a 47 y.o.  Female  present for CPE and Chronic Conditions/illness Management All past medical history, surgical history, allergies, family history, immunizations, medications and social history were updated in the electronic medical record today. All recent labs, ED visits and hospitalizations within the last year were reviewed.  Health maintenance:  Colonoscopy: No fhx, screen at 47> cologuard ordred Mammogram: completed 01/08/2023 [facility]> order placed or 2025 Cervical cancer screening: last pap:10/24/2017, results: Normal, neg co test > completed today Immunizations: tdap UTD 2022, Influenza completed today (encouraged yearly),COVID completed  Infectious disease screening: HIV completed , Hep C completed DEXA: per routine screen Assistive device: None Oxygen use: None Patient has a Dental home. Hospitalizations/ED visits: Reviewed  tachycardia She reports her tachycardia and palpitations are controlled with metoprolol 25 mg daily.   Anxiety/insomnia She reports compliance with trazodone 300 mg nightly, Lexapro 20 mg daily and Xanax 0.25 mg daily as needed.  She feels regimen is working well for her     02/06/2023    2:02 PM 04/02/2022   11:54 AM 01/16/2022    8:13 AM 07/07/2021    3:19 PM 01/13/2021   10:00 AM  Depression screen PHQ 2/9  Decreased Interest 0 0 0 0 0  Down, Depressed, Hopeless 0 0 0 0 0  PHQ - 2 Score 0 0 0 0 0  Altered sleeping    0 0  Tired, decreased energy    1 0  Change in appetite    1 1  Feeling bad or failure about yourself     0 0  Trouble concentrating     0 0  Moving slowly or fidgety/restless    0 0  Suicidal thoughts    0 0  PHQ-9 Score    2 1      02/06/2023    2:02 PM 07/07/2021    3:19 PM 01/13/2021   10:00 AM 07/04/2020    2:13 PM  GAD 7 : Generalized Anxiety Score  Nervous, Anxious, on Edge 1 0 1 1  Control/stop worrying 1 0 0 1  Worry too much - different things 1 0 0 1  Trouble relaxing 0 0 0 1  Restless 0 0 0 0  Easily annoyed or irritable 1 1 0 1  Afraid - awful might happen 1 0 1 0  Total GAD 7 Score 5 1 2 5   Anxiety Difficulty Not difficult at all       Immunization History  Administered Date(s) Administered   HPV 9-valent 08/22/2017, 01/16/2022, 02/06/2023   Influenza Split 03/07/2011, 03/12/2012   Influenza, Seasonal, Injecte, Preservative Fre 02/06/2023   Influenza,inj,Quad PF,6+ Mos 05/04/2013, 03/02/2015, 05/01/2016, 05/01/2017, 04/02/2018, 01/13/2021, 01/16/2022   Influenza,inj,quad, With Preservative 03/01/2014   Influenza-Unspecified 04/20/2014   PFIZER(Purple Top)SARS-COV-2 Vaccination 01/02/2020, 01/27/2020   PPD Test 12/26/2015   Tdap 03/07/2011, 01/13/2021    Past Medical History:  Diagnosis Date   Anxiety    COVID-19 virus infection 01/28/2023   Hernia of anterior abdominal wall 06/20/2014   History of kidney stones  Hyperlipidemia    Tachycardia 04/21/2010   Qualifier: Diagnosis of  By: Abner Greenspan MD, Stacey     Ureteral calculus, left    Allergies  Allergen Reactions   Sulfonamide Derivatives Nausea Only   Past Surgical History:  Procedure Laterality Date   CERVICAL BIOPSY  W/ LOOP ELECTRODE EXCISION  05-28-2003   dr Edward Jolly [facility]   CESAREAN SECTION  10-12-2005  dr Judie Petit. [facility]   DILATION AND CURETTAGE OF UTERUS  06-06-2004  dr aTenny Craw [facility]   w/ suction for missed ab   MANDIBLE SURGERY Bilateral age 89   b/l TMJ w/screws in place   Family History  Problem Relation Age of Onset   Migraines Mother    Interstitial cystitis Mother    Melanoma Mother        back   Hypertension Father     Arthritis Father    Ulcers Maternal Grandmother        Peptic   Anemia Maternal Grandmother    Heart disease Maternal Grandfather    Alzheimer's disease Paternal Grandmother    Other Paternal Grandmother        Possible PE   Heart disease Paternal Grandfather    Social History   Social History Narrative   Not on file    Allergies as of 02/06/2023       Reactions   Sulfonamide Derivatives Nausea Only        Medication List        Accurate as of February 06, 2023  2:49 PM. If you have any questions, ask your nurse or doctor.          STOP taking these medications    cyclobenzaprine 10 MG tablet Commonly known as: FLEXERIL Stopped by: Felix Pacini       TAKE these medications    ALPRAZolam 0.25 MG tablet Commonly known as: XANAX Take 1 tablet (0.25 mg total) by mouth at bedtime as needed for anxiety or sleep.   escitalopram 20 MG tablet Commonly known as: LEXAPRO Take 1 tablet (20 mg total) by mouth daily. What changed: additional instructions Changed by: Felix Pacini   metoprolol succinate 25 MG 24 hr tablet Commonly known as: TOPROL-XL Take 1 tablet (25 mg total) by mouth daily. What changed:  how much to take how to take this when to take this additional instructions Changed by: Felix Pacini   trazodone 300 MG tablet Commonly known as: DESYREL Take 1 tablet (300 mg total) by mouth at bedtime as needed for sleep.   triamcinolone cream 0.1 % Commonly known as: KENALOG Apply 1 Application topically 2 (two) times daily.        All past medical history, surgical history, allergies, family history, immunizations andmedications were updated in the EMR today and reviewed under the history and medication portions of their EMR.     No results found for this or any previous visit (from the past 2160 hour(s)).  ROS 14 pt review of systems performed and negative (unless mentioned in an HPI)  Objective: BP 124/85   Pulse (!) 105   Temp 98.1 F  (36.7 C)   Ht 5' 2.6" (1.59 m)   Wt 142 lb (64.4 kg)   LMP 01/16/2023   SpO2 99%   BMI 25.48 kg/m  Physical Exam Vitals and nursing note reviewed.  Constitutional:      General: She is not in acute distress.    Appearance: Normal appearance. She is not ill-appearing or toxic-appearing.  HENT:  Head: Normocephalic and atraumatic.     Right Ear: Tympanic membrane, ear canal and external ear normal. There is no impacted cerumen.     Left Ear: Tympanic membrane, ear canal and external ear normal. There is no impacted cerumen.     Nose: No congestion or rhinorrhea.     Mouth/Throat:     Mouth: Mucous membranes are moist.     Pharynx: Oropharynx is clear. No oropharyngeal exudate or posterior oropharyngeal erythema.  Eyes:     General: No scleral icterus.       Right eye: No discharge.        Left eye: No discharge.     Extraocular Movements: Extraocular movements intact.     Conjunctiva/sclera: Conjunctivae normal.     Pupils: Pupils are equal, round, and reactive to light.  Cardiovascular:     Rate and Rhythm: Normal rate and regular rhythm.     Pulses: Normal pulses.     Heart sounds: Normal heart sounds. No murmur heard.    No friction rub. No gallop.  Pulmonary:     Effort: Pulmonary effort is normal. No respiratory distress.     Breath sounds: Normal breath sounds. No stridor. No wheezing, rhonchi or rales.  Chest:     Chest wall: No tenderness.  Abdominal:     General: Abdomen is flat. Bowel sounds are normal. There is no distension.     Palpations: Abdomen is soft. There is no mass.     Tenderness: There is no abdominal tenderness. There is no right CVA tenderness, left CVA tenderness, guarding or rebound.     Hernia: No hernia is present.  Musculoskeletal:        General: No swelling, tenderness or deformity. Normal range of motion.     Cervical back: Normal range of motion and neck supple. No rigidity or tenderness.     Right lower leg: No edema.     Left lower  leg: No edema.  Lymphadenopathy:     Cervical: No cervical adenopathy.  Skin:    General: Skin is warm and dry.     Coloration: Skin is not jaundiced or pale.     Findings: No bruising, erythema, lesion or rash.  Neurological:     General: No focal deficit present.     Mental Status: She is alert and oriented to person, place, and time. Mental status is at baseline.     Cranial Nerves: No cranial nerve deficit.     Sensory: No sensory deficit.     Motor: No weakness.     Coordination: Coordination normal.     Gait: Gait normal.     Deep Tendon Reflexes: Reflexes normal.  Psychiatric:        Mood and Affect: Mood normal.        Behavior: Behavior normal.        Thought Content: Thought content normal.        Judgment: Judgment normal.        No results found.  Assessment/plan: Vickie Bennett is a 47 y.o. female present for CPE and Chronic Conditions/illness Management Generalized anxiety disorder/insomnia/Benzodiazepine contract exists Stable Continue Lexapro 20 mg daily Continue Xanax daily as needed Continue  trazodone 300 mg nightly [place] controlled substance database reviewed  02/06/23   Tachycardia Stable Continue metoprolol 25 mg daily  Cbc, cmp thsh, lipids  Need for HPV vaccination -HPV #3  completed today  Routine general medical examination at a health care facility Patient was encouraged  to exercise greater than 150 minutes a week. Patient was encouraged to choose a diet filled with fresh fruits and vegetables, and lean meats. AVS provided to patient today for education/recommendation on gender specific health and safety maintenance. Colonoscopy: No fhx, screen at 47> cologuard ordred Mammogram: completed 01/08/2023 [facility]> order placed or 2025 Cervical cancer screening: last pap:10/24/2017, results: Normal, neg co test > completed today Immunizations: tdap UTD 2022, Influenza completed today (encouraged yearly),COVID completed  Infectious disease  screening: HIV completed , Hep C completed DEXA: per routine screen  Return in about 24 weeks (around 07/24/2023) for Routine chronic condition follow-up.  Orders Placed This Encounter  Procedures   MM 3D SCREENING MAMMOGRAM BILATERAL BREAST   Flu vaccine trivalent PF, 6mos and older(Flulaval,Afluria,Fluarix,Fluzone)   HPV 9-valent vaccine,Recombinat   Comprehensive metabolic panel   Hemoglobin A1c   TSH   Lipid panel   CBC   Cologuard     Meds ordered this encounter  Medications   escitalopram (LEXAPRO) 20 MG tablet    Sig: Take 1 tablet (20 mg total) by mouth daily.    Dispense:  90 tablet    Refill:  1   metoprolol succinate (TOPROL-XL) 25 MG 24 hr tablet    Sig: Take 1 tablet (25 mg total) by mouth daily.    Dispense:  90 tablet    Refill:  1   trazodone (DESYREL) 300 MG tablet    Sig: Take 1 tablet (300 mg total) by mouth at bedtime as needed for sleep.    Dispense:  90 tablet    Refill:  1   ALPRAZolam (XANAX) 0.25 MG tablet    Sig: Take 1 tablet (0.25 mg total) by mouth at bedtime as needed for anxiety or sleep.    Dispense:  90 tablet    Refill:  1   Referral Orders  No referral(s) requested today     Electronically signed by: Felix Pacini, DO [facility] [place]

## 2023-02-06 NOTE — Patient Instructions (Addendum)

## 2023-02-07 LAB — COMPREHENSIVE METABOLIC PANEL
ALT: 19 U/L (ref 0–35)
AST: 16 U/L (ref 0–37)
Albumin: 4.3 g/dL (ref 3.5–5.2)
Alkaline Phosphatase: 60 U/L (ref 39–117)
BUN: 6 mg/dL (ref 6–23)
CO2: 24 mEq/L (ref 19–32)
Calcium: 9.1 mg/dL (ref 8.4–10.5)
Chloride: 105 mEq/L (ref 96–112)
Creatinine, Ser: 0.71 mg/dL (ref 0.40–1.20)
GFR: 102.85 mL/min (ref >60.00)
Glucose, Bld: 91 mg/dL (ref 70–99)
Potassium: 3.8 mEq/L (ref 3.5–5.1)
Sodium: 140 mEq/L (ref 135–145)
Total Bilirubin: 0.6 mg/dL (ref 0.2–1.2)
Total Protein: 6.4 g/dL (ref 6.0–8.3)

## 2023-02-07 LAB — LIPID PANEL
Cholesterol: 136 mg/dL (ref 0–200)
HDL: 69.8 mg/dL (ref >39.00)
LDL Cholesterol: 55 mg/dL (ref 0–99)
NonHDL: 66.22
Total CHOL/HDL Ratio: 2
Triglycerides: 55 mg/dL (ref 0.0–149.0)
VLDL: 11 mg/dL (ref 0.0–40.0)

## 2023-02-07 LAB — CBC
HCT: 40.6 % (ref 36.0–46.0)
Hemoglobin: 13.1 g/dL (ref 12.0–15.0)
MCHC: 32.3 g/dL (ref 30.0–36.0)
MCV: 97.1 fl (ref 78.0–100.0)
Platelets: 237 10*3/uL (ref 150.0–400.0)
RBC: 4.18 Mil/uL (ref 3.87–5.11)
RDW: 12.9 % (ref 11.5–15.5)
WBC: 6.3 10*3/uL (ref 4.0–10.5)

## 2023-02-07 LAB — HEMOGLOBIN A1C: Hgb A1c MFr Bld: 5.4 % (ref 4.6–6.5)

## 2023-02-07 LAB — TSH: TSH: 2.58 u[IU]/mL (ref 0.35–5.50)

## 2023-02-12 LAB — CYTOLOGY - PAP
Adequacy: ABSENT
Comment: NEGATIVE
Diagnosis: NEGATIVE
High risk HPV: NEGATIVE

## 2023-06-16 ENCOUNTER — Encounter: Payer: Self-pay | Admitting: Family Medicine

## 2023-07-03 ENCOUNTER — Encounter: Payer: Self-pay | Admitting: Family Medicine

## 2023-07-03 DIAGNOSIS — Z1211 Encounter for screening for malignant neoplasm of colon: Secondary | ICD-10-CM

## 2023-07-22 ENCOUNTER — Ambulatory Visit (INDEPENDENT_AMBULATORY_CARE_PROVIDER_SITE_OTHER): Payer: 59 | Admitting: Family Medicine

## 2023-07-22 ENCOUNTER — Encounter: Payer: Self-pay | Admitting: Family Medicine

## 2023-07-22 VITALS — BP 126/89 | HR 109 | Ht 62.6 in | Wt 143.4 lb

## 2023-07-22 DIAGNOSIS — R000 Tachycardia, unspecified: Secondary | ICD-10-CM

## 2023-07-22 DIAGNOSIS — F5101 Primary insomnia: Secondary | ICD-10-CM | POA: Diagnosis not present

## 2023-07-22 DIAGNOSIS — F411 Generalized anxiety disorder: Secondary | ICD-10-CM

## 2023-07-22 MED ORDER — ALPRAZOLAM 0.25 MG PO TABS: 0.2500 mg | ORAL_TABLET | Freq: Every evening | ORAL | 1 refills | Status: DC | PRN

## 2023-07-22 MED ORDER — TRAZODONE HCL 300 MG PO TABS: 300.0000 mg | ORAL_TABLET | Freq: Every evening | ORAL | 1 refills | Status: DC | PRN

## 2023-07-22 MED ORDER — METOPROLOL SUCCINATE ER 25 MG PO TB24: 25.0000 mg | ORAL_TABLET | Freq: Every day | ORAL | 1 refills | Status: DC

## 2023-07-22 MED ORDER — ESCITALOPRAM OXALATE 20 MG PO TABS: 20.0000 mg | ORAL_TABLET | Freq: Every day | ORAL | 1 refills | Status: DC

## 2023-07-22 NOTE — Patient Instructions (Addendum)
 Return in about 7 months (around 02/07/2024) for cpe (20 min), Routine chronic condition follow-up.        Great to see you today.  I have refilled the medication(s) we provide.   If labs were collected or images ordered, we will inform you of  results once we have received them and reviewed. We will contact you either by echart message, or telephone call.  Please give ample time to the testing facility, and our office to run,  receive and review results. Please do not call inquiring of results, even if you can see them in your chart. We will contact you as soon as we are able. If it has been over 1 week since the test was completed, and you have not yet heard from [place], then please call us.    - echart message- for normal results that have been seen by the patient already.   - telephone call: abnormal results or if patient has not viewed results in their echart.  If a referral to a specialist was entered for you, please call us in 2 weeks if you have not heard from the specialist office to schedule.

## 2023-07-22 NOTE — Progress Notes (Signed)
 Patient ID: Vickie Bennett, female  DOB: 09-May-1976, 48 y.o.   MRN: [id number] Patient Care Team    Relationship Specialty Notifications Start End  Natalia Leatherwood, DO PCP - General Family Medicine  12/24/14   Evaristo Bury, MD Referring Physician Obstetrics and Gynecology  12/26/15   Hilarie Fredrickson, MD Consulting Physician Gastroenterology  07/22/23     Chief Complaint  Patient presents with   Medical Management of Chronic Issues    Subjective: Vickie Bennett is a 48 y.o.  Female  present for Chronic Conditions/illness Management All past medical history, surgical history, allergies, family history, immunizations, medications and social history were updated in the electronic medical record today. All recent labs, ED visits and hospitalizations within the last year were reviewed.  tachycardia She reports her tachycardia and palpitations are controlled with metoprolol 25 mg daily.    Anxiety/insomnia She reports compliance with trazodone 300 mg nightly, Lexapro 20 mg daily and Xanax 0.25 mg daily as needed.  She feels regimen is working well for her     07/22/2023   10:46 AM 02/06/2023    2:02 PM 04/02/2022   11:54 AM 01/16/2022    8:13 AM 07/07/2021    3:19 PM  Depression screen PHQ 2/9  Decreased Interest 0 0 0 0 0  Down, Depressed, Hopeless 0 0 0 0 0  PHQ - 2 Score 0 0 0 0 0  Altered sleeping     0  Tired, decreased energy     1  Change in appetite     1  Feeling bad or failure about yourself      0  Trouble concentrating     0  Moving slowly or fidgety/restless     0  Suicidal thoughts     0  PHQ-9 Score     2      07/22/2023   10:46 AM 02/06/2023    2:02 PM 07/07/2021    3:19 PM 01/13/2021   10:00 AM  GAD 7 : Generalized Anxiety Score  Nervous, Anxious, on Edge 1 1 0 1  Control/stop worrying 2 1 0 0  Worry too much - different things 1 1 0 0  Trouble relaxing 0 0 0 0  Restless 0 0 0 0  Easily annoyed or irritable 0 1 1 0  Afraid - awful might happen 1 1 0 1   Total GAD 7 Score 5 5 1 2   Anxiety Difficulty Not difficult at all Not difficult at all      Immunization History  Administered Date(s) Administered   HPV 9-valent 08/22/2017, 01/16/2022, 02/06/2023   Influenza Split 03/07/2011, 03/12/2012   Influenza, Seasonal, Injecte, Preservative Fre 02/06/2023   Influenza,inj,Quad PF,6+ Mos 05/04/2013, 03/02/2015, 05/01/2016, 05/01/2017, 04/02/2018, 01/13/2021, 01/16/2022   Influenza,inj,quad, With Preservative 03/01/2014   Influenza-Unspecified 04/20/2014   PFIZER(Purple Top)SARS-COV-2 Vaccination 01/02/2020, 01/27/2020   PPD Test 12/26/2015   Tdap 03/07/2011, 01/13/2021    Past Medical History:  Diagnosis Date   Anxiety    COVID-19 virus infection 01/28/2023   Hernia of anterior abdominal wall 06/20/2014   History of kidney stones    Hyperlipidemia    Tachycardia 04/21/2010   Qualifier: Diagnosis of  By: Abner Greenspan MD, Stacey     Ureteral calculus, left    Allergies  Allergen Reactions   Sulfonamide Derivatives Nausea Only   Past Surgical History:  Procedure Laterality Date   CERVICAL BIOPSY  W/ LOOP ELECTRODE EXCISION  05-28-2003   dr Edward Jolly [facility]  CESAREAN SECTION  10-12-2005  dr Judie Petit. [facility]   DILATION AND CURETTAGE OF UTERUS  06-06-2004  dr aTenny Craw [facility]   w/ suction for missed ab   MANDIBLE SURGERY Bilateral age 34   b/l TMJ w/screws in place   Family History  Problem Relation Age of Onset   Migraines Mother    Interstitial cystitis Mother    Melanoma Mother        back   Hypertension Father    Arthritis Father    Ulcers Maternal Grandmother        Peptic   Anemia Maternal Grandmother    Heart disease Maternal Grandfather    Alzheimer's disease Paternal Grandmother    Other Paternal Grandmother        Possible PE   Heart disease Paternal Grandfather    Social History   Social History Narrative   Not on file    Allergies as of 07/22/2023       Reactions   Sulfonamide Derivatives Nausea Only        Medication  List        Accurate as of July 22, 2023 10:59 AM. If you have any questions, ask your nurse or doctor.          STOP taking these medications    triamcinolone cream 0.1 % Commonly known as: KENALOG Stopped by: Felix Pacini       TAKE these medications    ALPRAZolam 0.25 MG tablet Commonly known as: XANAX Take 1 tablet (0.25 mg total) by mouth at bedtime as needed for anxiety or sleep.   escitalopram 20 MG tablet Commonly known as: LEXAPRO Take 1 tablet (20 mg total) by mouth daily.   metoprolol succinate 25 MG 24 hr tablet Commonly known as: TOPROL-XL Take 1 tablet (25 mg total) by mouth daily.   trazodone 300 MG tablet Commonly known as: DESYREL Take 1 tablet (300 mg total) by mouth at bedtime as needed for sleep.        All past medical history, surgical history, allergies, family history, immunizations andmedications were updated in the EMR today and reviewed under the history and medication portions of their EMR.     No results found for this or any previous visit (from the past 2160 hours).  ROS 14 pt review of systems performed and negative (unless mentioned in an HPI)  Objective: BP 126/89   Pulse (!) 109   Ht 5' 2.6" (1.59 m)   Wt 143 lb 6.4 oz (65 kg)   SpO2 99%   BMI 25.73 kg/m  Physical Exam Vitals and nursing note reviewed.  Constitutional:      General: She is not in acute distress.    Appearance: Normal appearance. She is normal weight. She is not ill-appearing, toxic-appearing or diaphoretic.  HENT:     Head: Normocephalic and atraumatic.  Eyes:     General: No scleral icterus.       Right eye: No discharge.        Left eye: No discharge.     Extraocular Movements: Extraocular movements intact.     Conjunctiva/sclera: Conjunctivae normal.     Pupils: Pupils are equal, round, and reactive to light.  Cardiovascular:     Rate and Rhythm: Normal rate and regular rhythm.  Pulmonary:     Effort: Pulmonary effort is normal. No  respiratory distress.     Breath sounds: Normal breath sounds. No wheezing, rhonchi or rales.  Musculoskeletal:     Right  lower leg: No edema.     Left lower leg: No edema.  Skin:    General: Skin is warm.     Findings: No rash.  Neurological:     Mental Status: She is alert and oriented to person, place, and time. Mental status is at baseline.     Motor: No weakness.     Coordination: Coordination normal.     Gait: Gait normal.  Psychiatric:        Mood and Affect: Mood normal.        Behavior: Behavior normal.        Thought Content: Thought content normal.        Judgment: Judgment normal.    No results found.  Assessment/plan: Vickie Bennett is a 48 y.o. female present for Chronic Conditions/illness Management Generalized anxiety disorder/insomnia/Benzodiazepine contract exists Stable Continue Lexapro 20 mg daily Continue  Xanax daily as needed Continue  trazodone 300 mg nightly [place] controlled substance database reviewed  07/22/23   Tachycardia Stable Continue  metoprolol 25 mg daily  Labs due next visit.   Return in about 7 months (around 02/07/2024) for cpe (20 min), Routine chronic condition follow-up. Sooner if needing refills.   No orders of the defined types were placed in this encounter.    Meds ordered this encounter  Medications   ALPRAZolam (XANAX) 0.25 MG tablet    Sig: Take 1 tablet (0.25 mg total) by mouth at bedtime as needed for anxiety or sleep.    Dispense:  90 tablet    Refill:  1   escitalopram (LEXAPRO) 20 MG tablet    Sig: Take 1 tablet (20 mg total) by mouth daily.    Dispense:  90 tablet    Refill:  1   metoprolol succinate (TOPROL-XL) 25 MG 24 hr tablet    Sig: Take 1 tablet (25 mg total) by mouth daily.    Dispense:  90 tablet    Refill:  1   trazodone (DESYREL) 300 MG tablet    Sig: Take 1 tablet (300 mg total) by mouth at bedtime as needed for sleep.    Dispense:  90 tablet    Refill:  1   Referral Orders  No  referral(s) requested today     Electronically signed by: Felix Pacini, DO [facility] [place]

## 2023-07-29 ENCOUNTER — Ambulatory Visit (AMBULATORY_SURGERY_CENTER): Payer: 59

## 2023-07-29 ENCOUNTER — Ambulatory Visit: Payer: 59 | Admitting: Family Medicine

## 2023-07-29 VITALS — Ht 63.0 in | Wt 143.0 lb

## 2023-07-29 DIAGNOSIS — Z1211 Encounter for screening for malignant neoplasm of colon: Secondary | ICD-10-CM

## 2023-07-29 MED ORDER — SUFLAVE 178.7 G PO SOLR: 1.0000 | Freq: Once | ORAL | 0 refills | Status: AC

## 2023-07-29 NOTE — Progress Notes (Signed)

## 2023-08-14 ENCOUNTER — Encounter: Payer: Self-pay | Admitting: Internal Medicine

## 2023-08-19 ENCOUNTER — Encounter: Payer: Self-pay | Admitting: Internal Medicine

## 2023-08-19 ENCOUNTER — Ambulatory Visit (AMBULATORY_SURGERY_CENTER): Payer: 59 | Admitting: Internal Medicine

## 2023-08-19 VITALS — BP 102/45 | HR 71 | Temp 98.1°F | Resp 14 | Ht 63.0 in | Wt 143.0 lb

## 2023-08-19 DIAGNOSIS — K5730 Diverticulosis of large intestine without perforation or abscess without bleeding: Secondary | ICD-10-CM | POA: Diagnosis not present

## 2023-08-19 DIAGNOSIS — Z1211 Encounter for screening for malignant neoplasm of colon: Secondary | ICD-10-CM | POA: Diagnosis present

## 2023-08-19 MED ORDER — SODIUM CHLORIDE 0.9 % IV SOLN: 500.0000 mL | Freq: Once | INTRAVENOUS | Status: DC

## 2023-08-19 NOTE — Op Note (Signed)
 [facility] Patient Name: Vickie Bennett Procedure Date: 08/19/2023 7:21 AM MRN: [id number] Endoscopist: Wilhemina Bonito. Marina Goodell , MD, [id number] Age: 48 Referring MD:  Date of Birth: 03/15/1976 Gender: Female Account #: [id number] Procedure:                Colonoscopy Indications:              Screening for colorectal malignant neoplasm Medicines:                Monitored Anesthesia Care Procedure:                Pre-Anesthesia Assessment:                           - Prior to the procedure, a History and Physical                            was performed, and patient medications and                            allergies were reviewed. The patient's tolerance of                            previous anesthesia was also reviewed. The risks                            and benefits of the procedure and the sedation                            options and risks were discussed with the patient.                            All questions were answered, and informed consent                            was obtained. Prior Anticoagulants: The patient has                            taken no anticoagulant or antiplatelet agents. ASA                            Grade Assessment: II - A patient with mild systemic                            disease. After reviewing the risks and benefits,                            the patient was deemed in satisfactory condition to                            undergo the procedure.                           After obtaining informed consent, the colonoscope  was passed under direct vision. Throughout the                            procedure, the patient's blood pressure, pulse, and                            oxygen saturations were monitored continuously. The                            CF [id number] #[id number] was introduced through the anus                            and advanced to the the cecum, identified by                            appendiceal  orifice and ileocecal valve. The                            ileocecal valve, appendiceal orifice, and rectum                            were photographed. The quality of the bowel                            preparation was excellent. The colonoscopy was                            performed without difficulty. The patient tolerated                            the procedure well. The bowel preparation used was                            SUPREP via split dose instruction. Scope In: 8:22:01 AM Scope Out: 8:34:28 AM Scope Withdrawal Time: 0 hours 9 minutes 7 seconds  Total Procedure Duration: 0 hours 12 minutes 27 seconds  Findings:                 Diverticula were found in the sigmoid colon. The IC                            valve was prominent.                           The exam was otherwise without abnormality on                            direct and retroflexion views. Complications:            No immediate complications. Estimated blood loss:                            None. Estimated Blood Loss:     Estimated blood loss: none. Impression:               - Diverticulosis  in the sigmoid colon.                           - The examination was otherwise normal on direct                            and retroflexion views.                           - No specimens collected. Recommendation:           - Repeat colonoscopy in 10 years for screening                            purposes.                           - Patient has a contact number available for                            emergencies. The signs and symptoms of potential                            delayed complications were discussed with the                            patient. Return to normal activities tomorrow.                            Written discharge instructions were provided to the                            patient.                           - Resume previous diet.                           - Continue present  medications. Wilhemina Bonito. Marina Goodell, MD 08/19/2023 8:58:24 AM This report has been signed electronically.

## 2023-08-19 NOTE — Progress Notes (Signed)
 Pt's states no medical or surgical changes since previsit or office visit.

## 2023-08-19 NOTE — Progress Notes (Signed)
 Vss nad trans to pacu

## 2023-08-19 NOTE — Patient Instructions (Addendum)
 Thank you for letting us care for your healthcare needs today! Please see handout regarding Diverticulosis.  YOU HAD AN ENDOSCOPIC PROCEDURE TODAY AT THE [facility]:   Refer to the procedure report that was given to you for any specific questions about what was found during the examination.  If the procedure report does not answer your questions, please call your gastroenterologist to clarify.  If you requested that your care partner not be given the details of your procedure findings, then the procedure report has been included in a sealed envelope for you to review at your convenience later.  YOU SHOULD EXPECT: Some feelings of bloating in the abdomen. Passage of more gas than usual.  Walking can help get rid of the air that was put into your GI tract during the procedure and reduce the bloating. If you had a lower endoscopy (such as a colonoscopy or flexible sigmoidoscopy) you may notice spotting of blood in your stool or on the toilet paper. If you underwent a bowel prep for your procedure, you may not have a normal bowel movement for a few days.  Please Note:  You might notice some irritation and congestion in your nose or some drainage.  This is from the oxygen used during your procedure.  There is no need for concern and it should clear up in a day or so.  SYMPTOMS TO REPORT IMMEDIATELY:  Following lower endoscopy (colonoscopy or flexible sigmoidoscopy):  Excessive amounts of blood in the stool  Significant tenderness or worsening of abdominal pains  Swelling of the abdomen that is new, acute  Fever of 100F or higher  For urgent or emergent issues, a gastroenterologist can be reached at any hour by calling [phone]. Do not use MyChart messaging for urgent concerns.    DIET:  We do recommend a small meal at first, but then you may proceed to your regular diet.  Drink plenty of fluids but you should avoid alcoholic beverages for 24 hours.  ACTIVITY:  You should plan to  take it easy for the rest of today and you should NOT DRIVE or use heavy machinery until tomorrow (because of the sedation medicines used during the test).    FOLLOW UP: Our staff will call the number listed on your records the next business day following your procedure.  We will call around 7:15- 8:00 am to check on you and address any questions or concerns that you may have regarding the information given to you following your procedure. If we do not reach you, we will leave a message.     If any biopsies were taken you will be contacted by phone or by letter within the next 1-3 weeks.  Please call us at [phone] if you have not heard about the biopsies in 3 weeks.    SIGNATURES/CONFIDENTIALITY: You and/or your care partner have signed paperwork which will be entered into your electronic medical record.  These signatures attest to the fact that that the information above on your After Visit Summary has been reviewed and is understood.  Full responsibility of the confidentiality of this discharge information lies with you and/or your care-partner.

## 2023-08-19 NOTE — Progress Notes (Signed)
 HISTORY OF PRESENT ILLNESS:  Vickie Bennett is a 48 y.o. female who presents today for routine screening colonoscopy.  No complaints  REVIEW OF SYSTEMS:  All non-GI ROS negative except for  Past Medical History:  Diagnosis Date   Anxiety    COVID-19 virus infection 01/28/2023   Hernia of anterior abdominal wall 06/20/2014   History of kidney stones    Hyperlipidemia    Tachycardia 04/21/2010   Qualifier: Diagnosis of  By: Vickie Greenspan MD, Vickie Bennett     Ureteral calculus, left     Past Surgical History:  Procedure Laterality Date   CERVICAL BIOPSY  W/ LOOP ELECTRODE EXCISION  05-28-2003   dr Vickie Bennett [facility]   CESAREAN SECTION  10-12-2005  dr Vickie Bennett [facility]   DILATION AND CURETTAGE OF UTERUS  06-06-2004  dr Vickie Bennett [facility]   w/ suction for missed ab   MANDIBLE SURGERY Bilateral age 26   b/l TMJ w/screws in place    Social History Vickie Bennett  reports that she quit smoking about 14 years ago. Her smoking use included cigarettes. She has never used smokeless tobacco. She reports current alcohol use. She reports that she does not use drugs.  family history includes Alzheimer's disease in her paternal grandmother; Anemia in her maternal grandmother; Arthritis in her father; Heart disease in her maternal grandfather and paternal grandfather; Hypertension in her father; Interstitial cystitis in her mother; Melanoma in her mother; Migraines in her mother; Other in her paternal grandmother; Ulcers in her maternal grandmother.  Allergies  Allergen Reactions   Sulfonamide Derivatives Nausea Only       PHYSICAL EXAMINATION: Vital signs: BP 108/65   Pulse 88   Temp 98.1 F (36.7 C)   Resp 10   Ht 5\' 3"  (1.6 m)   Wt 143 lb (64.9 kg)   LMP 08/05/2023 (Exact Date)   SpO2 100%   BMI 25.33 kg/m  General: Well-developed, well-nourished, no acute distress HEENT: Sclerae are anicteric, conjunctiva pink. Oral mucosa intact Lungs: Clear Heart: Regular Abdomen: soft, nontender, nondistended, no  obvious ascites, no peritoneal signs, normal bowel sounds. No organomegaly. Extremities: No edema Psychiatric: alert and oriented x3. Cooperative     ASSESSMENT:   Colon cancer screening  PLAN:  Screening colonoscopy

## 2023-08-20 ENCOUNTER — Telehealth: Payer: Self-pay

## 2023-08-20 NOTE — Telephone Encounter (Signed)
 Left message on answering machine.

## 2023-09-16 ENCOUNTER — Ambulatory Visit (INDEPENDENT_AMBULATORY_CARE_PROVIDER_SITE_OTHER): Admitting: Family Medicine

## 2023-09-16 ENCOUNTER — Encounter: Payer: Self-pay | Admitting: Family Medicine

## 2023-09-16 VITALS — BP 114/80 | HR 84 | Temp 98.2°F | Wt 145.0 lb

## 2023-09-16 DIAGNOSIS — R7989 Other specified abnormal findings of blood chemistry: Secondary | ICD-10-CM | POA: Diagnosis not present

## 2023-09-16 DIAGNOSIS — N921 Excessive and frequent menstruation with irregular cycle: Secondary | ICD-10-CM | POA: Diagnosis not present

## 2023-09-16 DIAGNOSIS — N926 Irregular menstruation, unspecified: Secondary | ICD-10-CM

## 2023-09-16 NOTE — Progress Notes (Signed)
 Vickie Bennett , 06/12/1975, 48 y.o., female MRN: [id number] Patient Care Team    Relationship Specialty Notifications Start End  Mariel Shope, DO PCP - General Family Medicine  12/24/14   Macdonald Savoy, MD Referring Physician Obstetrics and Gynecology  12/26/15   Tobin Forts, MD Consulting Physician Gastroenterology  07/22/23     Chief Complaint  Patient presents with   Vaginal Bleeding    LMP: around 3/17 Pt states after her known LMP, she has been experiencing more bleeding, more often. Notices a heavier flow than usual. Cramping, denies any other menstrual symptoms.      Subjective: Vickie Bennett is a 48 y.o. Pt presents for an OV with complaints of changes in menstrual cycles. Pt reports she had her normal cycle 3/17, then had break through bleeding, heavy cycle , then spotting 1 week after.  Patient reports are menstrual cycles are getting shorter and shorter over the last 6 months, typically she had a menstrual cycle every 28-30 days, now she would have a menstrual cycle approximately every 21 days, until the above recent changes. Associated symptoms include heavier than her normal cycle flow and significant cramping.  She has had abnormal TSH's in the past, never required medication TSH and returned to normal. Her mother had started menopause at 76.     07/22/2023   10:46 AM 02/06/2023    2:02 PM 04/02/2022   11:54 AM 01/16/2022    8:13 AM 07/07/2021    3:19 PM  Depression screen PHQ 2/9  Decreased Interest 0 0 0 0 0  Down, Depressed, Hopeless 0 0 0 0 0  PHQ - 2 Score 0 0 0 0 0  Altered sleeping     0  Tired, decreased energy     1  Change in appetite     1  Feeling bad or failure about yourself      0  Trouble concentrating     0  Moving slowly or fidgety/restless     0  Suicidal thoughts     0  PHQ-9 Score     2    Allergies  Allergen Reactions   Sulfonamide Derivatives Nausea Only   Social History   Social History Narrative   Not on file    Past Medical History:  Diagnosis Date   Anxiety    COVID-19 virus infection 01/28/2023   Hernia of anterior abdominal wall 06/20/2014   History of kidney stones    Hyperlipidemia    Tachycardia 04/21/2010   Qualifier: Diagnosis of  By: Rodrick Clapper MD, Stacey     Ureteral calculus, left    Past Surgical History:  Procedure Laterality Date   CERVICAL BIOPSY  W/ LOOP ELECTRODE EXCISION  05-28-2003   dr Colvin [facility]   CESAREAN SECTION  10-12-2005  dr Melven Stable. [facility]   DILATION AND CURETTAGE OF UTERUS  06-06-2004  dr aAvanell [facility]   w/ suction for missed ab   MANDIBLE SURGERY Bilateral Bennett 60   b/l TMJ w/screws in place   Family History  Problem Relation Bennett of Onset   Migraines Mother    Interstitial cystitis Mother    Melanoma Mother        back   Hypertension Father    Arthritis Father    Ulcers Maternal Grandmother        Peptic   Anemia Maternal Grandmother    Heart disease Maternal Grandfather    Alzheimer's disease Paternal Grandmother  Other Paternal Grandmother        Possible PE   Heart disease Paternal Grandfather    Colon cancer Neg Hx    Rectal cancer Neg Hx    Stomach cancer Neg Hx    Esophageal cancer Neg Hx    Allergies as of 09/16/2023       Reactions   Sulfonamide Derivatives Nausea Only        Medication List        Accurate as of September 16, 2023  2:49 PM. If you have any questions, ask your nurse or doctor.          ALPRAZolam  0.25 MG tablet Commonly known as: XANAX  Take 1 tablet (0.25 mg total) by mouth at bedtime as needed for anxiety or sleep.   escitalopram  20 MG tablet Commonly known as: LEXAPRO  Take 1 tablet (20 mg total) by mouth daily.   metoprolol  succinate 25 MG 24 hr tablet Commonly known as: TOPROL -XL Take 1 tablet (25 mg total) by mouth daily.   trazodone  300 MG tablet Commonly known as: DESYREL  Take 1 tablet (300 mg total) by mouth at bedtime as needed for sleep.        All past medical history, surgical history,  allergies, family history, immunizations andmedications were updated in the EMR today and reviewed under the history and medication portions of their EMR.     ROS Negative, with the exception of above mentioned in HPI   Objective:  BP 114/80   Pulse 84   Temp 98.2 F (36.8 C)   Wt 145 lb (65.8 kg)   LMP 08/05/2023 (Exact Date)   SpO2 97%   BMI 25.69 kg/m  Body mass index is 25.69 kg/m. Physical Exam Vitals and nursing note reviewed.  Constitutional:      General: She is not in acute distress.    Appearance: Normal appearance. She is normal weight. She is not ill-appearing or toxic-appearing.  HENT:     Head: Normocephalic and atraumatic.  Eyes:     General: No scleral icterus.       Right eye: No discharge.        Left eye: No discharge.     Extraocular Movements: Extraocular movements intact.     Conjunctiva/sclera: Conjunctivae normal.     Pupils: Pupils are equal, round, and reactive to light.  Abdominal:     General: Abdomen is flat. There is no distension.     Palpations: Abdomen is soft. There is no mass.     Tenderness: There is no abdominal tenderness. There is no guarding or rebound.  Skin:    Findings: No rash.  Neurological:     Mental Status: She is alert and oriented to person, place, and time. Mental status is at baseline.     Motor: No weakness.     Coordination: Coordination normal.     Gait: Gait normal.  Psychiatric:        Mood and Affect: Mood normal.        Behavior: Behavior normal.        Thought Content: Thought content normal.        Judgment: Judgment normal.      No results found. No results found. No results found for this or any previous visit (from the past 24 hours).  Assessment/Plan: Vickie Bennett is a 48 y.o. female present for OV for  Irregular menstrual cycle (Primary)/menorrhagia with irregular cycle Discussed differential diagnosis of perimenopause normal thyroid  disorder, fibroids versus abnormal  endometrial  thickening We will start with laboratory workup today and consider ultrasound if not found the thyroid  disorder. - TSH - T4, free - T3, free - FSH/LH - Estradiol  - CBC w/Diff - IBC + Ferritin Follow-up depending upon laboratory results +/- ultrasound results, would consider gynecology referral if necessary   Reviewed expectations re: course of current medical issues. Discussed self-management of symptoms. Outlined signs and symptoms indicating need for more acute intervention. Patient verbalized understanding and all questions were answered. Patient received an After-Visit Summary.    Orders Placed This Encounter  Procedures   TSH   T4, free   T3, free   FSH/LH   Estradiol    CBC w/Diff   IBC + Ferritin   No orders of the defined types were placed in this encounter.  Referral Orders  No referral(s) requested today     Note is dictated utilizing voice recognition software. Although note has been proof read prior to signing, occasional typographical errors still can be missed. If any questions arise, please do not hesitate to call for verification.   electronically signed by:  Napolean Backbone, DO  [facility] [place]

## 2023-09-17 LAB — T4, FREE: Free T4: 0.72 ng/dL (ref 0.60–1.60)

## 2023-09-17 LAB — CBC WITH DIFFERENTIAL/PLATELET
Basophils Absolute: 0.1 10*3/uL (ref 0.0–0.1)
Basophils Relative: 1.1 % (ref 0.0–3.0)
Eosinophils Absolute: 0.1 10*3/uL (ref 0.0–0.7)
Eosinophils Relative: 1.6 % (ref 0.0–5.0)
HCT: 42.2 % (ref 36.0–46.0)
Hemoglobin: 13.9 g/dL (ref 12.0–15.0)
Lymphocytes Relative: 34.5 % (ref 12.0–46.0)
Lymphs Abs: 1.8 10*3/uL (ref 0.7–4.0)
MCHC: 33 g/dL (ref 30.0–36.0)
MCV: 97.2 fl (ref 78.0–100.0)
Monocytes Absolute: 0.4 10*3/uL (ref 0.1–1.0)
Monocytes Relative: 8.1 % (ref 3.0–12.0)
Neutro Abs: 2.8 10*3/uL (ref 1.4–7.7)
Neutrophils Relative %: 54.7 % (ref 43.0–77.0)
Platelets: 202 10*3/uL (ref 150.0–400.0)
RBC: 4.34 Mil/uL (ref 3.87–5.11)
RDW: 13.2 % (ref 11.5–15.5)
WBC: 5.2 10*3/uL (ref 4.0–10.5)

## 2023-09-17 LAB — T3, FREE: T3, Free: 3 pg/mL (ref 2.3–4.2)

## 2023-09-17 LAB — IBC + FERRITIN
Ferritin: 72 ng/mL (ref 10.0–291.0)
Iron: 54 ug/dL (ref 42–145)
Saturation Ratios: 15.4 % — ABNORMAL LOW (ref 20.0–50.0)
TIBC: 351.4 ug/dL (ref 250.0–450.0)
Transferrin: 251 mg/dL (ref 212.0–360.0)

## 2023-09-17 LAB — FSH/LH
FSH: 28.7 m[IU]/mL
LH: 11.8 m[IU]/mL

## 2023-09-17 LAB — ESTRADIOL: Estradiol: 35 pg/mL

## 2023-09-17 LAB — TSH: TSH: 2.25 u[IU]/mL (ref 0.35–5.50)

## 2023-09-18 ENCOUNTER — Telehealth: Payer: Self-pay | Admitting: Family Medicine

## 2023-09-18 ENCOUNTER — Encounter: Payer: Self-pay | Admitting: Family Medicine

## 2023-09-18 DIAGNOSIS — N938 Other specified abnormal uterine and vaginal bleeding: Secondary | ICD-10-CM | POA: Insufficient documentation

## 2023-09-18 DIAGNOSIS — E611 Iron deficiency: Secondary | ICD-10-CM | POA: Insufficient documentation

## 2023-09-18 DIAGNOSIS — R7989 Other specified abnormal findings of blood chemistry: Secondary | ICD-10-CM | POA: Insufficient documentation

## 2023-09-18 NOTE — Telephone Encounter (Signed)
 Please call patient with instructions  Her iron levels are borderline low and her saturations are low.  I would recommend she start an over-the-counter iron supplement.  Such as iron polysaccharide 325 mg twice weekly.  She will likely need to do this while she is having increased frequency of bleeding with her menstrual cycles.  Her blood cell count levels are still in normal range and she is not yet anemic.  Her estrogen levels are low, still technically in the normal range but lower than I would have expected for her. Her LH/FSH hormone ratio is consistent with perimenopause.  I do recommend we get her to gynecology to further evaluate the dysfunctional uterine bleeding.  They will workup the uterine wall lining, possibly obtain ultrasound and discuss options for dysfunctional uterine bleeding.  I [facility] that [facility].  If she would prefer a different location, I would be happy to change the referral to that location.

## 2023-09-19 NOTE — Telephone Encounter (Signed)
 Spoke with patient regarding results/recommendations.

## 2024-01-09 ENCOUNTER — Ambulatory Visit
Admission: RE | Admit: 2024-01-09 | Discharge: 2024-01-09 | Disposition: A | Discharge status: Home / Self Care | Source: Ambulatory Visit | Attending: Family Medicine | Admitting: Family Medicine

## 2024-01-09 DIAGNOSIS — Z1231 Encounter for screening mammogram for malignant neoplasm of breast: Secondary | ICD-10-CM

## 2024-01-13 ENCOUNTER — Ambulatory Visit: Payer: Self-pay | Admitting: Family Medicine

## 2024-01-21 ENCOUNTER — Other Ambulatory Visit: Payer: Self-pay | Admitting: Family Medicine

## 2024-02-14 ENCOUNTER — Encounter: Payer: Self-pay | Admitting: Family Medicine

## 2024-02-14 ENCOUNTER — Ambulatory Visit (INDEPENDENT_AMBULATORY_CARE_PROVIDER_SITE_OTHER): Admitting: Family Medicine

## 2024-02-14 VITALS — BP 116/80 | HR 90 | Temp 98.3°F | Ht 63.0 in | Wt 146.2 lb

## 2024-02-14 DIAGNOSIS — Z79899 Other long term (current) drug therapy: Secondary | ICD-10-CM | POA: Diagnosis not present

## 2024-02-14 DIAGNOSIS — F411 Generalized anxiety disorder: Secondary | ICD-10-CM

## 2024-02-14 DIAGNOSIS — Z0000 Encounter for general adult medical examination without abnormal findings: Secondary | ICD-10-CM | POA: Diagnosis not present

## 2024-02-14 DIAGNOSIS — F5101 Primary insomnia: Secondary | ICD-10-CM

## 2024-02-14 DIAGNOSIS — Z1231 Encounter for screening mammogram for malignant neoplasm of breast: Secondary | ICD-10-CM

## 2024-02-14 DIAGNOSIS — Z23 Encounter for immunization: Secondary | ICD-10-CM | POA: Diagnosis not present

## 2024-02-14 DIAGNOSIS — R000 Tachycardia, unspecified: Secondary | ICD-10-CM | POA: Diagnosis not present

## 2024-02-14 DIAGNOSIS — Z1211 Encounter for screening for malignant neoplasm of colon: Secondary | ICD-10-CM | POA: Diagnosis not present

## 2024-02-14 DIAGNOSIS — Z13220 Encounter for screening for lipoid disorders: Secondary | ICD-10-CM

## 2024-02-14 DIAGNOSIS — Z131 Encounter for screening for diabetes mellitus: Secondary | ICD-10-CM | POA: Diagnosis not present

## 2024-02-14 DIAGNOSIS — R7989 Other specified abnormal findings of blood chemistry: Secondary | ICD-10-CM

## 2024-02-14 LAB — LIPID PANEL
Cholesterol: 139 mg/dL (ref 0–200)
HDL: 67.3 mg/dL (ref >39.00)
LDL Cholesterol: 58 mg/dL (ref 0–99)
NonHDL: 71.82
Total CHOL/HDL Ratio: 2
Triglycerides: 68 mg/dL (ref 0.0–149.0)
VLDL: 13.6 mg/dL (ref 0.0–40.0)

## 2024-02-14 LAB — COMPREHENSIVE METABOLIC PANEL WITH GFR
ALT: 15 U/L (ref 0–35)
AST: 15 U/L (ref 0–37)
Albumin: 4.6 g/dL (ref 3.5–5.2)
Alkaline Phosphatase: 71 U/L (ref 39–117)
BUN: 11 mg/dL (ref 6–23)
CO2: 29 meq/L (ref 19–32)
Calcium: 9.8 mg/dL (ref 8.4–10.5)
Chloride: 105 meq/L (ref 96–112)
Creatinine, Ser: 0.81 mg/dL (ref 0.40–1.20)
GFR: 87.18 mL/min (ref >60.00)
Glucose, Bld: 101 mg/dL — ABNORMAL HIGH (ref 70–99)
Potassium: 5.6 meq/L — ABNORMAL HIGH (ref 3.5–5.1)
Sodium: 140 meq/L (ref 135–145)
Total Bilirubin: 0.6 mg/dL (ref 0.2–1.2)
Total Protein: 6.8 g/dL (ref 6.0–8.3)

## 2024-02-14 LAB — HEMOGLOBIN A1C: Hgb A1c MFr Bld: 5.7 % (ref 4.6–6.5)

## 2024-02-14 LAB — CBC
HCT: 45.6 % (ref 36.0–46.0)
Hemoglobin: 14.8 g/dL (ref 12.0–15.0)
MCHC: 32.5 g/dL (ref 30.0–36.0)
MCV: 97.1 fl (ref 78.0–100.0)
Platelets: 205 K/uL (ref 150.0–400.0)
RBC: 4.7 Mil/uL (ref 3.87–5.11)
RDW: 12.9 % (ref 11.5–15.5)
WBC: 4.4 K/uL (ref 4.0–10.5)

## 2024-02-14 LAB — TSH: TSH: 3.29 u[IU]/mL (ref 0.35–5.50)

## 2024-02-14 MED ORDER — TRAZODONE HCL 300 MG PO TABS: 300.0000 mg | ORAL_TABLET | Freq: Every evening | ORAL | 1 refills | Status: AC | PRN

## 2024-02-14 MED ORDER — METOPROLOL SUCCINATE ER 25 MG PO TB24: 25.0000 mg | ORAL_TABLET | Freq: Every day | ORAL | 1 refills | Status: AC

## 2024-02-14 MED ORDER — ALPRAZOLAM 0.25 MG PO TABS: 0.2500 mg | ORAL_TABLET | Freq: Every evening | ORAL | 1 refills | Status: AC | PRN

## 2024-02-14 MED ORDER — ESCITALOPRAM OXALATE 20 MG PO TABS: 20.0000 mg | ORAL_TABLET | Freq: Every day | ORAL | 1 refills | Status: AC

## 2024-02-14 NOTE — Patient Instructions (Addendum)
 Return in about 24 weeks (around 07/31/2024) for Routine chronic condition follow-up.        Great to see you today.  I have refilled the medication(s) we provide.   If labs were collected or images ordered, we will inform you of  results once we have received them and reviewed. We will contact you either by echart message, or telephone call.  Please give ample time to the testing facility, and our office to run,  receive and review results. Please do not call inquiring of results, even if you can see them in your chart. We will contact you as soon as we are able. If it has been over 1 week since the test was completed, and you have not yet heard from us , then please call us .    - echart message- for normal results that have been seen by the patient already.   - telephone call: abnormal results or if patient has not viewed results in their echart.  If a referral to a specialist was entered for you, please call us  in 2 weeks if you have not heard from the specialist office to schedule.

## 2024-02-14 NOTE — Progress Notes (Signed)
 Patient ID: Vickie Bennett, female  DOB: 1976/05/11, 48 y.o.   MRN: [id number] Patient Care Team    Relationship Specialty Notifications Start End  Catherine Charlies LABOR, DO PCP - General Family Medicine  12/24/14   Lorelle Railing, MD Referring Physician Obstetrics and Gynecology  12/26/15   Abran Norleen SAILOR, MD Consulting Physician Gastroenterology  07/22/23     Chief Complaint  Patient presents with   Annual Exam    Pt is fasting.  Chronic condition management Influenza vaccine- given    Subjective: Vickie Bennett is a 48 y.o.  Female  present for CPE chronic Conditions/illness Management All past medical history, surgical history, allergies, family history, immunizations, medications and social history were updated in the electronic medical record today.  Health maintenance:  Colonoscopy: No fhx, colonoscopy completed 08/19/2023-repeat 10 years Mammogram: completed 01/09/2024 [facility]> order placed or 2026 Cervical cancer screening: last pap: 02/06/2023-5-year follow-up PCP Immunizations: tdap UTD 2022, Influenza-given(encouraged yearly) Infectious disease screening: HIV completed , Hep C completed DEXA: per routine screen 60-65 Patient has a Dental home. Hospitalizations/ED visits: Reviewed  tachycardia She reports her tachycardia and palpitations are well-controlled with metoprolol  25 mg daily.    Anxiety/insomnia She reports compliance with trazodone  300 mg nightly, Lexapro  20 mg daily and Xanax  0.25 mg daily as needed.  She feels regimen is working well for her     07/22/2023   10:46 AM 02/06/2023    2:02 PM 04/02/2022   11:54 AM 01/16/2022    8:13 AM 07/07/2021    3:19 PM  Depression screen PHQ 2/9  Decreased Interest 0 0 0 0 0  Down, Depressed, Hopeless 0 0 0 0 0  PHQ - 2 Score 0 0 0 0 0  Altered sleeping     0  Tired, decreased energy     1  Change in appetite     1  Feeling bad or failure about yourself      0  Trouble concentrating     0  Moving slowly or  fidgety/restless     0  Suicidal thoughts     0  PHQ-9 Score     2      07/22/2023   10:46 AM 02/06/2023    2:02 PM 07/07/2021    3:19 PM 01/13/2021   10:00 AM  GAD 7 : Generalized Anxiety Score  Nervous, Anxious, on Edge 1 1 0 1  Control/stop worrying 2 1 0 0  Worry too much - different things 1 1 0 0  Trouble relaxing 0 0 0 0  Restless 0 0 0 0  Easily annoyed or irritable 0 1 1 0  Afraid - awful might happen 1 1 0 1  Total GAD 7 Score 5 5 1 2   Anxiety Difficulty Not difficult at all Not difficult at all      Immunization History  Administered Date(s) Administered   HPV 9-valent 08/22/2017, 01/16/2022, 02/06/2023   Hpv-Unspecified 02/06/2023   Influenza Split 03/07/2011, 03/12/2012   Influenza, Seasonal, Injecte, Preservative Fre 05/04/2013, 02/06/2023, 02/14/2024   Influenza,inj,Quad PF,6+ Mos 05/04/2013, 03/02/2015, 05/01/2016, 05/01/2017, 04/02/2018, 01/13/2021, 01/16/2022   Influenza,inj,quad, With Preservative 03/01/2014   Influenza-Unspecified 04/20/2014   PFIZER(Purple Top)SARS-COV-2 Vaccination 01/02/2020, 01/27/2020   PPD Test 12/26/2015   Tdap 03/07/2011, 01/13/2021    Past Medical History:  Diagnosis Date   Anxiety    COVID-19 virus infection 01/28/2023   Hernia of anterior abdominal wall 06/20/2014   History of kidney stones    Hyperlipidemia  Nephrolithiasis 01/25/2016   Tachycardia 04/21/2010   Qualifier: Diagnosis of  By: Domenica MD, Stacey     Ureteral calculus, left    Allergies  Allergen Reactions   Sulfonamide Derivatives Nausea Only   Past Surgical History:  Procedure Laterality Date   CERVICAL BIOPSY  W/ LOOP ELECTRODE EXCISION  05-28-2003   dr nikki [facility]   CESAREAN SECTION  10-12-2005  dr christella. lenon [facility]   DILATION AND CURETTAGE OF UTERUS  06-06-2004  dr aSABRA gull [facility]   w/ suction for missed ab   MANDIBLE SURGERY Bilateral age 61   b/l TMJ w/screws in place   Family History  Problem Relation Age of Onset   Migraines Mother    Interstitial  cystitis Mother    Melanoma Mother        back   Hypertension Father    Arthritis Father    Ulcers Maternal Grandmother        Peptic   Anemia Maternal Grandmother    Heart disease Maternal Grandfather    Alzheimer's disease Paternal Grandmother    Other Paternal Grandmother        Possible PE   Heart disease Paternal Grandfather    Colon cancer Neg Hx    Rectal cancer Neg Hx    Stomach cancer Neg Hx    Esophageal cancer Neg Hx    Breast cancer Neg Hx    BRCA 1/2 Neg Hx    Social History   Social History Narrative   Not on file    Allergies as of 02/14/2024       Reactions   Sulfonamide Derivatives Nausea Only        Medication List        Accurate as of February 14, 2024  8:29 AM. If you have any questions, ask your nurse or doctor.          ALPRAZolam  0.25 MG tablet Commonly known as: XANAX  Take 1 tablet (0.25 mg total) by mouth at bedtime as needed for anxiety or sleep.   escitalopram  20 MG tablet Commonly known as: LEXAPRO  Take 1 tablet (20 mg total) by mouth daily.   metoprolol  succinate 25 MG 24 hr tablet Commonly known as: TOPROL -XL Take 1 tablet (25 mg total) by mouth daily.   trazodone  300 MG tablet Commonly known as: DESYREL  Take 1 tablet (300 mg total) by mouth at bedtime as needed for sleep.        All past medical history, surgical history, allergies, family history, immunizations andmedications were updated in the EMR today and reviewed under the history and medication portions of their EMR.     No results found for this or any previous visit (from the past 2160 hours).  Review of Systems  All other systems reviewed and are negative.  14 pt review of systems performed and negative (unless mentioned in an HPI)  Objective: BP 116/80   Pulse 90   Temp 98.3 F (36.8 C)   Ht 5' 3 (1.6 m)   Wt 146 lb 3.2 oz (66.3 kg)   SpO2 97%   BMI 25.90 kg/m  Physical Exam Vitals and nursing note reviewed.  Constitutional:      General:  She is not in acute distress.    Appearance: Normal appearance. She is normal weight. She is not ill-appearing, toxic-appearing or diaphoretic.  HENT:     Head: Normocephalic and atraumatic.     Right Ear: Tympanic membrane, ear canal and external ear normal. There is no  impacted cerumen.     Left Ear: Tympanic membrane, ear canal and external ear normal. There is no impacted cerumen.     Nose: No congestion or rhinorrhea.     Mouth/Throat:     Mouth: Mucous membranes are moist.     Pharynx: Oropharynx is clear. No oropharyngeal exudate or posterior oropharyngeal erythema.  Eyes:     General: No scleral icterus.       Right eye: No discharge.        Left eye: No discharge.     Extraocular Movements: Extraocular movements intact.     Conjunctiva/sclera: Conjunctivae normal.     Pupils: Pupils are equal, round, and reactive to light.  Cardiovascular:     Rate and Rhythm: Normal rate and regular rhythm.     Pulses: Normal pulses.     Heart sounds: Normal heart sounds. No murmur heard.    No friction rub. No gallop.  Pulmonary:     Effort: Pulmonary effort is normal. No respiratory distress.     Breath sounds: Normal breath sounds. No stridor. No wheezing, rhonchi or rales.  Chest:     Chest wall: No tenderness.  Abdominal:     General: Abdomen is flat. Bowel sounds are normal. There is no distension.     Palpations: Abdomen is soft. There is no mass.     Tenderness: There is no abdominal tenderness. There is no right CVA tenderness, left CVA tenderness, guarding or rebound.     Hernia: No hernia is present.  Musculoskeletal:        General: No swelling, tenderness or deformity. Normal range of motion.     Cervical back: Normal range of motion and neck supple. No rigidity or tenderness.     Right lower leg: No edema.     Left lower leg: No edema.  Lymphadenopathy:     Cervical: No cervical adenopathy.  Skin:    General: Skin is warm and dry.     Coloration: Skin is not jaundiced  or pale.     Findings: No bruising, erythema, lesion or rash.  Neurological:     General: No focal deficit present.     Mental Status: She is alert and oriented to person, place, and time. Mental status is at baseline.     Cranial Nerves: No cranial nerve deficit.     Sensory: No sensory deficit.     Motor: No weakness.     Coordination: Coordination normal.     Gait: Gait normal.     Deep Tendon Reflexes: Reflexes normal.  Psychiatric:        Mood and Affect: Mood normal.        Behavior: Behavior normal.        Thought Content: Thought content normal.        Judgment: Judgment normal.    No results found.  Assessment/plan: Vickie Bennett is a 48 y.o. female present for CPE and chronic Conditions/illness Management Generalized anxiety disorder/insomnia/Benzodiazepine contract exists Stable Continue Lexapro  20 mg daily Continue Xanax  daily as needed Continue trazodone  300 mg nightly [place]  controlled substance database reviewed  02/14/24   Tachycardia Stable Continue metoprolol  25 mg daily    Influenza vaccine needed - Flu vaccine trivalent PF, 6mos and older(Flulaval,Afluria,Fluarix,Fluzone) Breast cancer screening by mammogram - MM 3D SCREENING MAMMOGRAM BILATERAL BREAST; Future Lipid screening - Lipid panel Diabetes mellitus screening - Hemoglobin A1c  Routine general medical examination at a health care facility (Primary) - CBC - Comprehensive metabolic  panel with GFR - TSH Patient was encouraged to exercise greater than 150 minutes a week. Patient was encouraged to choose a diet filled with fresh fruits and vegetables, and lean meats. AVS provided to patient today for education/recommendation on gender specific health and safety maintenance. Colonoscopy: No fhx, colonoscopy completed 08/19/2023-repeat 10 years Mammogram: completed 01/09/2024 [facility]> order placed or 2026 Cervical cancer screening: last pap: 02/06/2023-5-year follow-up PCP Immunizations:  tdap UTD 2022, Influenza-given(encouraged yearly) Infectious disease screening: HIV completed , Hep C completed DEXA: per routine screen 60-65  Return in about 24 weeks (around 07/31/2024) for Routine chronic condition follow-up. Sooner if needing refills.   Orders Placed This Encounter  Procedures   MM 3D SCREENING MAMMOGRAM BILATERAL BREAST   Flu vaccine trivalent PF, 6mos and older(Flulaval,Afluria,Fluarix,Fluzone)   CBC   Comprehensive metabolic panel with GFR   Hemoglobin A1c   Lipid panel   TSH     Meds ordered this encounter  Medications   ALPRAZolam  (XANAX ) 0.25 MG tablet    Sig: Take 1 tablet (0.25 mg total) by mouth at bedtime as needed for anxiety or sleep.    Dispense:  90 tablet    Refill:  1   escitalopram  (LEXAPRO ) 20 MG tablet    Sig: Take 1 tablet (20 mg total) by mouth daily.    Dispense:  90 tablet    Refill:  1   metoprolol  succinate (TOPROL -XL) 25 MG 24 hr tablet    Sig: Take 1 tablet (25 mg total) by mouth daily.    Dispense:  90 tablet    Refill:  1   trazodone  (DESYREL ) 300 MG tablet    Sig: Take 1 tablet (300 mg total) by mouth at bedtime as needed for sleep.    Dispense:  90 tablet    Refill:  1   Referral Orders  No referral(s) requested today     Electronically signed by: Charlies Bellini, DO [facility] [place]

## 2024-02-17 ENCOUNTER — Ambulatory Visit: Payer: Self-pay | Admitting: Family Medicine

## 2024-02-17 DIAGNOSIS — E875 Hyperkalemia: Secondary | ICD-10-CM | POA: Insufficient documentation

## 2024-02-20 ENCOUNTER — Encounter: Admitting: Family Medicine

## 2024-02-26 ENCOUNTER — Other Ambulatory Visit (INDEPENDENT_AMBULATORY_CARE_PROVIDER_SITE_OTHER)

## 2024-02-26 ENCOUNTER — Ambulatory Visit: Payer: Self-pay | Admitting: Family Medicine

## 2024-02-26 DIAGNOSIS — E875 Hyperkalemia: Secondary | ICD-10-CM

## 2024-02-26 LAB — BASIC METABOLIC PANEL WITH GFR
BUN: 20 mg/dL (ref 6–23)
CO2: 26 meq/L (ref 19–32)
Calcium: 9.2 mg/dL (ref 8.4–10.5)
Chloride: 103 meq/L (ref 96–112)
Creatinine, Ser: 0.9 mg/dL (ref 0.40–1.20)
GFR: 76.81 mL/min (ref >60.00)
Glucose, Bld: 98 mg/dL (ref 70–99)
Potassium: 4.9 meq/L (ref 3.5–5.1)
Sodium: 138 meq/L (ref 135–145)

## 2024-02-27 ENCOUNTER — Other Ambulatory Visit
